# Patient Record
Sex: Female | Born: 1945
Health system: Southern US, Community
[De-identification: ages and names within clinical notes are randomized; demographics above are authoritative.]

## PROBLEM LIST (undated history)

## (undated) DIAGNOSIS — G43909 Migraine, unspecified, not intractable, without status migrainosus: Secondary | ICD-10-CM

## (undated) DIAGNOSIS — J189 Pneumonia, unspecified organism: Secondary | ICD-10-CM

## (undated) DIAGNOSIS — M1990 Unspecified osteoarthritis, unspecified site: Secondary | ICD-10-CM

## (undated) DIAGNOSIS — E0590 Thyrotoxicosis, unspecified without thyrotoxic crisis or storm: Secondary | ICD-10-CM

## (undated) DIAGNOSIS — R51 Headache: Secondary | ICD-10-CM

## (undated) DIAGNOSIS — Z8701 Personal history of pneumonia (recurrent): Secondary | ICD-10-CM

## (undated) DIAGNOSIS — Z8719 Personal history of other diseases of the digestive system: Secondary | ICD-10-CM

## (undated) DIAGNOSIS — C801 Malignant (primary) neoplasm, unspecified: Secondary | ICD-10-CM

## (undated) HISTORY — PX: ANKLE SURGERY: SHX546

## (undated) HISTORY — DX: Migraine, unspecified, not intractable, without status migrainosus: G43.909

## (undated) HISTORY — PX: BREAST EXCISIONAL BIOPSY: SUR124

## (undated) HISTORY — DX: Personal history of pneumonia (recurrent): Z87.01

## (undated) HISTORY — PX: MOUTH SURGERY: SHX715

## (undated) HISTORY — PX: CATARACT EXTRACTION, BILATERAL: SHX1313

## (undated) HISTORY — PX: OTHER SURGICAL HISTORY: SHX169

---

## 2003-03-27 HISTORY — PX: CHOLECYSTECTOMY: SHX55

## 2005-03-16 ENCOUNTER — Ambulatory Visit: Payer: Self-pay | Admitting: Internal Medicine

## 2005-04-09 ENCOUNTER — Ambulatory Visit: Payer: Self-pay | Admitting: Internal Medicine

## 2005-04-09 ENCOUNTER — Ambulatory Visit (HOSPITAL_COMMUNITY): Admission: RE | Admit: 2005-04-09 | Discharge: 2005-04-09 | Payer: Self-pay | Admitting: Internal Medicine

## 2007-12-25 ENCOUNTER — Ambulatory Visit: Payer: Self-pay | Admitting: Cardiology

## 2008-02-06 ENCOUNTER — Ambulatory Visit: Payer: Self-pay | Admitting: Cardiology

## 2011-08-03 ENCOUNTER — Encounter (INDEPENDENT_AMBULATORY_CARE_PROVIDER_SITE_OTHER): Payer: Self-pay | Admitting: *Deleted

## 2011-09-01 ENCOUNTER — Emergency Department: Payer: Self-pay | Admitting: Emergency Medicine

## 2013-09-11 ENCOUNTER — Encounter (INDEPENDENT_AMBULATORY_CARE_PROVIDER_SITE_OTHER): Payer: Self-pay | Admitting: *Deleted

## 2015-02-03 ENCOUNTER — Other Ambulatory Visit (INDEPENDENT_AMBULATORY_CARE_PROVIDER_SITE_OTHER): Payer: Self-pay | Admitting: *Deleted

## 2015-02-03 DIAGNOSIS — Z1211 Encounter for screening for malignant neoplasm of colon: Secondary | ICD-10-CM

## 2015-02-03 DIAGNOSIS — Z800 Family history of malignant neoplasm of digestive organs: Secondary | ICD-10-CM

## 2015-04-06 ENCOUNTER — Telehealth (INDEPENDENT_AMBULATORY_CARE_PROVIDER_SITE_OTHER): Payer: Self-pay | Admitting: *Deleted

## 2015-04-06 DIAGNOSIS — Z1211 Encounter for screening for malignant neoplasm of colon: Secondary | ICD-10-CM

## 2015-04-06 NOTE — Telephone Encounter (Signed)
Referring MD/PCP: bluth   Procedure: tcs  Reason/Indication:  Screening, fam hx colon ca  Has patient had this procedure before?  no  If so, when, by whom and where?    Is there a family history of colon cancer?  Yes, hald brother  Who?  What age when diagnosed?    Is patient diabetic?   no      Does patient have prosthetic heart valve or mechanical valve?  no  Do you have a pacemaker?  no  Has patient ever had endocarditis? no  Has patient had joint replacement within last 12 months?  no  Does patient tend to be constipated or take laxatives? no  Does patient have a history of alcohol/drug use?  no  Is patient on Coumadin, Plavix and/or Aspirin? ues  Medications: asa prn, synthroid 50 mcg daily  Allergies: sulfur drugs  Medication Adjustment: asa 2 days  Procedure date & time: 04/28/15 at 730

## 2015-04-06 NOTE — Telephone Encounter (Signed)
Patient needs trilyte 

## 2015-04-07 MED ORDER — PEG 3350-KCL-NA BICARB-NACL 420 G PO SOLR: 4000.0000 mL | Freq: Once | ORAL | Status: DC

## 2015-04-07 NOTE — Telephone Encounter (Signed)
agree

## 2015-04-28 ENCOUNTER — Encounter (HOSPITAL_COMMUNITY)
Admission: RE | Disposition: A | Discharge status: Home Health | Payer: Self-pay | Source: Ambulatory Visit | Attending: Internal Medicine

## 2015-04-28 ENCOUNTER — Encounter (HOSPITAL_COMMUNITY): Payer: Self-pay

## 2015-04-28 ENCOUNTER — Ambulatory Visit (HOSPITAL_COMMUNITY)
Admission: RE | Admit: 2015-04-28 | Discharge: 2015-04-28 | Disposition: A | Discharge status: Home Health | Payer: Medicare Other | Source: Ambulatory Visit | Attending: Internal Medicine | Admitting: Internal Medicine

## 2015-04-28 DIAGNOSIS — Z800 Family history of malignant neoplasm of digestive organs: Secondary | ICD-10-CM | POA: Diagnosis not present

## 2015-04-28 DIAGNOSIS — Z1211 Encounter for screening for malignant neoplasm of colon: Secondary | ICD-10-CM | POA: Diagnosis present

## 2015-04-28 DIAGNOSIS — E785 Hyperlipidemia, unspecified: Secondary | ICD-10-CM | POA: Diagnosis not present

## 2015-04-28 DIAGNOSIS — K5730 Diverticulosis of large intestine without perforation or abscess without bleeding: Secondary | ICD-10-CM | POA: Insufficient documentation

## 2015-04-28 DIAGNOSIS — E0590 Thyrotoxicosis, unspecified without thyrotoxic crisis or storm: Secondary | ICD-10-CM | POA: Insufficient documentation

## 2015-04-28 DIAGNOSIS — Z79899 Other long term (current) drug therapy: Secondary | ICD-10-CM | POA: Insufficient documentation

## 2015-04-28 HISTORY — DX: Headache: R51

## 2015-04-28 HISTORY — PX: COLONOSCOPY: SHX5424

## 2015-04-28 HISTORY — DX: Thyrotoxicosis, unspecified without thyrotoxic crisis or storm: E05.90

## 2015-04-28 SURGERY — COLONOSCOPY
Anesthesia: Moderate Sedation

## 2015-04-28 MED ORDER — MEPERIDINE HCL 50 MG/ML IJ SOLN
INTRAMUSCULAR | Status: DC | PRN
  Administered 2015-04-28 (×2): 25 mg via INTRAVENOUS

## 2015-04-28 MED ORDER — STERILE WATER FOR IRRIGATION IR SOLN
Status: DC | PRN
  Administered 2015-04-28: 08:00:00

## 2015-04-28 MED ORDER — MEPERIDINE HCL 50 MG/ML IJ SOLN
INTRAMUSCULAR | Status: AC
  Filled 2015-04-28: qty 1

## 2015-04-28 MED ORDER — SODIUM CHLORIDE 0.9 % IV SOLN
INTRAVENOUS | Status: DC
  Administered 2015-04-28: 07:00:00 via INTRAVENOUS

## 2015-04-28 MED ORDER — MIDAZOLAM HCL 5 MG/5ML IJ SOLN
INTRAMUSCULAR | Status: DC | PRN
  Administered 2015-04-28 (×5): 2 mg via INTRAVENOUS

## 2015-04-28 MED ORDER — MIDAZOLAM HCL 5 MG/5ML IJ SOLN
INTRAMUSCULAR | Status: AC
  Filled 2015-04-28: qty 10

## 2015-04-28 NOTE — H&P (Signed)
Amber Rivas is an 70 y.o. female.   Chief Complaint: Patient is here for colonoscopy. HPI: She is 70 year old Caucasian female who is here for screening examination. She denies abdominal pain change in bowel habits or rectal bleeding. This is patient's first exam. Her brother was diagnosed with rectal carcinoma last year rate 69. He required preop chemoradiation followed by surgery. Rolled Brother has been treated for prostate carcinoma and is doing fine.  Past Medical History  Diagnosis Date  . Hyperlipemia   . Headache   . Hyperthyroidism     Past Surgical History  Procedure Laterality Date  . Cholecystectomy  2005    Family History  Problem Relation Age of Onset  . Colon cancer Brother    Social History:  reports that she has never smoked. She does not have any smokeless tobacco history on file. She reports that she drinks alcohol. She reports that she does not use illicit drugs.  Allergies:  Allergies  Allergen Reactions  . Sulfur Anaphylaxis    Medications Prior to Admission  Medication Sig Dispense Refill  . levothyroxine (SYNTHROID, LEVOTHROID) 50 MCG tablet Take 50 mcg by mouth daily.    . Multiple Vitamin tablet Take 1 tablet by mouth daily.    . Omega-3 Fatty Acids (OMEGA 3 PO) Take 1 capsule by mouth 2 (two) times daily.    . polyethylene glycol-electrolytes (NULYTELY/GOLYTELY) 420 g solution Take 4,000 mLs by mouth once. 4000 mL 0  . meloxicam (MOBIC) 15 MG tablet Take 15 mg by mouth daily as needed. arthritis      No results found for this or any previous visit (from the past 48 hour(s)). No results found.  ROS  Blood pressure 144/77, pulse 82, temperature 98.6 F (37 C), temperature source Oral, resp. rate 14, height 5' 4.5" (1.638 m), weight 176 lb (79.833 kg), SpO2 97 %. Physical Exam  Constitutional: She appears well-developed and well-nourished.  HENT:  Mouth/Throat: Oropharynx is clear and moist.  Eyes: Conjunctivae are normal. No scleral icterus.   Neck: No thyromegaly present.  Cardiovascular: Normal rate, regular rhythm and normal heart sounds.   No murmur heard. Respiratory: Effort normal and breath sounds normal.  GI: Soft. She exhibits no distension and no mass. There is no tenderness.  Musculoskeletal: She exhibits no edema.  Lymphadenopathy:    She has no cervical adenopathy.  Neurological: She is alert.  Skin: Skin is warm and dry.     Assessment/Plan High risk screening colonoscopy. History of CRC in younger brother.  Rogene Houston, MD 04/28/2015, 7:36 AM

## 2015-04-28 NOTE — Discharge Instructions (Signed)
Resume usual medications and high fiber diet. No driving for 24 hours. Next screening exam in 5 years.  Colonoscopy, Care After Refer to this sheet in the next few weeks. These instructions provide you with information on caring for yourself after your procedure. Your health care provider may also give you more specific instructions. Your treatment has been planned according to current medical practices, but problems sometimes occur. Call your health care provider if you have any problems or questions after your procedure. WHAT TO EXPECT AFTER THE PROCEDURE  After your procedure, it is typical to have the following:  A small amount of blood in your stool.  Moderate amounts of gas and mild abdominal cramping or bloating. HOME CARE INSTRUCTIONS  Do not drive, operate machinery, or sign important documents for 24 hours.  You may shower and resume your regular physical activities, but move at a slower pace for the first 24 hours.  Take frequent rest periods for the first 24 hours.  Walk around or put a warm pack on your abdomen to help reduce abdominal cramping and bloating.  Drink enough fluids to keep your urine clear or pale yellow.  You may resume your normal diet as instructed by your health care provider. Avoid heavy or fried foods that are hard to digest.  Avoid drinking alcohol for 24 hours or as instructed by your health care provider.  Only take over-the-counter or prescription medicines as directed by your health care provider.  If a tissue sample (biopsy) was taken during your procedure:  Do not take aspirin or blood thinners for 7 days, or as instructed by your health care provider.  Do not drink alcohol for 7 days, or as instructed by your health care provider.  Eat soft foods for the first 24 hours. SEEK MEDICAL CARE IF: You have persistent spotting of blood in your stool 2-3 days after the procedure. SEEK IMMEDIATE MEDICAL CARE IF:  You have more than a small  spotting of blood in your stool.  You pass large blood clots in your stool.  Your abdomen is swollen (distended).  You have nausea or vomiting.  You have a fever.  You have increasing abdominal pain that is not relieved with medicine.   This information is not intended to replace advice given to you by your health care provider. Make sure you discuss any questions you have with your health care provider.   Diverticulosis Diverticulosis is the condition that develops when small pouches (diverticula) form in the wall of your colon. Your colon, or large intestine, is where water is absorbed and stool is formed. The pouches form when the inside layer of your colon pushes through weak spots in the outer layers of your colon. CAUSES  No one knows exactly what causes diverticulosis. RISK FACTORS  Being older than 72. Your risk for this condition increases with age. Diverticulosis is rare in people younger than 40 years. By age 18, almost everyone has it.  Eating a low-fiber diet.  Being frequently constipated.  Being overweight.  Not getting enough exercise.  Smoking.  Taking over-the-counter pain medicines, like aspirin and ibuprofen. SYMPTOMS  Most people with diverticulosis do not have symptoms. DIAGNOSIS  Because diverticulosis often has no symptoms, health care providers often discover the condition during an exam for other colon problems. In many cases, a health care provider will diagnose diverticulosis while using a flexible scope to examine the colon (colonoscopy). TREATMENT  If you have never developed an infection related to diverticulosis, you  may not need treatment. If you have had an infection before, treatment may include:  Eating more fruits, vegetables, and grains.  Taking a fiber supplement.  Taking a live bacteria supplement (probiotic).  Taking medicine to relax your colon. HOME CARE INSTRUCTIONS   Drink at least 6-8 glasses of water each day to prevent  constipation.  Try not to strain when you have a bowel movement.  Keep all follow-up appointments. If you have had an infection before:  Increase the fiber in your diet as directed by your health care provider or dietitian.  Take a dietary fiber supplement if your health care provider approves.  Only take medicines as directed by your health care provider. SEEK MEDICAL CARE IF:   You have abdominal pain.  You have bloating.  You have cramps.  You have not gone to the bathroom in 3 days. SEEK IMMEDIATE MEDICAL CARE IF:   Your pain gets worse.  Yourbloating becomes very bad.  You have a fever or chills, and your symptoms suddenly get worse.  You begin vomiting.  You have bowel movements that are bloody or black. MAKE SURE YOU:  Understand these instructions.  Will watch your condition.  Will get help right away if you are not doing well or get worse.   This information is not intended to replace advice given to you by your health care provider. Make sure you discuss any questions you have with your health care provider.   High-Fiber Diet Fiber, also called dietary fiber, is a type of carbohydrate found in fruits, vegetables, whole grains, and beans. A high-fiber diet can have many health benefits. Your health care provider may recommend a high-fiber diet to help: Prevent constipation. Fiber can make your bowel movements more regular. Lower your cholesterol. Relieve hemorrhoids, uncomplicated diverticulosis, or irritable bowel syndrome. Prevent overeating as part of a weight-loss plan. Prevent heart disease, type 2 diabetes, and certain cancers. WHAT IS MY PLAN? The recommended daily intake of fiber includes: 38 grams for men under age 63. 107 grams for men over age 40. 15 grams for women under age 84. 56 grams for women over age 13. You can get the recommended daily intake of dietary fiber by eating a variety of fruits, vegetables, grains, and beans. Your health  care provider may also recommend a fiber supplement if it is not possible to get enough fiber through your diet. WHAT DO I NEED TO KNOW ABOUT A HIGH-FIBER DIET? Fiber supplements have not been widely studied for their effectiveness, so it is better to get fiber through food sources. Always check the fiber content on thenutrition facts label of any prepackaged food. Look for foods that contain at least 5 grams of fiber per serving. Ask your dietitian if you have questions about specific foods that are related to your condition, especially if those foods are not listed in the following section. Increase your daily fiber consumption gradually. Increasing your intake of dietary fiber too quickly may cause bloating, cramping, or gas. Drink plenty of water. Water helps you to digest fiber. WHAT FOODS CAN I EAT? Grains Whole-grain breads. Multigrain cereal. Oats and oatmeal. Brown rice. Barley. Bulgur wheat. [place]. Bran muffins. Popcorn. Rye wafer crackers. Vegetables Sweet potatoes. Spinach. Kale. Artichokes. Cabbage. Broccoli. Green peas. Carrots. Squash. Fruits Berries. Pears. Apples. Oranges. Avocados. Prunes and raisins. Dried figs. Meats and Other Protein Sources [place], kidney, pinto, and soy beans. Split peas. Lentils. Nuts and seeds. Dairy Fiber-fortified yogurt. Beverages Fiber-fortified soy milk. Fiber-fortified orange juice. Other  Fiber bars. The items listed above may not be a complete list of recommended foods or beverages. Contact your dietitian for more options. WHAT FOODS ARE NOT RECOMMENDED? Grains White bread. Pasta made with refined flour. White rice. Vegetables Fried potatoes. Canned vegetables. Well-cooked vegetables.  Fruits Fruit juice. Cooked, strained fruit. Meats and Other Protein Sources Fatty cuts of meat. Fried Sales executive or fried fish. Dairy Milk. Yogurt. Cream cheese. Sour cream. Beverages Soft drinks. Other Cakes and pastries. Butter and oils. The items  listed above may not be a complete list of foods and beverages to avoid. Contact your dietitian for more information. WHAT ARE SOME TIPS FOR INCLUDING HIGH-FIBER FOODS IN MY DIET? Eat a wide variety of high-fiber foods. Make sure that half of all grains consumed each day are whole grains. Replace breads and cereals made from refined flour or white flour with whole-grain breads and cereals. Replace white rice with brown rice, bulgur wheat, or millet. Start the day with a breakfast that is high in fiber, such as a cereal that contains at least 5 grams of fiber per serving. Use beans in place of meat in soups, salads, or pasta. Eat high-fiber snacks, such as berries, raw vegetables, nuts, or popcorn.   This information is not intended to replace advice given to you by your health care provider. Make sure you discuss any questions you have with your health care provider.

## 2015-04-28 NOTE — Op Note (Signed)
COLONOSCOPY PROCEDURE REPORT  PATIENT:  Amber Rivas  MR#:  [id number] Birthdate:  03-15-1946, 70 y.o., female Endoscopist:  Dr. Rogene Houston, MD Referred By:  Dr. Celedonio Savage, MD Procedure Date: 04/28/2015  Procedure:   Colonoscopy  Indications:  Patient is 70 year old Caucasian female was undergoing HIDA screening colonoscopy. This is patient's first exam. Brother was diagnosed with rectal carcinoma at age 59 and required preop chemoradiation prior to APR.  Informed Consent:  The procedure and risks were reviewed with the patient and informed consent was obtained.  Medications:  Demerol 50 mg IV Versed 10 mg IV  First dose administered at  0742 Last dose administered at 0759  Description of procedure:  After a digital rectal exam was performed, that colonoscope was advanced from the anus through the rectum and colon to the area of the cecum, ileocecal valve and appendiceal orifice. The cecum was deeply intubated. These structures were well-seen and photographed for the record. From the level of the cecum and ileocecal valve, the scope was slowly and cautiously withdrawn. The mucosal surfaces were carefully surveyed utilizing scope tip to flexion to facilitate fold flattening as needed. The scope was pulled down into the rectum where a thorough exam including retroflexion was performed. Terminal ileum was also examined.  Findings:   Prep excellent. Normal mucosa of terminal ileum. Normal mucosa of cecum, ascending colon, hepatic flexure, transverse colon, splenic flexure and descending colon. Few small diverticula at sigmoid colon. Normal rectal mucosa and anorectal junction.    Therapeutic/Diagnostic Maneuvers Performed:   None  Complications:  None  EBL: None  Cecal Withdrawal Time:  8 minutes  Impression:  Normal mucosa of terminal ileum. Few small diverticula at sigmoid colon otherwise normal colonoscopy.  Recommendations:  Standard instructions given. High fiber  diet. Next colonoscopy in 5 years.  REHMAN,NAJEEB U  04/28/2015 8:12 AM  CC: Dr. Celedonio Savage, MD & Dr. Rayne Du ref. provider found

## 2015-05-02 ENCOUNTER — Encounter (HOSPITAL_COMMUNITY): Payer: Self-pay | Admitting: Internal Medicine

## 2015-07-27 ENCOUNTER — Ambulatory Visit (HOSPITAL_COMMUNITY): Payer: Medicare Other | Admitting: Clinical

## 2015-08-11 ENCOUNTER — Ambulatory Visit (INDEPENDENT_AMBULATORY_CARE_PROVIDER_SITE_OTHER): Payer: Medicare Other | Admitting: Internal Medicine

## 2015-08-11 ENCOUNTER — Encounter (HOSPITAL_COMMUNITY): Payer: Self-pay | Admitting: Emergency Medicine

## 2015-08-11 ENCOUNTER — Emergency Department (HOSPITAL_COMMUNITY)
Admission: EM | Admit: 2015-08-11 | Discharge: 2015-08-11 | Disposition: A | Discharge status: Home / Self Care | Payer: Medicare Other | Attending: Emergency Medicine | Admitting: Emergency Medicine

## 2015-08-11 ENCOUNTER — Emergency Department (HOSPITAL_COMMUNITY): Payer: Medicare Other

## 2015-08-11 DIAGNOSIS — R197 Diarrhea, unspecified: Secondary | ICD-10-CM | POA: Diagnosis not present

## 2015-08-11 DIAGNOSIS — E785 Hyperlipidemia, unspecified: Secondary | ICD-10-CM | POA: Diagnosis not present

## 2015-08-11 DIAGNOSIS — R51 Headache: Secondary | ICD-10-CM | POA: Diagnosis present

## 2015-08-11 DIAGNOSIS — R1010 Upper abdominal pain, unspecified: Secondary | ICD-10-CM | POA: Insufficient documentation

## 2015-08-11 DIAGNOSIS — G43909 Migraine, unspecified, not intractable, without status migrainosus: Secondary | ICD-10-CM | POA: Diagnosis not present

## 2015-08-11 DIAGNOSIS — Z79899 Other long term (current) drug therapy: Secondary | ICD-10-CM | POA: Diagnosis not present

## 2015-08-11 DIAGNOSIS — R112 Nausea with vomiting, unspecified: Secondary | ICD-10-CM | POA: Diagnosis not present

## 2015-08-11 DIAGNOSIS — Z9049 Acquired absence of other specified parts of digestive tract: Secondary | ICD-10-CM | POA: Diagnosis not present

## 2015-08-11 DIAGNOSIS — R1110 Vomiting, unspecified: Secondary | ICD-10-CM

## 2015-08-11 LAB — COMPREHENSIVE METABOLIC PANEL
ALK PHOS: 65 U/L (ref 38–126)
ALT: 34 U/L (ref 14–54)
AST: 25 U/L (ref 15–41)
Albumin: 3.9 g/dL (ref 3.5–5.0)
Anion gap: 7 (ref 5–15)
BILIRUBIN TOTAL: 0.5 mg/dL (ref 0.3–1.2)
BUN: 10 mg/dL (ref 6–20)
CALCIUM: 8.4 mg/dL — AB (ref 8.9–10.3)
CHLORIDE: 100 mmol/L — AB (ref 101–111)
CO2: 27 mmol/L (ref 22–32)
CREATININE: 0.53 mg/dL (ref 0.44–1.00)
Glucose, Bld: 106 mg/dL — ABNORMAL HIGH (ref 65–99)
Potassium: 3.4 mmol/L — ABNORMAL LOW (ref 3.5–5.1)
Sodium: 134 mmol/L — ABNORMAL LOW (ref 135–145)
Total Protein: 7.1 g/dL (ref 6.5–8.1)

## 2015-08-11 LAB — URINALYSIS, ROUTINE W REFLEX MICROSCOPIC
Bilirubin Urine: NEGATIVE
GLUCOSE, UA: NEGATIVE mg/dL
HGB URINE DIPSTICK: NEGATIVE
KETONES UR: NEGATIVE mg/dL
LEUKOCYTES UA: NEGATIVE
Nitrite: NEGATIVE
PROTEIN: NEGATIVE mg/dL
Specific Gravity, Urine: <1.005 — ABNORMAL LOW (ref 1.005–1.030)
pH: 6.5 (ref 5.0–8.0)

## 2015-08-11 LAB — CBC
HCT: 38.6 % (ref 36.0–46.0)
Hemoglobin: 13 g/dL (ref 12.0–15.0)
MCH: 29.6 pg (ref 26.0–34.0)
MCHC: 33.7 g/dL (ref 30.0–36.0)
MCV: 87.9 fL (ref 78.0–100.0)
PLATELETS: 306 10*3/uL (ref 150–400)
RBC: 4.39 MIL/uL (ref 3.87–5.11)
RDW: 13.5 % (ref 11.5–15.5)
WBC: 6.9 10*3/uL (ref 4.0–10.5)

## 2015-08-11 LAB — LIPASE, BLOOD: LIPASE: 17 U/L (ref 11–51)

## 2015-08-11 MED ORDER — OMEPRAZOLE 20 MG PO CPDR: 20.0000 mg | DELAYED_RELEASE_CAPSULE | Freq: Every day | ORAL | Status: DC

## 2015-08-11 MED ORDER — SODIUM CHLORIDE 0.9 % IV BOLUS (SEPSIS)
1000.0000 mL | Freq: Once | INTRAVENOUS | Status: AC
  Administered 2015-08-11: 1000 mL via INTRAVENOUS

## 2015-08-11 MED ORDER — PROCHLORPERAZINE EDISYLATE 5 MG/ML IJ SOLN
10.0000 mg | Freq: Once | INTRAMUSCULAR | Status: AC
  Administered 2015-08-11: 10 mg via INTRAVENOUS
  Filled 2015-08-11: qty 2

## 2015-08-11 MED ORDER — ONDANSETRON HCL 4 MG PO TABS: 4.0000 mg | ORAL_TABLET | Freq: Three times a day (TID) | ORAL | Status: DC | PRN

## 2015-08-11 MED ORDER — HYDROMORPHONE HCL 1 MG/ML IJ SOLN
0.5000 mg | INTRAMUSCULAR | Status: DC | PRN
Start: 2015-08-11 — End: 2015-08-11
  Administered 2015-08-11: 0.5 mg via INTRAVENOUS
  Filled 2015-08-11: qty 1

## 2015-08-11 MED ORDER — SODIUM CHLORIDE 0.9 % IV SOLN: INTRAVENOUS | Status: DC

## 2015-08-11 MED ORDER — SUMATRIPTAN SUCCINATE 50 MG PO TABS: 50.0000 mg | ORAL_TABLET | ORAL | Status: DC | PRN

## 2015-08-11 NOTE — ED Notes (Signed)
PT c/o upper abdominal pain with n/v/d x4 days. PT c/o headache x1 day as well with generalized weakness and malaise. PT denies any urinary symptoms.

## 2015-08-11 NOTE — Discharge Instructions (Signed)
Migraine Headache A migraine headache is an intense, throbbing pain on one or both sides of your head. A migraine can last for 30 minutes to several hours. CAUSES  The exact cause of a migraine headache is not always known. However, a migraine may be caused when nerves in the brain become irritated and release chemicals that cause inflammation. This causes pain. Certain things may also trigger migraines, such as:  Alcohol.  Smoking.  Stress.  Menstruation.  Aged cheeses.  Foods or drinks that contain nitrates, glutamate, aspartame, or tyramine.  Lack of sleep.  Chocolate.  Caffeine.  Hunger.  Physical exertion.  Fatigue.  Medicines used to treat chest pain (nitroglycerine), birth control pills, estrogen, and some blood pressure medicines. SIGNS AND SYMPTOMS  Pain on one or both sides of your head.  Pulsating or throbbing pain.  Severe pain that prevents daily activities.  Pain that is aggravated by any physical activity.  Nausea, vomiting, or both.  Dizziness.  Pain with exposure to bright lights, loud noises, or activity.  General sensitivity to bright lights, loud noises, or smells. Before you get a migraine, you may get warning signs that a migraine is coming (aura). An aura may include:  Seeing flashing lights.  Seeing bright spots, halos, or zigzag lines.  Having tunnel vision or blurred vision.  Having feelings of numbness or tingling.  Having trouble talking.  Having muscle weakness. DIAGNOSIS  A migraine headache is often diagnosed based on:  Symptoms.  Physical exam.  A CT scan or MRI of your head. These imaging tests cannot diagnose migraines, but they can help rule out other causes of headaches. TREATMENT Medicines may be given for pain and nausea. Medicines can also be given to help prevent recurrent migraines.  HOME CARE INSTRUCTIONS  Only take over-the-counter or prescription medicines for pain or discomfort as directed by your  health care provider. The use of long-term narcotics is not recommended.  Lie down in a dark, quiet room when you have a migraine.  Keep a journal to find out what may trigger your migraine headaches. For example, write down:  What you eat and drink.  How much sleep you get.  Any change to your diet or medicines.  Limit alcohol consumption.  Quit smoking if you smoke.  Get 7-9 hours of sleep, or as recommended by your health care provider.  Limit stress.  Keep lights dim if bright lights bother you and make your migraines worse. SEEK IMMEDIATE MEDICAL CARE IF:   Your migraine becomes severe.  You have a fever.  You have a stiff neck.  You have vision loss.  You have muscular weakness or loss of muscle control.  You start losing your balance or have trouble walking.  You feel faint or pass out.  You have severe symptoms that are different from your first symptoms. MAKE SURE YOU:   Understand these instructions.  Will watch your condition.  Will get help right away if you are not doing well or get worse.   This information is not intended to replace advice given to you by your health care provider. Make sure you discuss any questions you have with your health care provider.   Document Released: 03/12/2005 Document Revised: 04/02/2014 Document Reviewed: 11/17/2012 Elsevier Interactive Patient Education 2016 [employer]  Nausea and Vomiting Nausea is a sick feeling that often comes before throwing up (vomiting). Vomiting is a reflex where stomach contents come out of your mouth. Vomiting can cause severe loss of body  fluids (dehydration). Children and elderly adults can become dehydrated quickly, especially if they also have diarrhea. Nausea and vomiting are symptoms of a condition or disease. It is important to find the cause of your symptoms. CAUSES   Direct irritation of the stomach lining. This irritation can result from increased acid production  (gastroesophageal reflux disease), infection, food poisoning, taking certain medicines (such as nonsteroidal anti-inflammatory drugs), alcohol use, or tobacco use.  Signals from the brain.These signals could be caused by a headache, heat exposure, an inner ear disturbance, increased pressure in the brain from injury, infection, a tumor, or a concussion, pain, emotional stimulus, or metabolic problems.  An obstruction in the gastrointestinal tract (bowel obstruction).  Illnesses such as diabetes, hepatitis, gallbladder problems, appendicitis, kidney problems, cancer, sepsis, atypical symptoms of a heart attack, or eating disorders.  Medical treatments such as chemotherapy and radiation.  Receiving medicine that makes you sleep (general anesthetic) during surgery. DIAGNOSIS Your caregiver may ask for tests to be done if the problems do not improve after a few days. Tests may also be done if symptoms are severe or if the reason for the nausea and vomiting is not clear. Tests may include:  Urine tests.  Blood tests.  Stool tests.  Cultures (to look for evidence of infection).  X-rays or other imaging studies. Test results can help your caregiver make decisions about treatment or the need for additional tests. TREATMENT You need to stay well hydrated. Drink frequently but in small amounts.You may wish to drink water, sports drinks, clear broth, or eat frozen ice pops or gelatin dessert to help stay hydrated.When you eat, eating slowly may help prevent nausea.There are also some antinausea medicines that may help prevent nausea. HOME CARE INSTRUCTIONS   Take all medicine as directed by your caregiver.  If you do not have an appetite, do not force yourself to eat. However, you must continue to drink fluids.  If you have an appetite, eat a normal diet unless your caregiver tells you differently.  Eat a variety of complex carbohydrates (rice, wheat, potatoes, bread), lean meats, yogurt,  fruits, and vegetables.  Avoid high-fat foods because they are more difficult to digest.  Drink enough water and fluids to keep your urine clear or pale yellow.  If you are dehydrated, ask your caregiver for specific rehydration instructions. Signs of dehydration may include:  Severe thirst.  Dry lips and mouth.  Dizziness.  Dark urine.  Decreasing urine frequency and amount.  Confusion.  Rapid breathing or pulse. SEEK IMMEDIATE MEDICAL CARE IF:   You have blood or brown flecks (like coffee grounds) in your vomit.  You have black or bloody stools.  You have a severe headache or stiff neck.  You are confused.  You have severe abdominal pain.  You have chest pain or trouble breathing.  You do not urinate at least once every 8 hours.  You develop cold or clammy skin.  You continue to vomit for longer than 24 to 48 hours.  You have a fever. MAKE SURE YOU:   Understand these instructions.  Will watch your condition.  Will get help right away if you are not doing well or get worse.   This information is not intended to replace advice given to you by your health care provider. Make sure you discuss any questions you have with your health care provider.   Document Released: 03/12/2005 Document Revised: 06/04/2011 Document Reviewed: 08/09/2010 Elsevier Interactive Patient Education [employer].

## 2015-08-11 NOTE — ED Provider Notes (Signed)
CSN: [id number]     Arrival date & time 08/11/15  1131 History  By signing my name below, I, Nicole Kindred, attest that this documentation has been prepared under the direction and in the presence of Dorie Rank, MD.   Electronically Signed: Nicole Kindred, ED Scribe. 08/11/2015. 2:34 PM   Chief Complaint  Patient presents with  . Abdominal Pain   The history is provided by the patient. No language interpreter was used.   HPI Comments: Amber Rivas is a 70 y.o. female with PMHx of hyperlipemia, hyperthyroidism, and headaches for 15 years (approx 2-3 per week)  who presents to the Emergency Department complaining of gradual onset, intermittent, upper abdominal pain, ongoing for five days. Pt reports associated diarrhea, nausea, emesis x3-4 episodes last night, generalized weakness, and malaise. She also complains headache which presents differently than her chronic headaches. No other associated symptoms noted. Pt took tylenol PTA with some relief to her headache. No other worsening or alleviating factors noted. Pt denies recent travel, recent antibiotics, insect bites, or any other pertinent symptoms.   Past Medical History  Diagnosis Date  . Hyperlipemia   . Headache   . Hyperthyroidism    Past Surgical History  Procedure Laterality Date  . Cholecystectomy  2005  . Colonoscopy N/A 04/28/2015    Procedure: COLONOSCOPY;  Surgeon: Rogene Houston, MD;  Location: [facility];  Service: Endoscopy;  Laterality: N/A;  730   Family History  Problem Relation Age of Onset  . Colon cancer Brother    Social History  Substance Use Topics  . Smoking status: Never Smoker   . Smokeless tobacco: None  . Alcohol Use: Yes   OB History    No data available     Review of Systems  Constitutional:       Generalized weakness and malaise.   Gastrointestinal: Positive for nausea, vomiting, abdominal pain and diarrhea.  Neurological: Positive for headaches.  All other systems reviewed and are  negative. A complete 10 system review of systems was obtained and all systems are negative except as noted in the HPI and PMH.    Allergies  Sulfur  Home Medications   Prior to Admission medications   Medication Sig Start Date End Date Taking? Authorizing Provider  levothyroxine (SYNTHROID, LEVOTHROID) 50 MCG tablet Take 50 mcg by mouth daily. 01/13/15  Yes Historical Provider, MD  LORazepam (ATIVAN) 0.5 MG tablet Take 0.5 mg by mouth daily as needed (migrane).  06/29/15  Yes Historical Provider, MD  Multiple Vitamin tablet Take 1 tablet by mouth daily.   Yes Historical Provider, MD  Omega-3 Fatty Acids (OMEGA 3 PO) Take 1 capsule by mouth 2 (two) times daily.   Yes Historical Provider, MD  meloxicam (MOBIC) 15 MG tablet Take 15 mg by mouth daily as needed. arthritis 01/13/15   Historical Provider, MD  omeprazole (PRILOSEC) 20 MG capsule Take 1 capsule (20 mg total) by mouth daily. 08/11/15   Dorie Rank, MD  ondansetron (ZOFRAN) 4 MG tablet Take 1 tablet (4 mg total) by mouth every 8 (eight) hours as needed for nausea or vomiting. 08/11/15   Dorie Rank, MD  SUMAtriptan (IMITREX) 50 MG tablet Take 1 tablet (50 mg total) by mouth every 2 (two) hours as needed for migraine (max 100 mg per 24 hours). May repeat in 2 hours if headache persists or recurs. 08/11/15   Dorie Rank, MD   BP 134/81 mmHg  Pulse 86  Temp(Src) 98.2 F (36.8 C) (Oral)  Resp 18  Ht 5\' 4"  (1.626 m)  Wt 185 lb (83.915 kg)  BMI 31.74 kg/m2  SpO2 98% Physical Exam  Constitutional: She appears well-developed and well-nourished. No distress.  HENT:  Head: Normocephalic and atraumatic.  Right Ear: External ear normal.  Left Ear: External ear normal.  Eyes: Conjunctivae are normal. Right eye exhibits no discharge. Left eye exhibits no discharge. No scleral icterus.  Neck: Neck supple. No tracheal deviation present.  Cardiovascular: Normal rate, regular rhythm and intact distal pulses.   Pulmonary/Chest: Effort normal and breath  sounds normal. No stridor. No respiratory distress. She has no wheezes. She has no rales.  Abdominal: Soft. Bowel sounds are normal. She exhibits no distension and no mass. There is no tenderness. There is no rebound and no guarding. No hernia.  Musculoskeletal: She exhibits no edema or tenderness.  Neurological: She is alert. She has normal strength. No cranial nerve deficit (no facial droop, extraocular movements intact, no slurred speech) or sensory deficit. She exhibits normal muscle tone. She displays no seizure activity. Coordination normal.  Skin: Skin is warm and dry. No rash noted.  Psychiatric: She has a normal mood and affect.  Nursing note and vitals reviewed.   ED Course  Procedures (including critical care time) DIAGNOSTIC STUDIES: Oxygen Saturation is 98% on RA, normal by my interpretation.    COORDINATION OF CARE: 12:19 PM Discussed treatment plan with pt at bedside and pt agreed to plan.  Medications  sodium chloride 0.9 % bolus 1,000 mL (1,000 mLs Intravenous New Bag/Given 08/11/15 1241)    And  0.9 %  sodium chloride infusion (not administered)  HYDROmorphone (DILAUDID) injection 0.5 mg (0.5 mg Intravenous Given 08/11/15 1240)  prochlorperazine (COMPAZINE) injection 10 mg (10 mg Intravenous Given 08/11/15 1240)    Labs Review Labs Reviewed  COMPREHENSIVE METABOLIC PANEL - Abnormal; Notable for the following:    Sodium 134 (*)    Potassium 3.4 (*)    Chloride 100 (*)    Glucose, Bld 106 (*)    Calcium 8.4 (*)    All other components within normal limits  URINALYSIS, ROUTINE W REFLEX MICROSCOPIC (NOT AT [facility]) - Abnormal; Notable for the following:    Specific Gravity, Urine <1.005 (*)    All other components within normal limits  LIPASE, BLOOD  CBC    Imaging Review Dg Abd Acute W/chest  08/11/2015  CLINICAL DATA:  70 year old female with a history of nausea EXAM: DG ABDOMEN ACUTE W/ 1V CHEST COMPARISON:  None. FINDINGS: Chest: Cardiomediastinal silhouette  within normal limits. Asymmetric elevation of the right hemidiaphragm compared to the left. Linear opacities at the base of the right lung. No pneumothorax or pleural effusion. No confluent airspace disease. Abdomen: Gas within stomach, small bowel, colon. No abnormal distention. No significant stool burden. Surgical changes of cholecystectomy. Overlying EKG leads. No unexpected soft tissue density. Pseudoarticulation of the left L5 transverse process with the sacrum. No displaced fracture. Degenerative changes of the lower lumbar spine and bilateral hips. Pelvic vascular calcifications. IMPRESSION: Chest: No radiographic evidence of acute cardiopulmonary disease. Abdomen: Normal bowel gas pattern. Surgical changes of cholecystectomy Signed, Dulcy Fanny. Earleen Newport, DO [facility] Electronically Signed   By: Corrie Mckusick D.O.   On: 08/11/2015 13:33   I have personally reviewed and evaluated these images and lab results as part of my medical decision-making.    MDM   Final diagnoses:  Vomiting and diarrhea  Migraine without status migrainosus, not intractable, unspecified  migraine type   I suspect the patient's symptoms may be related to a viral type gastroenteritis considering the vomiting and the diarrhea. Her laboratory tests and x-rays are unremarkable. I doubt obstruction and colitis, diverticulitis or appendicitis.  It is Possible there could be a component of gastritis . she has been having these symptoms for several weeks. She was actually supposed to see a gastroenterologist today but didn't feel well and so she came to the emergency room. Patient has been taking a lot of NSAIDs for her migraine type headaches.  I will give her a prescription for Imitrex, Prilosec and Zofran. Instructed to follow-up with her gastroenterologist. I also recommended she follow up with her primary doctor or neurologist to discuss further treatment of her  recurrent migraine headaches.  Patient's symptoms improved with treatment in the emergency room.  I personally performed the services described in this documentation, which was scribed in my presence.  The recorded information has been reviewed and is accurate.     Dorie Rank, MD 08/11/15 [phone]

## 2015-08-25 ENCOUNTER — Ambulatory Visit (INDEPENDENT_AMBULATORY_CARE_PROVIDER_SITE_OTHER): Payer: Medicare Other | Admitting: Internal Medicine

## 2015-08-25 ENCOUNTER — Encounter (INDEPENDENT_AMBULATORY_CARE_PROVIDER_SITE_OTHER): Payer: Self-pay | Admitting: Internal Medicine

## 2016-04-30 ENCOUNTER — Emergency Department (HOSPITAL_COMMUNITY)
Admission: EM | Admit: 2016-04-30 | Discharge: 2016-05-01 | Disposition: A | Discharge status: Home / Self Care | Payer: Medicare Other | Attending: Emergency Medicine | Admitting: Emergency Medicine

## 2016-04-30 ENCOUNTER — Emergency Department (HOSPITAL_COMMUNITY): Payer: Medicare Other

## 2016-04-30 ENCOUNTER — Encounter (HOSPITAL_COMMUNITY): Payer: Self-pay

## 2016-04-30 DIAGNOSIS — R5383 Other fatigue: Secondary | ICD-10-CM | POA: Insufficient documentation

## 2016-04-30 DIAGNOSIS — R0602 Shortness of breath: Secondary | ICD-10-CM | POA: Insufficient documentation

## 2016-04-30 DIAGNOSIS — Z79899 Other long term (current) drug therapy: Secondary | ICD-10-CM | POA: Insufficient documentation

## 2016-04-30 DIAGNOSIS — Z7982 Long term (current) use of aspirin: Secondary | ICD-10-CM | POA: Insufficient documentation

## 2016-04-30 DIAGNOSIS — R531 Weakness: Secondary | ICD-10-CM

## 2016-04-30 LAB — CBC WITH DIFFERENTIAL/PLATELET
BASOS ABS: 0 10*3/uL (ref 0.0–0.1)
BASOS PCT: 0 %
EOS ABS: 0.4 10*3/uL (ref 0.0–0.7)
Eosinophils Relative: 4 %
HCT: 37.2 % (ref 36.0–46.0)
HEMOGLOBIN: 12.7 g/dL (ref 12.0–15.0)
Lymphocytes Relative: 29 %
Lymphs Abs: 2.7 10*3/uL (ref 0.7–4.0)
MCH: 30.2 pg (ref 26.0–34.0)
MCHC: 34.1 g/dL (ref 30.0–36.0)
MCV: 88.6 fL (ref 78.0–100.0)
Monocytes Absolute: 0.7 10*3/uL (ref 0.1–1.0)
Monocytes Relative: 7 %
NEUTROS PCT: 60 %
Neutro Abs: 5.4 10*3/uL (ref 1.7–7.7)
PLATELETS: 339 10*3/uL (ref 150–400)
RBC: 4.2 MIL/uL (ref 3.87–5.11)
RDW: 13.5 % (ref 11.5–15.5)
WBC: 9.1 10*3/uL (ref 4.0–10.5)

## 2016-04-30 LAB — BASIC METABOLIC PANEL
Anion gap: 8 (ref 5–15)
BUN: 19 mg/dL (ref 6–20)
CHLORIDE: 100 mmol/L — AB (ref 101–111)
CO2: 25 mmol/L (ref 22–32)
Calcium: 9.1 mg/dL (ref 8.9–10.3)
Creatinine, Ser: 0.64 mg/dL (ref 0.44–1.00)
Glucose, Bld: 94 mg/dL (ref 65–99)
POTASSIUM: 3.9 mmol/L (ref 3.5–5.1)
SODIUM: 133 mmol/L — AB (ref 135–145)

## 2016-04-30 LAB — TROPONIN I

## 2016-04-30 LAB — D-DIMER, QUANTITATIVE (NOT AT ARMC): D DIMER QUANT: 1.55 ug{FEU}/mL — AB (ref 0.00–0.50)

## 2016-04-30 MED ORDER — IOPAMIDOL (ISOVUE-370) INJECTION 76%
100.0000 mL | Freq: Once | INTRAVENOUS | Status: AC | PRN
  Administered 2016-04-30: 100 mL via INTRAVENOUS

## 2016-04-30 NOTE — ED Triage Notes (Signed)
Patient states that she is having shortness of breath and weakness that has started since having surgery on her right ankle last Tuesday morning.  Patient states that when she gets up and walks to the bathroom she becomes short of breath before she gets back to the other room to sit down.  States that she is feeling weak.  Concerned that she may have a blood clot.  She talked to the PA at the ortho MD and they told her to come in to be evaluated.

## 2016-04-30 NOTE — ED Provider Notes (Signed)
[facility] DEPT Provider Note   CSN: [id number] Arrival date & time: 04/30/16  1934    By signing my name below, I, Macon Large, attest that this documentation has been prepared under the direction and in the presence of Nat Christen, MD. Electronically Signed: Macon Large, ED Scribe. 04/30/16. 10:39 PM.  History   Chief Complaint Chief Complaint  Patient presents with  . Shortness of Breath   The history is provided by the patient and the spouse. No language interpreter was used.   HPI Comments: Amber Rivas is a 71 y.o. female who presents to the Emergency Department complaining of moderate, intermittent, SOB on exertion onset a couple of days ago. Pt reports associated weakness, fatigue, and subjective fever. Pt's temperature in the ED today was 98.2. Pt's spouse notes she had an elevated BP 155/98 at home. She reports taking a baby aspirin (21 mg) twice a day. She is followed up by Dr. Celedonio Savage in [place]. She denies CP. Pt notes she had right ankle surgery 6 days ago by Dr. Chrystie Nose. Hewitt in [place]. She is currently using a knee walker for ambulation assistance.   Past Medical History:  Diagnosis Date  . Headache   . Hyperlipemia   . Hyperthyroidism     There are no active problems to display for this patient.   Past Surgical History:  Procedure Laterality Date  . CHOLECYSTECTOMY  2005  . COLONOSCOPY N/A 04/28/2015   Procedure: COLONOSCOPY;  Surgeon: Rogene Houston, MD;  [facility];  Service: Endoscopy;  Laterality: N/A;  730  . right ankle surgery      OB History    No data available       Home Medications    Prior to Admission medications   Medication Sig Start Date End Date Taking? Authorizing Provider  acetaminophen (TYLENOL) 500 MG tablet Take 500 mg by mouth every 6 (six) hours as needed for mild pain or moderate pain.   Yes Historical Provider, MD  aspirin EC 81 MG tablet Take 162 mg by mouth once as needed for mild pain or  moderate pain.   Yes Historical Provider, MD  B-COMPLEX-C PO Take 1 tablet by mouth daily.   Yes Historical Provider, MD  levothyroxine (SYNTHROID, LEVOTHROID) 50 MCG tablet Take 50 mcg by mouth daily. 01/13/15  Yes Historical Provider, MD  LORazepam (ATIVAN) 0.5 MG tablet Take 0.5 mg by mouth daily as needed (migrane).  06/29/15  Yes Historical Provider, MD  Multiple Vitamin (MULTIVITAMIN WITH MINERALS) TABS tablet Take 1 tablet by mouth daily.   Yes Historical Provider, MD  naproxen (NAPROSYN) 500 MG tablet Take 500 mg by mouth daily as needed for mild pain.   Yes Historical Provider, MD  naproxen sodium (ALEVE) 220 MG tablet Take 220 mg by mouth daily as needed (for pain).   Yes Historical Provider, MD  Omega-3 Fatty Acids (OMEGA 3 PO) Take 1 capsule by mouth 2 (two) times daily.   Yes Historical Provider, MD  SUMAtriptan (IMITREX) 50 MG tablet Take 1 tablet (50 mg total) by mouth every 2 (two) hours as needed for migraine (max 100 mg per 24 hours). May repeat in 2 hours if headache persists or recurs. 08/11/15  Yes Dorie Rank, MD  TURMERIC PO Take 1 tablet by mouth daily.   Yes Historical Provider, MD  ondansetron (ZOFRAN) 4 MG tablet Take 1 tablet (4 mg total) by mouth every 8 (eight) hours as needed for nausea or vomiting. 08/11/15  Dorie Rank, MD    Family History Family History  Problem Relation Age of Onset  . Colon cancer Brother     Social History Social History  Substance Use Topics  . Smoking status: Never Smoker  . Smokeless tobacco: Never Used  . Alcohol use Yes     Allergies   Sulfur   Review of Systems Review of Systems  Respiratory: Positive for shortness of breath.   All other systems reviewed and are negative.   A complete 10 system review of systems was obtained and all systems are negative except as noted in the HPI and PMH.   Physical Exam Updated Vital Signs BP 128/70 (BP Location: Right Arm)   Pulse 72   Temp 98.2 F (36.8 C) (Oral)   Resp 22   Ht 5'  5.5" (1.664 m)   Wt 177 lb (80.3 kg)   SpO2 96%   BMI 29.01 kg/m   Physical Exam  Constitutional: She is oriented to person, place, and time. She appears well-developed and well-nourished.  HENT:  Head: Normocephalic and atraumatic.  Eyes: Conjunctivae are normal.  Neck: Neck supple.  Cardiovascular: Normal rate and regular rhythm.   Pulmonary/Chest: Effort normal and breath sounds normal.  Abdominal: Soft. Bowel sounds are normal.  Musculoskeletal: Normal range of motion.  Right ankle cast   Neurological: She is alert and oriented to person, place, and time.  Skin: Skin is warm and dry.  Psychiatric: She has a normal mood and affect. Her behavior is normal.  Nursing note and vitals reviewed.    ED Treatments / Results   DIAGNOSTIC STUDIES: Oxygen Saturation is 99% on RA, normal by my interpretation.    COORDINATION OF CARE: 10:35 PM Discussed treatment plan with pt at bedside which includes labs, chest CT angiogram and EKG and pt agreed to plan.   Labs (all labs ordered are listed, but only abnormal results are displayed) Labs Reviewed  BASIC METABOLIC PANEL - Abnormal; Notable for the following:       Result Value   Sodium 133 (*)    Chloride 100 (*)    All other components within normal limits  D-DIMER, QUANTITATIVE (NOT AT [facility]) - Abnormal; Notable for the following:    D-Dimer, Quant 1.55 (*)    All other components within normal limits  CBC WITH DIFFERENTIAL/PLATELET  TROPONIN I    EKG  EKG Interpretation None       Radiology Dg Chest 2 View  Result Date: 04/30/2016 CLINICAL DATA:  Shortness of breath and weakness. EXAM: CHEST  2 VIEW COMPARISON:  08/11/2015 FINDINGS: Stable asymmetric elevation right hemidiaphragm. The cardiopericardial silhouette is within normal limits for size. New 5 mm nodular density identified left upper lobe. Streaky opacity at the lung bases again noted, compatible with chronic atelectasis or scarring. The visualized bony  structures of the thorax are intact. IMPRESSION: 1. New 5 mm left upper lobe nodule. CT chest without contrast recommended to further evaluate. 2. Stable asymmetric elevation right hemidiaphragm. 3. Bibasilar chronic atelectasis scarring. Electronically Signed   By: Misty Stanley M.D.   On: 04/30/2016 20:18   Ct Angio Chest Pe W And/or Wo Contrast  Result Date: 04/30/2016 CLINICAL DATA:  Moderate intermittent shortness of breath on exertion for 2 days. Weakness, fatigue, and fever. Right ankle surgery 6 days ago. EXAM: CT ANGIOGRAPHY CHEST WITH CONTRAST TECHNIQUE: Multidetector CT imaging of the chest was performed using the standard protocol during bolus administration of intravenous contrast. Multiplanar CT image reconstructions and  MIPs were obtained to evaluate the vascular anatomy. CONTRAST:  100 mL Isovue 370 COMPARISON:  None. FINDINGS: Cardiovascular: Good opacification of the central and segmental pulmonary arteries. No focal filling defects demonstrated. No evidence of significant pulmonary embolus. Normal heart size. No pericardial effusion. Scattered calcification in the aorta. No aortic aneurysm or dissection. Mediastinum/Nodes: Small esophageal hiatal hernia. Esophagus is decompressed. No significant lymphadenopathy in the chest. Mediastinal lymph nodes are not pathologically enlarged. There is diffuse enlargement of the left lobe of the thyroid, measuring up to 2.6 x 3.7 cm. Suggest follow-up with ultrasound for further evaluation. Lungs/Pleura: The left upper lobe nodule seen on radiograph is not identified at CT, likely representing superimposed structures. No focal airspace disease or consolidation in the lungs. Mild dependent atelectasis. No pleural effusions. No pneumothorax. Airways are patent. Upper Abdomen: Subcentimeter cyst in the liver. Surgical absence of the gallbladder. No bile duct dilatation. No acute process identified in the upper abdomen. Musculoskeletal: Degenerative changes in  the spine. No destructive bone lesions. Review of the MIP images confirms the above findings. IMPRESSION: No evidence of significant pulmonary embolus. No evidence of active pulmonary disease. Enlarged left thyroid. Suggest ultrasound for further evaluation on follow-up. Electronically Signed   By: Lucienne Capers M.D.   On: 04/30/2016 23:34    Procedures Procedures (including critical care time)  Medications Ordered in ED Medications  iopamidol (ISOVUE-370) 76 % injection 100 mL (100 mLs Intravenous Contrast Given 04/30/16 2255)     Initial Impression / Assessment and Plan / ED Course  I have reviewed the triage vital signs and the nursing notes.  Pertinent labs & imaging results that were available during my care of the patient were reviewed by me and considered in my medical decision making (see chart for details).     CT angiogram of chest shows no pulmonary embolism. I discussed the enlarged left thyroid with the patient. She will get primary care follow-up.  Final Clinical Impressions(s) / ED Diagnoses   Final diagnoses:  Weakness    New Prescriptions New Prescriptions   No medications on file    I personally performed the services described in this documentation, which was scribed in my presence. The recorded information has been reviewed and is accurate.      Nat Christen, MD 04/30/16 [phone]

## 2016-04-30 NOTE — Discharge Instructions (Signed)
CT scan of the lungs showed no obvious blood clot.  The test did reveal an enlarged left thyroid. Recommend ultrasound of this area. This can be organized by your primary care doctor.

## 2016-11-12 ENCOUNTER — Ambulatory Visit (INDEPENDENT_AMBULATORY_CARE_PROVIDER_SITE_OTHER): Payer: Medicare Other | Admitting: Pediatrics

## 2016-11-12 ENCOUNTER — Encounter: Payer: Self-pay | Admitting: Pediatrics

## 2016-11-12 VITALS — BP 126/88 | HR 70 | Temp 96.7°F | Ht 65.5 in | Wt 182.0 lb

## 2016-11-12 DIAGNOSIS — G43809 Other migraine, not intractable, without status migrainosus: Secondary | ICD-10-CM

## 2016-11-12 DIAGNOSIS — F419 Anxiety disorder, unspecified: Secondary | ICD-10-CM | POA: Diagnosis not present

## 2016-11-12 DIAGNOSIS — Z13220 Encounter for screening for lipoid disorders: Secondary | ICD-10-CM | POA: Diagnosis not present

## 2016-11-12 DIAGNOSIS — E039 Hypothyroidism, unspecified: Secondary | ICD-10-CM

## 2016-11-12 DIAGNOSIS — E049 Nontoxic goiter, unspecified: Secondary | ICD-10-CM

## 2016-11-12 DIAGNOSIS — E871 Hypo-osmolality and hyponatremia: Secondary | ICD-10-CM

## 2016-11-12 MED ORDER — SUMATRIPTAN SUCCINATE 50 MG PO TABS: 50.0000 mg | ORAL_TABLET | ORAL | 3 refills | Status: DC | PRN

## 2016-11-12 MED ORDER — ESCITALOPRAM OXALATE 10 MG PO TABS: 10.0000 mg | ORAL_TABLET | Freq: Every day | ORAL | 5 refills | Status: DC

## 2016-11-12 NOTE — Patient Instructions (Signed)
[email or web address]

## 2016-11-12 NOTE — Progress Notes (Signed)
Subjective:   Patient ID: Amber Rivas, female    DOB: 1945-05-07, 71 y.o.   MRN: [id number] CC: New Patient (Initial Visit) f/u multiple med problems HPI: Amber Rivas is a 71 y.o. female presenting for New Patient (Initial Visit)  PCP passed away, here to establish care  Has had a healing R foot from collapse in ankle from arthitis, had 5 screws in place Now feels like she can walk without thinking about it Surgery was in January Was having to take advil, tylenol regularly, now about one of either in a day Has been limiting activity past few months, she thinks weight is up  Hypothyroid: takes levothyroxine regularly Noted to have enlarged L side of thyroid in ED incidental finding on CT scan Had u/s at [facility], was told she needed to follow up with endocrinology but she has not yet been  HA three days this week Increases when she has more stress Starts on R side of head, lasts for several hours hasnt changed Was given sumatriptan in ED, was very helpful but ran out  Has had a lot of stress recently with DIL recent carwreck Son with his on ongoing stress, she hasnt spoken to her son for 5 weeks due to stress in relationship Takes ativan 0.5mg  sometimes for headache Tries to avoid it Says she has a lot at home because not taking very often Was on zoloft years ago for depression, doesn't think it helped Had been seeing Almyra Free in [place] for counseling regularly several years ago, was very helpful at the time No thoughts of self harm  Past Medical History:  Diagnosis Date  . Headache   . Hyperthyroidism    Family History  Problem Relation Age of Onset  . Colon cancer Brother   . Cancer Brother   . Lung disease Sister   . Cancer Brother    Social History   Social History  . Marital status: Married    Spouse name: N/A  . Number of children: N/A  . Years of education: N/A   Social History Main Topics  . Smoking status: Never Smoker  . Smokeless tobacco:  Never Used  . Alcohol use No  . Drug use: No  . Sexual activity: Not on file   Other Topics Concern  . Not on file   Social History Narrative  . No narrative on file   ROS: All systems negative other than what is in HPI  Objective:    BP 126/88   Pulse 70   Temp (!) 96.7 F (35.9 C) (Oral)   Ht 5' 5.5" (1.664 m)   Wt 182 lb (82.6 kg)   BMI 29.83 kg/m   Wt Readings from Last 3 Encounters:  11/12/16 182 lb (82.6 kg)  04/30/16 177 lb (80.3 kg)  08/11/15 185 lb (83.9 kg)    Gen: NAD, alert, cooperative with exam, NCAT EYES: EOMI, no conjunctival injection, or no icterus ENT:  TMs pearly gray b/l, OP without erythema LYMPH: no cervical LAD Neck: no palpable nodules on thyroid CV: NRRR, normal S1/S2, no murmur, distal pulses 2+ b/l Resp: CTABL, no wheezes, normal WOB Abd: +BS, soft, NTND. no guarding or organomegaly Ext: No edema, warm Neuro: Alert and oriented, strength equal b/l UE and LE, coordination grossly normal MSK: normal muscle bulk  Assessment & Plan:  Amber Rivas was seen today for new patient (initial visit).  Diagnoses and all orders for this visit:  Anxiety Start lexapro, minimize ativan use Says she has  a lot at home, doesn't take regularly and doesn't need a refill Not taking daily -     Basic Metabolic Panel -     escitalopram (LEXAPRO) 10 MG tablet; Take 1 tablet (10 mg total) by mouth daily.  Hypothyroidism, unspecified type Repeat TSH, on 65mcg levothyroxine -     TSH  Hyponatremia On last labs from ED, repeat -     Basic Metabolic Panel  Screening for hyperlipidemia -     Lipid panel  Other migraine without status migrainosus, not intractable Use below as needed for HA, if continues to have mulitple HA a week let me know -     SUMAtriptan (IMITREX) 50 MG tablet; Take 1 tablet (50 mg total) by mouth every 2 (two) hours as needed for migraine (max 100 mg per 24 hours). May repeat in 2 hours if headache persists or recurs.  Thyroid  enlargement Request records from [facility] re thyroid u/s -     Ambulatory referral to Endocrinology   Follow up plan: Return in about 2 months (around 01/12/2017). Assunta Found, MD [place]

## 2016-11-13 LAB — LIPID PANEL
CHOLESTEROL TOTAL: 209 mg/dL — AB (ref 100–199)
Chol/HDL Ratio: 3.2 ratio (ref 0.0–4.4)
HDL: 66 mg/dL (ref >39)
LDL CALC: 110 mg/dL — AB (ref 0–99)
TRIGLYCERIDES: 166 mg/dL — AB (ref 0–149)
VLDL CHOLESTEROL CAL: 33 mg/dL (ref 5–40)

## 2016-11-13 LAB — BASIC METABOLIC PANEL
BUN/Creatinine Ratio: 18 (ref 12–28)
BUN: 11 mg/dL (ref 8–27)
CALCIUM: 9.7 mg/dL (ref 8.7–10.3)
CO2: 25 mmol/L (ref 20–29)
CREATININE: 0.62 mg/dL (ref 0.57–1.00)
Chloride: 98 mmol/L (ref 96–106)
GFR calc Af Amer: 106 mL/min/{1.73_m2} (ref >59)
GFR, EST NON AFRICAN AMERICAN: 92 mL/min/{1.73_m2} (ref >59)
GLUCOSE: 90 mg/dL (ref 65–99)
Potassium: 4.6 mmol/L (ref 3.5–5.2)
SODIUM: 139 mmol/L (ref 134–144)

## 2016-11-13 LAB — TSH: TSH: 2.78 u[IU]/mL (ref 0.450–4.500)

## 2016-12-24 ENCOUNTER — Ambulatory Visit (INDEPENDENT_AMBULATORY_CARE_PROVIDER_SITE_OTHER): Payer: Medicare Other | Admitting: Endocrinology

## 2016-12-24 ENCOUNTER — Other Ambulatory Visit (HOSPITAL_COMMUNITY)
Admission: RE | Admit: 2016-12-24 | Discharge: 2016-12-24 | Disposition: A | Discharge status: Home / Self Care | Payer: Medicare Other | Source: Ambulatory Visit | Attending: Endocrinology | Admitting: Endocrinology

## 2016-12-24 ENCOUNTER — Encounter: Payer: Self-pay | Admitting: Endocrinology

## 2016-12-24 DIAGNOSIS — E042 Nontoxic multinodular goiter: Secondary | ICD-10-CM | POA: Diagnosis not present

## 2016-12-24 NOTE — Progress Notes (Signed)
Subjective:    Patient ID: Amber Rivas, female    DOB: 24-Feb-1946, 71 y.o.   MRN: [id number]  HPI Pt is referred by Dr Evette Doffing, for nodular thyroid.  Pt was dx'ed with hypothyroidism in 1977.  In 2018, incidentally on a CT, she noted to have a nodule at the left thyroid lobe. she has no h/o XRT or surgery to the neck.   Main symptom is intermitt moderate headache, worst at the right frontal area, but no assoc neck swelling.  Past Medical History:  Diagnosis Date  . Headache   . Hyperthyroidism     Past Surgical History:  Procedure Laterality Date  . CHOLECYSTECTOMY  2005  . COLONOSCOPY N/A 04/28/2015   Procedure: COLONOSCOPY;  Surgeon: Rogene Houston, MD;  Location: [facility];  Service: Endoscopy;  Laterality: N/A;  730  . right ankle surgery      Social History   Social History  . Marital status: Married    Spouse name: N/A  . Number of children: N/A  . Years of education: N/A   Occupational History  . Not on file.   Social History Main Topics  . Smoking status: Never Smoker  . Smokeless tobacco: Never Used  . Alcohol use No  . Drug use: No  . Sexual activity: Not on file   Other Topics Concern  . Not on file   Social History Narrative  . No narrative on file    Current Outpatient Prescriptions on File Prior to Visit  Medication Sig Dispense Refill  . acetaminophen (TYLENOL) 500 MG tablet Take 500 mg by mouth every 6 (six) hours as needed for mild pain or moderate pain.    [name] aspirin EC 81 MG tablet Take 162 mg by mouth once as needed for mild pain or moderate pain.    [name] aspirin-acetaminophen-caffeine (EXCEDRIN MIGRAINE) 250-250-65 MG tablet Take by mouth every 6 (six) hours as needed for headache.    . B-COMPLEX-C PO Take 1 tablet by mouth daily.    [name] levothyroxine (SYNTHROID, LEVOTHROID) 50 MCG tablet Take 50 mcg by mouth daily.    [name] LORazepam (ATIVAN) 0.5 MG tablet Take 0.5 mg by mouth daily as needed (migrane).     . Multiple Vitamin (MULTIVITAMIN WITH  MINERALS) TABS tablet Take 1 tablet by mouth daily.    . Omega-3 Fatty Acids (OMEGA 3 PO) Take 1 capsule by mouth 2 (two) times daily.    . SUMAtriptan (IMITREX) 50 MG tablet Take 1 tablet (50 mg total) by mouth every 2 (two) hours as needed for migraine (max 100 mg per 24 hours). May repeat in 2 hours if headache persists or recurs. 14 tablet 3  . TURMERIC PO Take 1 tablet by mouth daily.    [name] escitalopram (LEXAPRO) 10 MG tablet Take 1 tablet (10 mg total) by mouth daily. (Patient not taking: Reported on 12/24/2016) 30 tablet 5  . naproxen sodium (ALEVE) 220 MG tablet Take 220 mg by mouth daily as needed (for pain).     No current facility-administered medications on file prior to visit.     Allergies  Allergen Reactions  . Sulfur Anaphylaxis    Family History  Problem Relation Age of Onset  . Colon cancer Brother   . Cancer Brother   . Lung disease Sister   . Hypothyroidism Sister   . Cancer Brother     BP (!) 142/98   Pulse 67   Wt 184 lb 3.2 oz (83.6 kg)  SpO2 98%   BMI 30.19 kg/m    Review of Systems Denies hoarseness, neck pain, visual loss, chest pain, sob, cough, dysphagia, diarrhea, itching, flushing, easy bruising, depression, cold intolerance, numbness, and rhinorrhea.  She has weight gain.      Objective:   Physical Exam VS: see vs page GEN: no distress HEAD: head: no deformity eyes: no periorbital swelling, no proptosis external nose and ears are normal mouth: no lesion seen NECK: approx 3x2 cm left thyroid nodule.  approx 1 cm nodule is noted on the right.  CHEST WALL: no deformity LUNGS: clear to auscultation CV: reg rate and rhythm, no murmur ABD: abdomen is soft, nontender.  no hepatosplenomegaly.  not distended.  no hernia MUSCULOSKELETAL: muscle bulk and strength are grossly normal.  no obvious joint swelling.  gait is normal and steady EXTEMITIES: no deformity.  no edema PULSES: no carotid bruit NEURO:  cn 2-12 grossly intact.   readily moves  all 4's.  sensation is intact to touch on all 4's SKIN:  Normal texture and temperature.  No rash or suspicious lesion is visible.   NODES:  None palpable at the neck PSYCH: alert, well-oriented.  Does not appear anxious nor depressed.  CT: diffuse enlargement of the left lobe of the thyroid, measuring up to 2.6 x 3.7 cm.    Lab Results  Component Value Date   TSH 2.780 11/12/2016   I have requested records from [facility] center  thyroid needle bx: consent obtained, signed form on chart The area is first sprayed with cooling agent local: xylocaine 2%, with epinephrine prep: alcohol pad 4 bxs are done with 25 and 21J needles no complications.      Assessment & Plan:  Hypothyroidism: well-controlled.  Thyroid nodule, new, uncertain etiology.   Patient Instructions  We'll let you know about the biopsy results. If as expected, no cancer is found, Please come back for a follow-up appointment in 6 months.

## 2016-12-24 NOTE — Patient Instructions (Signed)
We'll let you know about the biopsy results.  If as expected, no cancer is found, Please come back for a follow-up appointment in 6 months.   

## 2017-01-14 ENCOUNTER — Encounter: Payer: Self-pay | Admitting: Pediatrics

## 2017-01-14 ENCOUNTER — Ambulatory Visit (INDEPENDENT_AMBULATORY_CARE_PROVIDER_SITE_OTHER): Payer: Medicare Other | Admitting: Pediatrics

## 2017-01-14 VITALS — BP 134/89 | HR 69 | Temp 97.2°F | Ht 65.5 in | Wt 185.4 lb

## 2017-01-14 DIAGNOSIS — Z23 Encounter for immunization: Secondary | ICD-10-CM | POA: Diagnosis not present

## 2017-01-14 DIAGNOSIS — F419 Anxiety disorder, unspecified: Secondary | ICD-10-CM

## 2017-01-14 DIAGNOSIS — E039 Hypothyroidism, unspecified: Secondary | ICD-10-CM

## 2017-01-14 DIAGNOSIS — E049 Nontoxic goiter, unspecified: Secondary | ICD-10-CM | POA: Diagnosis not present

## 2017-01-14 DIAGNOSIS — G43809 Other migraine, not intractable, without status migrainosus: Secondary | ICD-10-CM

## 2017-01-14 MED ORDER — LORAZEPAM 0.5 MG PO TABS: 0.5000 mg | ORAL_TABLET | Freq: Every day | ORAL | 1 refills | Status: DC | PRN

## 2017-01-14 MED ORDER — LEVOTHYROXINE SODIUM 50 MCG PO TABS: 50.0000 ug | ORAL_TABLET | Freq: Every day | ORAL | 3 refills | Status: DC

## 2017-01-14 NOTE — Progress Notes (Signed)
  Subjective:   Patient ID: Amber Rivas, female    DOB: 1945-10-25, 71 y.o.   MRN: [id number] CC: Follow-up med problems  HPI: TORIN WHISNER is a 71 y.o. female presenting for Follow-up  Thyroid enlargement: s/p FNA, bethesda III  Was referred to surg for L thyroid lobectomy, has upcoming appt  Anxiety: son with bipolar Husband with untreated depression per pt Pt didn't start lexapro Thinks mood and anxiety have been doing better Trying to not worry about family as much, feels better that Rarely uses ativan, less than once a week  Headaches: taking excedrin migraine or sumatriptan for headaches, helping  Relevant past medical, surgical, family and social history reviewed. Allergies and medications reviewed and updated. History  Smoking Status  . Never Smoker  Smokeless Tobacco  . Never Used   ROS: Per HPI   Objective:    BP 134/89   Pulse 69   Temp (!) 97.2 F (36.2 C) (Oral)   Ht 5' 5.5" (1.664 m)   Wt 185 lb 6.4 oz (84.1 kg)   BMI 30.38 kg/m   Wt Readings from Last 3 Encounters:  01/14/17 185 lb 6.4 oz (84.1 kg)  12/24/16 184 lb 3.2 oz (83.6 kg)  11/12/16 182 lb (82.6 kg)    Gen: NAD, alert, cooperative with exam, NCAT EYES: EOMI, no conjunctival injection, or no icterus ENT: OP without erythema CV: NRRR, normal S1/S2, no murmur, distal pulses 2+ b/l Resp: CTABL, no wheezes, normal WOB Abd: +BS, soft, NTND. Ext: No edema, warm Neuro: Alert and oriented, strength equal b/l UE and LE, coordination grossly normal MSK: normal muscle bulk  Assessment & Plan:  Donnis was seen today for follow-up med problems  Diagnoses and all orders for this visit:  Anxiety Improved Not on SSRI now Rarely needing ativan, less than once a week -     LORazepam (ATIVAN) 0.5 MG tablet; Take 1 tablet (0.5 mg total) by mouth daily as needed (migrane).  Hypothyroidism, unspecified type Stable, cont below -     levothyroxine (SYNTHROID, LEVOTHROID) 50 MCG tablet; Take 1 tablet  (50 mcg total) by mouth daily.  Other migraine without status migrainosus, not intractable Improved, HA go away usually with execedrin, rarely needs sumatriptan  Thyroid enlargement rec by endocrine to have L thyroid lobectomy Discussed with pt, answered pt questions re biopsy result Already has referral in to surgery Will have pt sign to get u/s records form [facility] center sent over  Need for immunization against influenza -     Flu Vaccine QUAD 36+ mos IM   Follow up plan: Return in about 6 months (around 07/15/2017). Assunta Found, MD [place]

## 2017-01-16 ENCOUNTER — Ambulatory Visit: Payer: Self-pay | Admitting: Surgery

## 2017-01-16 NOTE — H&P (Signed)
[place] 01/16/2017 3:18 PM Location: [facility] Patient #: [id number] DOB: 03/09/1946 Married / Language: English / Race: White Female  History of Present Illness Adin Hector MD; 01/16/2017 4:08 PM) The patient is a 71 year old female who presents with a thyroid nodule. Note for "Thyroid nodule": ` ` ` Patient sent for surgical consultation at the request of Dr. Loanne Drilling  Chief Complaint: Left thyroid nodule. Biopsy cannot r/o follicular neoplasm  The patient is a pleasant female who was found to have a left thyroid enlargement incidentally on a CT of the chest. She had an ultrasound done at an outside institution. Found to have a 2.4 cm left thyroid nodule. Needle biopsy done showed Bethesda category 3, cannot rule out follicular neoplasm. Surgical consultation recommended. In reviewing the ultrasound, she also had a 2 cm nodule on the right side as well. Ultrasound follow-up suggested at that time. As far as I know, that area has not been biopsied. She comes today with a close friend. Denies difficulty breathing or swallowing. No fullness. She's had hypothyroidism for decades. She's been on think Synthroid for a long time. Has not required any adjustment to it. No prior surgeries. Can walk a half hour without difficulty. Has been more challenged with right foot and ankle surgery. Followed by Dr. Doran Durand. There discussion about perhaps removing screws.  (Review of systems as stated in this history (HPI) or in the review of systems. Otherwise all other 12 point ROS are negative)   Past Surgical History (Tanisha A. Owens Shark, [employer]; 01/16/2017 3:18 PM) Breast Biopsy Right. Foot Surgery Right. Gallbladder Surgery - Laparoscopic  Diagnostic Studies History (Tanisha A. Owens Shark, [place]; 01/16/2017 3:18 PM) Mammogram 1-3 years ago Pap Smear >5 years ago  Allergies (Tanisha A. Owens Shark, [place]; 01/16/2017 3:19 PM) No Known Drug Allergies 01/16/2017 Allergies  Reconciled  Medication History (Tanisha A. Owens Shark, RMA; 01/16/2017 3:20 PM) SUMAtriptan Succinate (50MG  Tablet, Oral) Active. Levothyroxine Sodium (50MCG Tablet, Oral) Active. Escitalopram Oxalate (10MG  Tablet, Oral) Active. Aspirin (81MG  Tablet, Oral) Active. Multiple Vitamin (Oral) Active. Medications Reconciled  Social History (Tanisha A. Owens Shark, [place]; 01/16/2017 3:18 PM) Alcohol use Occasional alcohol use. Caffeine use Tea. No drug use Tobacco use Never smoker.  Family History (Tanisha A. Owens Shark, [place]; 01/16/2017 3:18 PM) Arthritis Mother. Colon Cancer Brother. Depression Son. Hypertension Brother, Mother, Sister. Kidney Disease Sister. Prostate Cancer Brother. Rectal Cancer Brother. Respiratory Condition Sister. Thyroid problems Sister.  Pregnancy / Birth History (Tanisha A. Owens Shark, [place]; 01/16/2017 3:18 PM) Age at menarche 68 years. Age of menopause 51-55 Contraceptive History Oral contraceptives. Gravida 1 Length (months) of breastfeeding 3-6 Maternal age 57-30 Para 1  Other Problems (Tanisha A. Owens Shark, [place]; 01/16/2017 3:18 PM) Thyroid Disease     Review of Systems (Tanisha A. Brown RMA; 01/16/2017 3:18 PM) General Present- Weight Gain. Not Present- Appetite Loss, Chills, Fatigue, Fever, Night Sweats and Weight Loss. Skin Not Present- Change in Wart/Mole, Dryness, Hives, Jaundice, New Lesions, Non-Healing Wounds, Rash and Ulcer. HEENT Present- Wears glasses/contact lenses. Not Present- Earache, Hearing Loss, Hoarseness, Nose Bleed, Oral Ulcers, Ringing in the Ears, Seasonal Allergies, Sinus Pain, Sore Throat, Visual Disturbances and Yellow Eyes. Respiratory Not Present- Bloody sputum, Chronic Cough, Difficulty Breathing, Snoring and Wheezing. Breast Not Present- Breast Mass, Breast Pain, Nipple Discharge and Skin Changes. Cardiovascular Not Present- Chest Pain, Difficulty Breathing Lying Down, Leg Cramps, Palpitations, Rapid Heart Rate,  Shortness of Breath and Swelling of Extremities. Gastrointestinal Not Present- Abdominal Pain, Bloating, Bloody Stool, Change in Bowel Habits,  Chronic diarrhea, Constipation, Difficulty Swallowing, Excessive gas, Gets full quickly at meals, Hemorrhoids, Indigestion, Nausea, Rectal Pain and Vomiting. Female Genitourinary Not Present- Frequency, Nocturia, Painful Urination, Pelvic Pain and Urgency. Neurological Present- Headaches. Not Present- Decreased Memory, Fainting, Numbness, Seizures, Tingling, Tremor, Trouble walking and Weakness. Psychiatric Not Present- Anxiety, Bipolar, Change in Sleep Pattern, Depression, Fearful and Frequent crying. Hematology Present- Gland problems. Not Present- Blood Thinners, Easy Bruising, Excessive bleeding, HIV and Persistent Infections.  Vitals (Tanisha A. Brown RMA; 01/16/2017 3:19 PM) 01/16/2017 3:18 PM Weight: 185.2 lb Height: 64.5in Body Surface Area: 1.9 m Body Mass Index: 31.3 kg/m  Temp.: 97.53F  Pulse: 74 (Regular)  BP: 142/88 (Sitting, Left Arm, Standard)      Physical Exam Adin Hector MD; 01/16/2017 3:49 PM)  General Mental Status-Alert. General Appearance-Not in acute distress, Not Sickly. Orientation-Oriented X3. Hydration-Well hydrated. Voice-Normal.  Integumentary Global Assessment Upon inspection and palpation of skin surfaces of the - Axillae: non-tender, no inflammation or ulceration, no drainage. and Distribution of scalp and body hair is normal. General Characteristics Temperature - normal warmth is noted.  Head and Neck Head-normocephalic, atraumatic with no lesions or palpable masses. Face Global Assessment - atraumatic, no absence of expression. Neck Global Assessment - no abnormal movements, no bruit auscultated on the right, no bruit auscultated on the left, no decreased range of motion, non-tender. Trachea-midline. Thyroid Gland Characteristics - non-tender.  Eye Eyeball -  Left-Extraocular movements intact, No Nystagmus. Eyeball - Right-Extraocular movements intact, No Nystagmus. Cornea - Left-No Hazy. Cornea - Right-No Hazy. Sclera/Conjunctiva - Left-No scleral icterus, No Discharge. Sclera/Conjunctiva - Right-No scleral icterus, No Discharge. Pupil - Left-Direct reaction to light normal. Pupil - Right-Direct reaction to light normal. Note: Wears glasses. Vision corrected  ENMT Ears Pinna - Left - no drainage observed, no generalized tenderness observed. Right - no drainage observed, no generalized tenderness observed. Nose and Sinuses External Inspection of the Nose - no destructive lesion observed. Inspection of the nares - Left - quiet respiration. Right - quiet respiration. Mouth and Throat Lips - Upper Lip - no fissures observed, no pallor noted. Lower Lip - no fissures observed, no pallor noted. Nasopharynx - no discharge present. Oral Cavity/Oropharynx - Tongue - no dryness observed. Oral Mucosa - no cyanosis observed. Hypopharynx - no evidence of airway distress observed. Note: Mild fullness in left upper outer thyroid lobe. Less so on right. No conversational dyspnea. No hoarseness. No other neck masses  Chest and Lung Exam Inspection Movements - Normal and Symmetrical. Accessory muscles - No use of accessory muscles in breathing. Palpation Palpation of the chest reveals - Non-tender. Auscultation Breath sounds - Normal and Clear.  Cardiovascular Auscultation Rhythm - Regular. Murmurs & Other Heart Sounds - Auscultation of the heart reveals - No Murmurs and No Systolic Clicks.  Abdomen Inspection Inspection of the abdomen reveals - No Visible peristalsis and No Abnormal pulsations. Umbilicus - No Bleeding, No Urine drainage. Palpation/Percussion Palpation and Percussion of the abdomen reveal - Soft, Non Tender, No Rebound tenderness, No Rigidity (guarding) and No Cutaneous hyperesthesia. Note: Abdomen soft. Not severely  distended. No distasis recti. No umbilical or other anterior abdominal wall hernias  Female Genitourinary Sexual Maturity Tanner 5 - Adult hair pattern. Note: No vaginal bleeding nor discharge  Peripheral Vascular Upper Extremity Inspection - Left - No Cyanotic nailbeds, Not Ischemic. Right - No Cyanotic nailbeds, Not Ischemic.  Neurologic Neurologic evaluation reveals -normal attention span and ability to concentrate, able to name objects and repeat phrases. Appropriate fund  of knowledge , normal sensation and normal coordination. Mental Status Affect - not angry, not paranoid. Cranial Nerves-Normal Bilaterally. Gait-Normal.  Neuropsychiatric Mental status exam performed with findings of-able to articulate well with normal speech/language, rate, volume and coherence, thought content normal with ability to perform basic computations and apply abstract reasoning and no evidence of hallucinations, delusions, obsessions or homicidal/suicidal ideation.  Musculoskeletal Global Assessment Spine, Ribs and Pelvis - no instability, subluxation or laxity. Right Upper Extremity - no instability, subluxation or laxity.  Lymphatic Head & Neck  General Head & Neck Lymphatics: Bilateral - Description - No Localized lymphadenopathy. Axillary  General Axillary Region: Bilateral - Description - No Localized lymphadenopathy. Femoral & Inguinal  Generalized Femoral & Inguinal Lymphatics: Left - Description - No Localized lymphadenopathy. Right - Description - No Localized lymphadenopathy.    Assessment & Plan Adin Hector MD; 01/16/2017 4:04 PM)  LEFT THYROID NODULE (E04.1) Impression: 2.4 cm left thyroid nodule. Biopsy concerning for Bethesda criteria 3. Cannot rule out follicular neoplasm. At the very least, she warrants hemithyroidectomy on the left side. If there is cancer, come back for completion thyroidectomy.  She also has a nodule on the opposite side. It is only 2 cm.  I do not think that has been biopsied. Called and discussed with her endocrinologist Dr. Loanne Drilling. He was unable to get the report at the time of this consultation. He do not feel strongly needed necessarily to be removed as long as it was followed and did not seem suspicious intraoperatively. I think I'll make an intraoperative decision. If suspicious, proceed with completion thyroidectomy.  After discussing the patient, she rather proceed with total thyroidectomy to hedge against any risk of future issues. She is aleady on Synthroid & has tolerated it for some time.  Current Plans The anatomy and physiology of the thyroid gland and organs of the neck were discussed. Pathophysiology of thyroid problems were discussed. Options were discussed, and I made a recommendation to remove part (and possibly all) of the thyroid gland to treat the pathology.  Risks of bleeding, infection, injury to other organs including nerves, reoperation, death, and other risks were discussed. I noted a good likelihood this will help address the problem. While there are risks, I feel the risks of nonoperative management are greater; therefore, I feel surgery offers the best option. Educational material was given to help further explain the topics & concerns from our discussion. We will work to minimize complications.  You are being scheduled for surgery- Our schedulers will call you.  You should hear from our office's scheduling department within 5 working days about the location, date, and time of surgery. We try to make accommodations for patient's preferences in scheduling surgery, but sometimes the OR schedule or the surgeon's schedule prevents [place] from making those accommodations.  If you have not heard from our office [phone]) in 5 working days, call the office and ask for your surgeon's nurse.  If you have other questions about your diagnosis, plan, or surgery, call the office and ask for your surgeon's  nurse.  Pt Education - Pamphlet Given - The Thyroid Book: discussed with patient and provided information. RIGHT THYROID NODULE (E04.1) Impression: Smaller 2 cm. She is leaning towards completion thyroidectomy to remove all areas  HYPOTHYROIDISM, UNSPECIFIED TYPE (E03.9) Impression: On chronic Synthroid for decades. Most likely will need to be increased after surgery. Defer to her endocrinologist, Dr. Matt Holmes, M.D., F.A.C.S. Gastrointestinal and Minimally Invasive [facility]  [address], [place], [place] [address] [phone] Main / Paging

## 2017-01-16 NOTE — H&P (View-Only) (Signed)
[place] 01/16/2017 3:18 PM Location: [facility] Patient #: [id number] DOB: 09/13/1945 Married / Language: English / Race: White Female  History of Present Illness Adin Hector MD; 01/16/2017 4:08 PM) The patient is a 71 year old female who presents with a thyroid nodule. Note for "Thyroid nodule": ` ` ` Patient sent for surgical consultation at the request of Dr. Loanne Drilling  Chief Complaint: Left thyroid nodule. Biopsy cannot r/o follicular neoplasm  The patient is a pleasant female who was found to have a left thyroid enlargement incidentally on a CT of the chest. She had an ultrasound done at an outside institution. Found to have a 2.4 cm left thyroid nodule. Needle biopsy done showed Bethesda category 3, cannot rule out follicular neoplasm. Surgical consultation recommended. In reviewing the ultrasound, she also had a 2 cm nodule on the right side as well. Ultrasound follow-up suggested at that time. As far as I know, that area has not been biopsied. She comes today with a close friend. Denies difficulty breathing or swallowing. No fullness. She's had hypothyroidism for decades. She's been on think Synthroid for a long time. Has not required any adjustment to it. No prior surgeries. Can walk a half hour without difficulty. Has been more challenged with right foot and ankle surgery. Followed by Dr. Doran Durand. There discussion about perhaps removing screws.  (Review of systems as stated in this history (HPI) or in the review of systems. Otherwise all other 12 point ROS are negative)   Past Surgical History (Tanisha A. Owens Shark, [place]; 01/16/2017 3:18 PM) Breast Biopsy Right. Foot Surgery Right. Gallbladder Surgery - Laparoscopic  Diagnostic Studies History (Tanisha A. Owens Shark, [place]; 01/16/2017 3:18 PM) Mammogram 1-3 years ago Pap Smear >5 years ago  Allergies (Tanisha A. Owens Shark, [place]; 01/16/2017 3:19 PM) No Known Drug Allergies 01/16/2017 Allergies  Reconciled  Medication History (Tanisha A. Owens Shark, [place]; 01/16/2017 3:20 PM) SUMAtriptan Succinate (50MG  Tablet, Oral) Active. Levothyroxine Sodium (50MCG Tablet, Oral) Active. Escitalopram Oxalate (10MG  Tablet, Oral) Active. Aspirin (81MG  Tablet, Oral) Active. Multiple Vitamin (Oral) Active. Medications Reconciled  Social History (Tanisha A. Owens Shark, [place]; 01/16/2017 3:18 PM) Alcohol use Occasional alcohol use. Caffeine use Tea. No drug use Tobacco use Never smoker.  Family History (Tanisha A. Owens Shark, [place]; 01/16/2017 3:18 PM) Arthritis Mother. Colon Cancer Brother. Depression Son. Hypertension Brother, Mother, Sister. Kidney Disease Sister. Prostate Cancer Brother. Rectal Cancer Brother. Respiratory Condition Sister. Thyroid problems Sister.  Pregnancy / Birth History (Tanisha A. Owens Shark, [place]; 01/16/2017 3:18 PM) Age at menarche 66 years. Age of menopause 51-55 Contraceptive History Oral contraceptives. Gravida 1 Length (months) of breastfeeding 3-6 Maternal age 23-30 Para 1  Other Problems (Tanisha A. Owens Shark, [place]; 01/16/2017 3:18 PM) Thyroid Disease     Review of Systems (Tanisha A. Brown RMA; 01/16/2017 3:18 PM) General Present- Weight Gain. Not Present- Appetite Loss, Chills, Fatigue, Fever, Night Sweats and Weight Loss. Skin Not Present- Change in Wart/Mole, Dryness, Hives, Jaundice, New Lesions, Non-Healing Wounds, Rash and Ulcer. HEENT Present- Wears glasses/contact lenses. Not Present- Earache, Hearing Loss, Hoarseness, Nose Bleed, Oral Ulcers, Ringing in the Ears, Seasonal Allergies, Sinus Pain, Sore Throat, Visual Disturbances and Yellow Eyes. Respiratory Not Present- Bloody sputum, Chronic Cough, Difficulty Breathing, Snoring and Wheezing. Breast Not Present- Breast Mass, Breast Pain, Nipple Discharge and Skin Changes. Cardiovascular Not Present- Chest Pain, Difficulty Breathing Lying Down, Leg Cramps, Palpitations, Rapid Heart Rate,  Shortness of Breath and Swelling of Extremities. Gastrointestinal Not Present- Abdominal Pain, Bloating, Bloody Stool, Change in Bowel Habits,  Chronic diarrhea, Constipation, Difficulty Swallowing, Excessive gas, Gets full quickly at meals, Hemorrhoids, Indigestion, Nausea, Rectal Pain and Vomiting. Female Genitourinary Not Present- Frequency, Nocturia, Painful Urination, Pelvic Pain and Urgency. Neurological Present- Headaches. Not Present- Decreased Memory, Fainting, Numbness, Seizures, Tingling, Tremor, Trouble walking and Weakness. Psychiatric Not Present- Anxiety, Bipolar, Change in Sleep Pattern, Depression, Fearful and Frequent crying. Hematology Present- Gland problems. Not Present- Blood Thinners, Easy Bruising, Excessive bleeding, HIV and Persistent Infections.  Vitals (Tanisha A. Brown RMA; 01/16/2017 3:19 PM) 01/16/2017 3:18 PM Weight: 185.2 lb Height: 64.5in Body Surface Area: 1.9 m Body Mass Index: 31.3 kg/m  Temp.: 97.5F  Pulse: 74 (Regular)  BP: 142/88 (Sitting, Left Arm, Standard)      Physical Exam Adin Hector MD; 01/16/2017 3:49 PM)  General Mental Status-Alert. General Appearance-Not in acute distress, Not Sickly. Orientation-Oriented X3. Hydration-Well hydrated. Voice-Normal.  Integumentary Global Assessment Upon inspection and palpation of skin surfaces of the - Axillae: non-tender, no inflammation or ulceration, no drainage. and Distribution of scalp and body hair is normal. General Characteristics Temperature - normal warmth is noted.  Head and Neck Head-normocephalic, atraumatic with no lesions or palpable masses. Face Global Assessment - atraumatic, no absence of expression. Neck Global Assessment - no abnormal movements, no bruit auscultated on the right, no bruit auscultated on the left, no decreased range of motion, non-tender. Trachea-midline. Thyroid Gland Characteristics - non-tender.  Eye Eyeball -  Left-Extraocular movements intact, No Nystagmus. Eyeball - Right-Extraocular movements intact, No Nystagmus. Cornea - Left-No Hazy. Cornea - Right-No Hazy. Sclera/Conjunctiva - Left-No scleral icterus, No Discharge. Sclera/Conjunctiva - Right-No scleral icterus, No Discharge. Pupil - Left-Direct reaction to light normal. Pupil - Right-Direct reaction to light normal. Note: Wears glasses. Vision corrected  ENMT Ears Pinna - Left - no drainage observed, no generalized tenderness observed. Right - no drainage observed, no generalized tenderness observed. Nose and Sinuses External Inspection of the Nose - no destructive lesion observed. Inspection of the nares - Left - quiet respiration. Right - quiet respiration. Mouth and Throat Lips - Upper Lip - no fissures observed, no pallor noted. Lower Lip - no fissures observed, no pallor noted. Nasopharynx - no discharge present. Oral Cavity/Oropharynx - Tongue - no dryness observed. Oral Mucosa - no cyanosis observed. Hypopharynx - no evidence of airway distress observed. Note: Mild fullness in left upper outer thyroid lobe. Less so on right. No conversational dyspnea. No hoarseness. No other neck masses  Chest and Lung Exam Inspection Movements - Normal and Symmetrical. Accessory muscles - No use of accessory muscles in breathing. Palpation Palpation of the chest reveals - Non-tender. Auscultation Breath sounds - Normal and Clear.  Cardiovascular Auscultation Rhythm - Regular. Murmurs & Other Heart Sounds - Auscultation of the heart reveals - No Murmurs and No Systolic Clicks.  Abdomen Inspection Inspection of the abdomen reveals - No Visible peristalsis and No Abnormal pulsations. Umbilicus - No Bleeding, No Urine drainage. Palpation/Percussion Palpation and Percussion of the abdomen reveal - Soft, Non Tender, No Rebound tenderness, No Rigidity (guarding) and No Cutaneous hyperesthesia. Note: Abdomen soft. Not severely  distended. No distasis recti. No umbilical or other anterior abdominal wall hernias  Female Genitourinary Sexual Maturity Tanner 5 - Adult hair pattern. Note: No vaginal bleeding nor discharge  Peripheral Vascular Upper Extremity Inspection - Left - No Cyanotic nailbeds, Not Ischemic. Right - No Cyanotic nailbeds, Not Ischemic.  Neurologic Neurologic evaluation reveals -normal attention span and ability to concentrate, able to name objects and repeat phrases. Appropriate fund  of knowledge , normal sensation and normal coordination. Mental Status Affect - not angry, not paranoid. Cranial Nerves-Normal Bilaterally. Gait-Normal.  Neuropsychiatric Mental status exam performed with findings of-able to articulate well with normal speech/language, rate, volume and coherence, thought content normal with ability to perform basic computations and apply abstract reasoning and no evidence of hallucinations, delusions, obsessions or homicidal/suicidal ideation.  Musculoskeletal Global Assessment Spine, Ribs and Pelvis - no instability, subluxation or laxity. Right Upper Extremity - no instability, subluxation or laxity.  Lymphatic Head & Neck  General Head & Neck Lymphatics: Bilateral - Description - No Localized lymphadenopathy. Axillary  General Axillary Region: Bilateral - Description - No Localized lymphadenopathy. Femoral & Inguinal  Generalized Femoral & Inguinal Lymphatics: Left - Description - No Localized lymphadenopathy. Right - Description - No Localized lymphadenopathy.    Assessment & Plan Adin Hector MD; 01/16/2017 4:04 PM)  LEFT THYROID NODULE (E04.1) Impression: 2.4 cm left thyroid nodule. Biopsy concerning for Bethesda criteria 3. Cannot rule out follicular neoplasm. At the very least, she warrants hemithyroidectomy on the left side. If there is cancer, come back for completion thyroidectomy.  She also has a nodule on the opposite side. It is only 2 cm.  I do not think that has been biopsied. Called and discussed with her endocrinologist Dr. Loanne Drilling. He was unable to get the report at the time of this consultation. He do not feel strongly needed necessarily to be removed as long as it was followed and did not seem suspicious intraoperatively. I think I'll make an intraoperative decision. If suspicious, proceed with completion thyroidectomy.  After discussing the patient, she rather proceed with total thyroidectomy to hedge against any risk of future issues. She is aleady on Synthroid & has tolerated it for some time.  Current Plans The anatomy and physiology of the thyroid gland and organs of the neck were discussed. Pathophysiology of thyroid problems were discussed. Options were discussed, and I made a recommendation to remove part (and possibly all) of the thyroid gland to treat the pathology.  Risks of bleeding, infection, injury to other organs including nerves, reoperation, death, and other risks were discussed. I noted a good likelihood this will help address the problem. While there are risks, I feel the risks of nonoperative management are greater; therefore, I feel surgery offers the best option. Educational material was given to help further explain the topics & concerns from our discussion. We will work to minimize complications.  You are being scheduled for surgery- Our schedulers will call you.  You should hear from our office's scheduling department within 5 working days about the location, date, and time of surgery. We try to make accommodations for patient's preferences in scheduling surgery, but sometimes the OR schedule or the surgeon's schedule prevents [place] from making those accommodations.  If you have not heard from our office [phone]) in 5 working days, call the office and ask for your surgeon's nurse.  If you have other questions about your diagnosis, plan, or surgery, call the office and ask for your surgeon's  nurse.  Pt Education - Pamphlet Given - The Thyroid Book: discussed with patient and provided information. RIGHT THYROID NODULE (E04.1) Impression: Smaller 2 cm. She is leaning towards completion thyroidectomy to remove all areas  HYPOTHYROIDISM, UNSPECIFIED TYPE (E03.9) Impression: On chronic Synthroid for decades. Most likely will need to be increased after surgery. Defer to her endocrinologist, Dr. Matt Holmes, M.D., F.A.C.S. Gastrointestinal and Minimally Invasive Surgery [facility]  [address], [place], [place] [address] [phone] Main / Paging

## 2017-01-21 ENCOUNTER — Other Ambulatory Visit: Payer: Self-pay | Admitting: Orthopedic Surgery

## 2017-01-21 DIAGNOSIS — M79671 Pain in right foot: Secondary | ICD-10-CM

## 2017-01-23 ENCOUNTER — Ambulatory Visit
Admission: RE | Admit: 2017-01-23 | Discharge: 2017-01-23 | Disposition: A | Discharge status: Home / Self Care | Payer: Medicare Other | Source: Ambulatory Visit | Attending: Orthopedic Surgery | Admitting: Orthopedic Surgery

## 2017-01-23 DIAGNOSIS — M79671 Pain in right foot: Secondary | ICD-10-CM

## 2017-01-30 NOTE — Patient Instructions (Signed)
[place]  01/30/2017   Your procedure is scheduled on: Thursday, Nov. 15, 2018   Report to [facility] Main  Entrance   Take [place]  elevators to 3rd floor to  [place] at 9:00 AM.    Call this number if you have problems the morning of surgery [phone]    Remember: ONLY 1 PERSON MAY GO WITH YOU TO SHORT STAY TO GET  READY MORNING OF [place].   Do not eat food or drink liquids :After Midnight.    Take these medicines the morning of surgery with A SIP OF WATER: Levothyroxine                               You may not have any metal on your body including hair pins, jewelry, and  piercings             Do not wear  make-up, lotions, powders or perfumes, deodorant             Do not wear nail polish.  Do not shave  48 hours prior to surgery.                Do not bring valuables to the hospital. [place].   Contacts, dentures or bridgework may not be worn into surgery.   Leave suitcase in the car. After surgery it may be brought to your room.              Please read over the following fact sheets you were given: _____________________________________________________________________             [facility] - Preparing for Surgery Before surgery, you can play an important role.  Because skin is not sterile, your skin needs to be as free of germs as possible.  You can reduce the number of germs on your skin by washing with CHG (chlorahexidine gluconate) soap before surgery.  CHG is an antiseptic cleaner which kills germs and bonds with the skin to continue killing germs even after washing. Please DO NOT use if you have an allergy to CHG or antibacterial soaps.  If your skin becomes reddened/irritated stop using the CHG and inform your nurse when you arrive at Short Stay. Do not shave (including legs and underarms) for at least 48 hours prior to the first CHG shower.  You may shave your  face/neck.  Please follow these instructions carefully:  1.  Shower with CHG Soap the night before surgery and the  morning of surgery.  2.  If you choose to wash your hair, wash your hair first as usual with your normal  shampoo.  3.  After you shampoo, rinse your hair and body thoroughly to remove the shampoo.                             4.  Use CHG as you would any other liquid soap.  You can apply chg directly to the skin and wash.  Gently with a scrungie or clean washcloth.  5.  Apply the CHG Soap to your body ONLY FROM THE NECK DOWN.   Do   not use on face/ open  Wound or open sores. Avoid contact with eyes, ears mouth and   genitals (private parts).                       Wash face,  Genitals (private parts) with your normal soap.             6.  Wash thoroughly, paying special attention to the area where your    surgery  will be performed.  7.  Thoroughly rinse your body with warm water from the neck down.  8.  DO NOT shower/wash with your normal soap after using and rinsing off the CHG Soap.                9.  Pat yourself dry with a clean towel.            10.  Wear clean pajamas.            11.  Place clean sheets on your bed the night of your first shower and do not  sleep with pets. Day of Surgery : Do not apply any lotions/deodorants the morning of surgery.  Please wear clean clothes to the hospital/surgery center.  FAILURE TO FOLLOW THESE INSTRUCTIONS MAY RESULT IN THE CANCELLATION OF YOUR SURGERY  PATIENT SIGNATURE_________________________________  NURSE SIGNATURE__________________________________  ________________________________________________________________________

## 2017-01-30 NOTE — Pre-Procedure Instructions (Signed)
The following are in epic: EKG 04/30/16 CT chest 04/30/16 CXR 2 view 04/30/16 CT right foot 01/23/17 Last office visit note Dr. Evette Doffing 01/14/17 Last office visit note Dr.  Loanne Drilling 12/24/16

## 2017-02-04 ENCOUNTER — Encounter (HOSPITAL_COMMUNITY)
Admission: RE | Admit: 2017-02-04 | Discharge: 2017-02-04 | Disposition: A | Discharge status: Home / Self Care | Payer: Medicare Other | Source: Ambulatory Visit | Attending: Surgery | Admitting: Surgery

## 2017-02-04 ENCOUNTER — Encounter (HOSPITAL_COMMUNITY): Payer: Self-pay

## 2017-02-04 ENCOUNTER — Other Ambulatory Visit: Payer: Self-pay

## 2017-02-04 DIAGNOSIS — E042 Nontoxic multinodular goiter: Secondary | ICD-10-CM | POA: Diagnosis not present

## 2017-02-04 DIAGNOSIS — Z01812 Encounter for preprocedural laboratory examination: Secondary | ICD-10-CM | POA: Diagnosis not present

## 2017-02-04 HISTORY — DX: Personal history of other diseases of the digestive system: Z87.19

## 2017-02-04 HISTORY — DX: Unspecified osteoarthritis, unspecified site: M19.90

## 2017-02-04 HISTORY — DX: Pneumonia, unspecified organism: J18.9

## 2017-02-04 LAB — CBC
HCT: 39.7 % (ref 36.0–46.0)
HEMOGLOBIN: 13.3 g/dL (ref 12.0–15.0)
MCH: 29.7 pg (ref 26.0–34.0)
MCHC: 33.5 g/dL (ref 30.0–36.0)
MCV: 88.6 fL (ref 78.0–100.0)
PLATELETS: 304 10*3/uL (ref 150–400)
RBC: 4.48 MIL/uL (ref 3.87–5.11)
RDW: 14 % (ref 11.5–15.5)
WBC: 8.3 10*3/uL (ref 4.0–10.5)

## 2017-02-07 ENCOUNTER — Observation Stay (HOSPITAL_COMMUNITY)
Admission: RE | Admit: 2017-02-07 | Discharge: 2017-02-08 | Disposition: A | Discharge status: Home / Self Care | Payer: Medicare Other | Source: Ambulatory Visit | Attending: Surgery | Admitting: Surgery

## 2017-02-07 ENCOUNTER — Ambulatory Visit (HOSPITAL_COMMUNITY): Payer: Medicare Other | Admitting: Certified Registered Nurse Anesthetist

## 2017-02-07 ENCOUNTER — Encounter (HOSPITAL_COMMUNITY): Payer: Self-pay | Admitting: Anesthesiology

## 2017-02-07 ENCOUNTER — Other Ambulatory Visit: Payer: Self-pay

## 2017-02-07 ENCOUNTER — Encounter (HOSPITAL_COMMUNITY)
Admission: RE | Disposition: A | Discharge status: Home / Self Care | Payer: Self-pay | Source: Ambulatory Visit | Attending: Surgery

## 2017-02-07 DIAGNOSIS — E041 Nontoxic single thyroid nodule: Secondary | ICD-10-CM | POA: Diagnosis present

## 2017-02-07 DIAGNOSIS — R221 Localized swelling, mass and lump, neck: Secondary | ICD-10-CM | POA: Diagnosis present

## 2017-02-07 DIAGNOSIS — Z882 Allergy status to sulfonamides status: Secondary | ICD-10-CM | POA: Diagnosis not present

## 2017-02-07 DIAGNOSIS — Z8719 Personal history of other diseases of the digestive system: Secondary | ICD-10-CM

## 2017-02-07 DIAGNOSIS — E0520 Thyrotoxicosis with toxic multinodular goiter without thyrotoxic crisis or storm: Secondary | ICD-10-CM | POA: Diagnosis not present

## 2017-02-07 DIAGNOSIS — E042 Nontoxic multinodular goiter: Secondary | ICD-10-CM | POA: Diagnosis present

## 2017-02-07 HISTORY — PX: THYROIDECTOMY: SHX17

## 2017-02-07 SURGERY — THYROIDECTOMY
Anesthesia: General | Site: Neck | Laterality: Left

## 2017-02-07 MED ORDER — HYDROMORPHONE HCL 1 MG/ML IJ SOLN: 0.5000 mg | INTRAMUSCULAR | Status: DC | PRN

## 2017-02-07 MED ORDER — ONDANSETRON HCL 4 MG/2ML IJ SOLN
INTRAMUSCULAR | Status: DC | PRN
  Administered 2017-02-07: 4 mg via INTRAVENOUS

## 2017-02-07 MED ORDER — ACETAMINOPHEN 500 MG PO TABS
1000.0000 mg | ORAL_TABLET | Freq: Three times a day (TID) | ORAL | Status: DC
  Administered 2017-02-07 – 2017-02-08 (×3): 1000 mg via ORAL
  Filled 2017-02-07 (×3): qty 2

## 2017-02-07 MED ORDER — HYDROCORTISONE 1 % EX CREA: 1.0000 "application " | TOPICAL_CREAM | Freq: Three times a day (TID) | CUTANEOUS | Status: DC | PRN

## 2017-02-07 MED ORDER — METOCLOPRAMIDE HCL 5 MG/ML IJ SOLN
10.0000 mg | Freq: Once | INTRAMUSCULAR | Status: AC | PRN
  Administered 2017-02-07: 10 mg via INTRAVENOUS

## 2017-02-07 MED ORDER — LIP MEDEX EX OINT
1.0000 "application " | TOPICAL_OINTMENT | Freq: Two times a day (BID) | CUTANEOUS | Status: DC
  Filled 2017-02-07: qty 7

## 2017-02-07 MED ORDER — ADULT MULTIVITAMIN W/MINERALS CH
1.0000 | ORAL_TABLET | Freq: Every day | ORAL | Status: DC
  Administered 2017-02-08: 08:00:00 1 via ORAL
  Filled 2017-02-07: qty 1

## 2017-02-07 MED ORDER — ASPIRIN-ACETAMINOPHEN-CAFFEINE 250-250-65 MG PO TABS
1.0000 | ORAL_TABLET | Freq: Two times a day (BID) | ORAL | Status: DC | PRN
  Filled 2017-02-07: qty 1

## 2017-02-07 MED ORDER — SUMATRIPTAN SUCCINATE 50 MG PO TABS
50.0000 mg | ORAL_TABLET | ORAL | Status: DC | PRN
  Filled 2017-02-07: qty 1

## 2017-02-07 MED ORDER — CHLORHEXIDINE GLUCONATE CLOTH 2 % EX PADS: 6.0000 | MEDICATED_PAD | Freq: Once | CUTANEOUS | Status: DC

## 2017-02-07 MED ORDER — MIDAZOLAM HCL 5 MG/5ML IJ SOLN
INTRAMUSCULAR | Status: DC | PRN
  Administered 2017-02-07: 2 mg via INTRAVENOUS

## 2017-02-07 MED ORDER — TRAMADOL HCL 50 MG PO TABS: 50.0000 mg | ORAL_TABLET | Freq: Four times a day (QID) | ORAL | 0 refills | Status: DC | PRN

## 2017-02-07 MED ORDER — EPHEDRINE SULFATE-NACL 50-0.9 MG/10ML-% IV SOSY
PREFILLED_SYRINGE | INTRAVENOUS | Status: DC | PRN
  Administered 2017-02-07: 5 mg via INTRAVENOUS

## 2017-02-07 MED ORDER — ACETAMINOPHEN 500 MG PO TABS
1000.0000 mg | ORAL_TABLET | ORAL | Status: AC
  Administered 2017-02-07: 1000 mg via ORAL
  Filled 2017-02-07: qty 2

## 2017-02-07 MED ORDER — MIDAZOLAM HCL 2 MG/2ML IJ SOLN
INTRAMUSCULAR | Status: AC
  Filled 2017-02-07: qty 2

## 2017-02-07 MED ORDER — HYDROCORTISONE 2.5 % RE CREA: 1.0000 "application " | TOPICAL_CREAM | Freq: Four times a day (QID) | RECTAL | Status: DC | PRN

## 2017-02-07 MED ORDER — DIPHENHYDRAMINE HCL 50 MG/ML IJ SOLN: 12.5000 mg | Freq: Four times a day (QID) | INTRAMUSCULAR | Status: DC | PRN

## 2017-02-07 MED ORDER — METOCLOPRAMIDE HCL 5 MG/ML IJ SOLN
INTRAMUSCULAR | Status: AC
  Administered 2017-02-07: 10 mg via INTRAVENOUS
  Filled 2017-02-07: qty 2

## 2017-02-07 MED ORDER — PROPOFOL 10 MG/ML IV BOLUS
INTRAVENOUS | Status: DC | PRN
  Administered 2017-02-07: 150 mg via INTRAVENOUS

## 2017-02-07 MED ORDER — GABAPENTIN 300 MG PO CAPS
300.0000 mg | ORAL_CAPSULE | ORAL | Status: AC
Start: 2017-02-07 — End: 2017-02-07
  Administered 2017-02-07: 300 mg via ORAL
  Filled 2017-02-07: qty 1

## 2017-02-07 MED ORDER — PHENOL 1.4 % MT LIQD: 1.0000 | OROMUCOSAL | Status: DC | PRN

## 2017-02-07 MED ORDER — PROPOFOL 10 MG/ML IV BOLUS
INTRAVENOUS | Status: AC
  Filled 2017-02-07: qty 20

## 2017-02-07 MED ORDER — LORAZEPAM 0.5 MG PO TABS: 0.5000 mg | ORAL_TABLET | Freq: Every day | ORAL | Status: DC | PRN

## 2017-02-07 MED ORDER — GABAPENTIN 300 MG PO CAPS
300.0000 mg | ORAL_CAPSULE | Freq: Every day | ORAL | Status: DC
  Administered 2017-02-07: 300 mg via ORAL
  Filled 2017-02-07: qty 1

## 2017-02-07 MED ORDER — HYDROMORPHONE HCL 1 MG/ML IJ SOLN
0.2500 mg | INTRAMUSCULAR | Status: DC | PRN
  Administered 2017-02-07 (×2): 0.25 mg via INTRAVENOUS
  Administered 2017-02-07: 0.5 mg via INTRAVENOUS

## 2017-02-07 MED ORDER — SODIUM CHLORIDE 0.9% FLUSH: 3.0000 mL | INTRAVENOUS | Status: DC | PRN

## 2017-02-07 MED ORDER — METOCLOPRAMIDE HCL 5 MG/ML IJ SOLN: 5.0000 mg | Freq: Four times a day (QID) | INTRAMUSCULAR | Status: DC | PRN

## 2017-02-07 MED ORDER — GUAIFENESIN-DM 100-10 MG/5ML PO SYRP: 10.0000 mL | ORAL_SOLUTION | ORAL | Status: DC | PRN

## 2017-02-07 MED ORDER — ENOXAPARIN SODIUM 40 MG/0.4ML ~~LOC~~ SOLN
40.0000 mg | SUBCUTANEOUS | Status: DC
  Administered 2017-02-08: 08:00:00 40 mg via SUBCUTANEOUS
  Filled 2017-02-07 (×2): qty 0.4

## 2017-02-07 MED ORDER — FENTANYL CITRATE (PF) 100 MCG/2ML IJ SOLN
INTRAMUSCULAR | Status: DC | PRN
  Administered 2017-02-07 (×5): 50 ug via INTRAVENOUS

## 2017-02-07 MED ORDER — ROCURONIUM BROMIDE 50 MG/5ML IV SOSY
PREFILLED_SYRINGE | INTRAVENOUS | Status: AC
  Filled 2017-02-07: qty 5

## 2017-02-07 MED ORDER — DEXAMETHASONE SODIUM PHOSPHATE 4 MG/ML IJ SOLN
INTRAMUSCULAR | Status: DC | PRN
  Administered 2017-02-07: 4 mg via INTRAVENOUS

## 2017-02-07 MED ORDER — DIPHENHYDRAMINE HCL 12.5 MG/5ML PO ELIX: 12.5000 mg | ORAL_SOLUTION | Freq: Four times a day (QID) | ORAL | Status: DC | PRN

## 2017-02-07 MED ORDER — DEXAMETHASONE SODIUM PHOSPHATE 10 MG/ML IJ SOLN
INTRAMUSCULAR | Status: AC
  Filled 2017-02-07: qty 1

## 2017-02-07 MED ORDER — PHENYLEPHRINE 40 MCG/ML (10ML) SYRINGE FOR IV PUSH (FOR BLOOD PRESSURE SUPPORT)
PREFILLED_SYRINGE | INTRAVENOUS | Status: AC
  Filled 2017-02-07: qty 10

## 2017-02-07 MED ORDER — HYDROMORPHONE HCL 1 MG/ML IJ SOLN
INTRAMUSCULAR | Status: AC
  Administered 2017-02-07: 0.25 mg via INTRAVENOUS
  Filled 2017-02-07: qty 1

## 2017-02-07 MED ORDER — SODIUM CHLORIDE 0.9% FLUSH: 3.0000 mL | Freq: Two times a day (BID) | INTRAVENOUS | Status: DC

## 2017-02-07 MED ORDER — ONDANSETRON 4 MG PO TBDP: 4.0000 mg | ORAL_TABLET | Freq: Four times a day (QID) | ORAL | Status: DC | PRN

## 2017-02-07 MED ORDER — ASPIRIN EC 81 MG PO TBEC
81.0000 mg | DELAYED_RELEASE_TABLET | Freq: Every day | ORAL | Status: DC | PRN
Start: 2017-02-07 — End: 2017-02-07

## 2017-02-07 MED ORDER — SODIUM CHLORIDE 0.9 % IV SOLN
INTRAVENOUS | Status: DC
  Administered 2017-02-07: 13:00:00 via INTRAVENOUS

## 2017-02-07 MED ORDER — LACTATED RINGERS IV SOLN: 1000.0000 mL | Freq: Three times a day (TID) | INTRAVENOUS | Status: DC | PRN

## 2017-02-07 MED ORDER — ACETAMINOPHEN 500 MG PO TABS: 500.0000 mg | ORAL_TABLET | Freq: Four times a day (QID) | ORAL | Status: DC | PRN

## 2017-02-07 MED ORDER — CEFAZOLIN SODIUM-DEXTROSE 2-4 GM/100ML-% IV SOLN
2.0000 g | INTRAVENOUS | Status: AC
  Administered 2017-02-07: 2 g via INTRAVENOUS
  Filled 2017-02-07: qty 100

## 2017-02-07 MED ORDER — SODIUM CHLORIDE 0.9 % IV SOLN: 250.0000 mL | INTRAVENOUS | Status: DC | PRN

## 2017-02-07 MED ORDER — ONDANSETRON HCL 4 MG/2ML IJ SOLN
INTRAMUSCULAR | Status: AC
  Filled 2017-02-07: qty 2

## 2017-02-07 MED ORDER — TRAMADOL HCL 50 MG PO TABS
50.0000 mg | ORAL_TABLET | Freq: Four times a day (QID) | ORAL | Status: DC | PRN
  Administered 2017-02-07: 21:00:00 50 mg via ORAL
  Administered 2017-02-07: 16:00:00 100 mg via ORAL
  Administered 2017-02-08: 02:00:00 50 mg via ORAL
  Administered 2017-02-08: 11:00:00 100 mg via ORAL
  Filled 2017-02-07: qty 2
  Filled 2017-02-07: qty 1
  Filled 2017-02-07: qty 2
  Filled 2017-02-07: qty 1

## 2017-02-07 MED ORDER — MEPERIDINE HCL 50 MG/ML IJ SOLN
6.2500 mg | INTRAMUSCULAR | Status: DC | PRN
Start: 2017-02-07 — End: 2017-02-07

## 2017-02-07 MED ORDER — POLYETHYLENE GLYCOL 3350 17 G PO PACK: 17.0000 g | PACK | Freq: Every day | ORAL | Status: DC | PRN

## 2017-02-07 MED ORDER — MAGIC MOUTHWASH
15.0000 mL | Freq: Four times a day (QID) | ORAL | Status: DC | PRN
  Filled 2017-02-07: qty 15

## 2017-02-07 MED ORDER — 0.9 % SODIUM CHLORIDE (POUR BTL) OPTIME
TOPICAL | Status: DC | PRN
  Administered 2017-02-07: 1000 mL

## 2017-02-07 MED ORDER — SUGAMMADEX SODIUM 200 MG/2ML IV SOLN
INTRAVENOUS | Status: DC | PRN
  Administered 2017-02-07: 200 mg via INTRAVENOUS

## 2017-02-07 MED ORDER — BUPIVACAINE-EPINEPHRINE (PF) 0.25% -1:200000 IJ SOLN
INTRAMUSCULAR | Status: DC | PRN
  Administered 2017-02-07: 40 mL via PERINEURAL

## 2017-02-07 MED ORDER — ROCURONIUM BROMIDE 50 MG/5ML IV SOSY
PREFILLED_SYRINGE | INTRAVENOUS | Status: DC | PRN
  Administered 2017-02-07 (×2): 10 mg via INTRAVENOUS
  Administered 2017-02-07: 50 mg via INTRAVENOUS

## 2017-02-07 MED ORDER — EPHEDRINE 5 MG/ML INJ
INTRAVENOUS | Status: AC
  Filled 2017-02-07: qty 10

## 2017-02-07 MED ORDER — BISACODYL 10 MG RE SUPP: 10.0000 mg | Freq: Every day | RECTAL | Status: DC | PRN

## 2017-02-07 MED ORDER — BUPIVACAINE-EPINEPHRINE 0.25% -1:200000 IJ SOLN
INTRAMUSCULAR | Status: AC
  Filled 2017-02-07: qty 1

## 2017-02-07 MED ORDER — ZOLPIDEM TARTRATE 5 MG PO TABS: 5.0000 mg | ORAL_TABLET | Freq: Every evening | ORAL | Status: DC | PRN

## 2017-02-07 MED ORDER — ONDANSETRON HCL 4 MG/2ML IJ SOLN
4.0000 mg | Freq: Four times a day (QID) | INTRAMUSCULAR | Status: DC | PRN
  Administered 2017-02-08: 08:00:00 4 mg via INTRAVENOUS
  Filled 2017-02-07: qty 2

## 2017-02-07 MED ORDER — HYDRALAZINE HCL 20 MG/ML IJ SOLN: 5.0000 mg | INTRAMUSCULAR | Status: DC | PRN

## 2017-02-07 MED ORDER — LIDOCAINE 2% (20 MG/ML) 5 ML SYRINGE
INTRAMUSCULAR | Status: AC
  Filled 2017-02-07: qty 5

## 2017-02-07 MED ORDER — FENTANYL CITRATE (PF) 250 MCG/5ML IJ SOLN
INTRAMUSCULAR | Status: AC
  Filled 2017-02-07: qty 5

## 2017-02-07 MED ORDER — LIDOCAINE 2% (20 MG/ML) 5 ML SYRINGE
INTRAMUSCULAR | Status: DC | PRN
  Administered 2017-02-07: 100 mg via INTRAVENOUS

## 2017-02-07 MED ORDER — SIMETHICONE 80 MG PO CHEW: 40.0000 mg | CHEWABLE_TABLET | Freq: Four times a day (QID) | ORAL | Status: DC | PRN

## 2017-02-07 MED ORDER — LEVOTHYROXINE SODIUM 50 MCG PO TABS
50.0000 ug | ORAL_TABLET | Freq: Every day | ORAL | Status: DC
  Administered 2017-02-08: 08:00:00 50 ug via ORAL
  Filled 2017-02-07: qty 1

## 2017-02-07 MED ORDER — MENTHOL 3 MG MT LOZG: 1.0000 | LOZENGE | OROMUCOSAL | Status: DC | PRN

## 2017-02-07 MED ORDER — SUGAMMADEX SODIUM 200 MG/2ML IV SOLN
INTRAVENOUS | Status: AC
  Filled 2017-02-07: qty 2

## 2017-02-07 MED ORDER — LACTATED RINGERS IV SOLN
INTRAVENOUS | Status: DC
  Administered 2017-02-07: 1000 mL via INTRAVENOUS

## 2017-02-07 MED ORDER — ALUM & MAG HYDROXIDE-SIMETH 200-200-20 MG/5ML PO SUSP: 30.0000 mL | Freq: Four times a day (QID) | ORAL | Status: DC | PRN

## 2017-02-07 MED ORDER — METHOCARBAMOL 500 MG PO TABS
500.0000 mg | ORAL_TABLET | Freq: Four times a day (QID) | ORAL | Status: DC | PRN
  Administered 2017-02-08: 500 mg via ORAL
  Filled 2017-02-07: qty 1

## 2017-02-07 MED ORDER — PHENYLEPHRINE 40 MCG/ML (10ML) SYRINGE FOR IV PUSH (FOR BLOOD PRESSURE SUPPORT)
PREFILLED_SYRINGE | INTRAVENOUS | Status: DC | PRN
  Administered 2017-02-07: 80 ug via INTRAVENOUS
  Administered 2017-02-07 (×3): 40 ug via INTRAVENOUS

## 2017-02-07 SURGICAL SUPPLY — 40 items
APL SKNCLS STERI-STRIP NONHPOA (GAUZE/BANDAGES/DRESSINGS) ×1
ATTRACTOMAT 16X20 MAGNETIC DRP (DRAPES) ×3 IMPLANT
BENZOIN TINCTURE PRP APPL 2/3 (GAUZE/BANDAGES/DRESSINGS) ×3 IMPLANT
BLADE SURG 15 STRL LF DISP TIS (BLADE) ×1 IMPLANT
BLADE SURG 15 STRL SS (BLADE) ×3
CHLORAPREP W/TINT 26ML (MISCELLANEOUS) ×3 IMPLANT
CLIP VESOCCLUDE MED 6/CT (CLIP) ×8 IMPLANT
CLIP VESOCCLUDE SM WIDE 6/CT (CLIP) ×6 IMPLANT
CLOSURE WOUND 1/2 X4 (GAUZE/BANDAGES/DRESSINGS) ×1
COVER SURGICAL LIGHT HANDLE (MISCELLANEOUS) ×3 IMPLANT
DISSECTOR ROUND CHERRY 3/8 STR (MISCELLANEOUS) ×2 IMPLANT
DRAPE LAPAROTOMY T 98X78 PEDS (DRAPES) ×3 IMPLANT
ELECT PENCIL ROCKER SW 15FT (MISCELLANEOUS) ×3 IMPLANT
ELECT REM PT RETURN 15FT ADLT (MISCELLANEOUS) ×3 IMPLANT
GAUZE SPONGE 2X2 8PLY STRL LF (GAUZE/BANDAGES/DRESSINGS) IMPLANT
GAUZE SPONGE 4X4 12PLY STRL (GAUZE/BANDAGES/DRESSINGS) ×3 IMPLANT
GAUZE SPONGE 4X4 16PLY XRAY LF (GAUZE/BANDAGES/DRESSINGS) ×6 IMPLANT
GLOVE ECLIPSE 8.0 STRL XLNG CF (GLOVE) ×3 IMPLANT
GLOVE INDICATOR 8.0 STRL GRN (GLOVE) ×3 IMPLANT
GOWN STRL REUS W/TWL XL LVL3 (GOWN DISPOSABLE) ×9 IMPLANT
HEMOSTAT SURGICEL 2X4 FIBR (HEMOSTASIS) ×1 IMPLANT
KIT BASIN OR (CUSTOM PROCEDURE TRAY) ×3 IMPLANT
NEEDLE HYPO 22GX1.5 SAFETY (NEEDLE) ×3 IMPLANT
PACK BASIC VI WITH GOWN DISP (CUSTOM PROCEDURE TRAY) ×3 IMPLANT
SHEARS HARMONIC 9CM CVD (BLADE) ×2 IMPLANT
SPONGE GAUZE 2X2 STER 10/PKG (GAUZE/BANDAGES/DRESSINGS) ×2
STAPLER VISISTAT 35W (STAPLE) IMPLANT
STRIP CLOSURE SKIN 1/2X4 (GAUZE/BANDAGES/DRESSINGS) ×1 IMPLANT
SUT MNCRL AB 4-0 PS2 18 (SUTURE) ×3 IMPLANT
SUT SILK 2 0 (SUTURE)
SUT SILK 2-0 18XBRD TIE 12 (SUTURE) IMPLANT
SUT SILK 3 0 (SUTURE)
SUT SILK 3-0 18XBRD TIE 12 (SUTURE) IMPLANT
SUT VIC AB 3-0 SH 18 (SUTURE) ×6 IMPLANT
SYR 20CC LL (SYRINGE) ×3 IMPLANT
SYR BULB IRRIGATION 50ML (SYRINGE) ×3 IMPLANT
TAPE CLOTH SURG 4X10 WHT LF (GAUZE/BANDAGES/DRESSINGS) ×2 IMPLANT
TOWEL OR 17X26 10 PK STRL BLUE (TOWEL DISPOSABLE) ×3 IMPLANT
TOWEL OR NON WOVEN STRL DISP B (DISPOSABLE) ×3 IMPLANT
YANKAUER SUCT BULB TIP 10FT TU (MISCELLANEOUS) ×3 IMPLANT

## 2017-02-07 NOTE — Anesthesia Preprocedure Evaluation (Signed)
Anesthesia Evaluation    Airway Mallampati: II  TM Distance: >3 FB Neck ROM: Full    Dental no notable dental hx. (+) Teeth Intact   Pulmonary pneumonia, resolved,    Pulmonary exam normal breath sounds clear to auscultation       Cardiovascular negative cardio ROS Normal cardiovascular exam Rhythm:Regular Rate:Normal     Neuro/Psych  Headaches, negative psych ROS   GI/Hepatic Neg liver ROS, hiatal hernia,   Endo/Other  Hyperthyroidism Bilateral thyroid nodules- multinodular goiter Obesity  Renal/GU negative Renal ROS  negative genitourinary   Musculoskeletal  (+) Arthritis ,   Abdominal (+) + obese,   Peds  Hematology negative hematology ROS (+)   Anesthesia Other Findings   Reproductive/Obstetrics                             Anesthesia Physical Anesthesia Plan  ASA: II  Anesthesia Plan: General   Post-op Pain Management:    Induction: Intravenous  PONV Risk Score and Plan: 4 or greater and Ondansetron, Treatment may vary due to age or medical condition and Dexamethasone  Airway Management Planned: Video Laryngoscope Planned and Oral ETT  Additional Equipment:   Intra-op Plan:   Post-operative Plan: Extubation in OR  Informed Consent: I have reviewed the patients History and Physical, chart, labs and discussed the procedure including the risks, benefits and alternatives for the proposed anesthesia with the patient or authorized representative who has indicated his/her understanding and acceptance.   Dental advisory given  Plan Discussed with: CRNA, Anesthesiologist and Surgeon  Anesthesia Plan Comments:         Anesthesia Quick Evaluation

## 2017-02-07 NOTE — Anesthesia Procedure Notes (Signed)
Procedure Name: Intubation Date/Time: 02/07/2017 11:01 AM Performed by: Deliah Boston, CRNA Pre-anesthesia Checklist: Patient identified, Emergency Drugs available, Suction available and Patient being monitored Patient Re-evaluated:Patient Re-evaluated prior to induction Oxygen Delivery Method: Circle system utilized Preoxygenation: Pre-oxygenation with 100% oxygen Induction Type: IV induction Ventilation: Mask ventilation without difficulty Laryngoscope Size: Glidescope, Mac and 3 Grade View: Grade I Tube type: Oral Tube size: 7.0 mm Number of attempts: 1 Airway Equipment and Method: Oral airway and Video-laryngoscopy Placement Confirmation: ETT inserted through vocal cords under direct vision,  positive ETCO2 and breath sounds checked- equal and bilateral Secured at: 21 cm Tube secured with: Tape Dental Injury: Teeth and Oropharynx as per pre-operative assessment

## 2017-02-07 NOTE — Discharge Instructions (Signed)
GENERAL SURGERY: POST OP INSTRUCTIONS ° °###################################################################### ° °EAT °Gradually transition to a high fiber diet with a fiber supplement over the next few weeks after discharge.  Start with a pureed / full liquid diet (see below) ° °WALK °Walk an hour a day.  Control your pain to do that.   ° °CONTROL PAIN °Control pain so that you can walk, sleep, tolerate sneezing/coughing, go up/down stairs. ° °HAVE A BOWEL MOVEMENT DAILY °Keep your bowels regular to avoid problems.  OK to try a laxative to override constipation.  OK to use an antidairrheal to slow down diarrhea.  Call if not better after 2 tries ° °CALL IF YOU HAVE PROBLEMS/CONCERNS °Call if you are still struggling despite following these instructions. °Call if you have concerns not answered by these instructions ° °###################################################################### ° ° ° °1. DIET: Follow a light bland diet the first 24 hours after arrival home, such as soup, liquids, crackers, etc.  Be sure to include lots of fluids daily.  Avoid fast food or heavy meals as your are more likely to get nauseated.   °2. Take your usually prescribed home medications unless otherwise directed. °3. PAIN CONTROL: °a. Pain is best controlled by a usual combination of three different methods TOGETHER: °i. Ice/Heat °ii. Over the counter pain medication °iii. Prescription pain medication °b. Most patients will experience some swelling and bruising around the incisions.  Ice packs or heating pads (30-60 minutes up to 6 times a day) will help. Use ice for the first few days to help decrease swelling and bruising, then switch to heat to help relax tight/sore spots and speed recovery.  Some people prefer to use ice alone, heat alone, alternating between ice & heat.  Experiment to what works for you.  Swelling and bruising can take several weeks to resolve.   °c. It is helpful to take an over-the-counter pain medication  regularly for the first few weeks.  Choose one of the following that works best for you: °i. Naproxen (Aleve, etc)  Two 220mg tabs twice a day °ii. Ibuprofen (Advil, etc) Three 200mg tabs four times a day (every meal & bedtime) °iii. Acetaminophen (Tylenol, etc) 500-650mg four times a day (every meal & bedtime) °d. A  prescription for pain medication (such as oxycodone, hydrocodone, etc) should be given to you upon discharge.  Take your pain medication as prescribed.  °i. If you are having problems/concerns with the prescription medicine (does not control pain, nausea, vomiting, rash, itching, etc), please call us [phone] to see if we need to switch you to a different pain medicine that will work better for you and/or control your side effect better. °ii. If you need a refill on your pain medication, please contact your pharmacy.  They will contact our office to request authorization. Prescriptions will not be filled after 5 pm or on week-ends. °4. Avoid getting constipated.  Between the surgery and the pain medications, it is common to experience some constipation.  Increasing fluid intake and taking a fiber supplement (such as Metamucil, Citrucel, FiberCon, MiraLax, etc) 1-2 times a day regularly will usually help prevent this problem from occurring.  A mild laxative (prune juice, Milk of Magnesia, MiraLax, etc) should be taken according to package directions if there are no bowel movements after 48 hours.   °5. Wash / shower every day.  You may shower over the dressings as they are waterproof.  Continue to shower over incision(s) after the dressing is off. °6. Remove your waterproof bandages   5 days after surgery.  You may leave the incision open to air.  You may have skin tapes (Steri Strips) covering the incision(s).  Leave them on until one week, then remove.  You may replace a dressing/Band-Aid to cover the incision for comfort if you wish.      7. ACTIVITIES as tolerated:   a. You may resume  regular (light) daily activities beginning the next day--such as daily self-care, walking, climbing stairs--gradually increasing activities as tolerated.  If you can walk 30 minutes without difficulty, it is safe to try more intense activity such as jogging, treadmill, bicycling, low-impact aerobics, swimming, etc. b. Save the most intensive and strenuous activity for last such as sit-ups, heavy lifting, contact sports, etc  Refrain from any heavy lifting or straining until you are off narcotics for pain control.   c. DO NOT PUSH THROUGH PAIN.  Let pain be your guide: If it hurts to do something, don't do it.  Pain is your body warning you to avoid that activity for another week until the pain goes down. d. You may drive when you are no longer taking prescription pain medication, you can comfortably wear a seatbelt, and you can safely maneuver your car and apply brakes. e. Dennis Bast may have sexual intercourse when it is comfortable.  8. FOLLOW UP in our office a. Please call [facility] at [phone] to set up an appointment to see your surgeon in the office for a follow-up appointment approximately 2-3 weeks after your surgery. b. Make sure that you call for this appointment the day you arrive home to insure a convenient appointment time. 9. IF YOU HAVE DISABILITY OR FAMILY LEAVE FORMS, BRING THEM TO THE OFFICE FOR PROCESSING.  DO NOT GIVE THEM TO YOUR DOCTOR.   WHEN TO CALL us [phone]: 1. Poor pain control 2. Reactions / problems with new medications (rash/itching, nausea, etc)  3. Fever over 101.5 F (38.5 C) 4. Worsening swelling or bruising 5. Continued bleeding from incision. 6. Increased pain, redness, or drainage from the incision 7. Difficulty breathing / swallowing   The clinic staff is available to answer your questions during regular business hours (8:30am-5pm).  Please dont hesitate to call and ask to speak to one of our nurses for clinical concerns.   If you have a medical emergency,  go to the nearest emergency room or call 911.  A surgeon from [facility] is always on call at the [facility], [place], [place], [place], [place]  [address] ? MAIN: [phone] ? TOLL FREE: [phone] ?  FAX [phone] [email or web address]   Thyroid Lobectomy A thyroid lobectomy is a surgery to remove a part or a whole section (lobe) of the thyroid gland. The thyroid gland is a butterfly-shaped gland at the base of your neck. It produces a substance that helps to control certain body processes (thyroid hormone). The thyroid gland has two lobes, one on each side of the windpipe (trachea). The lobes joined together by a section of tissue (thyroid isthmus). You may have a thyroid lobectomy:  To remove a noncancerous (benign) tumor or a cancerous (malignant) tumor of the thyroid.  To control thyroid hormone overproduction (hyperthyroidism).  You may have:  A conventional thyroid lobectomy (open lobectomy). This is the most common type. In this procedure, the thyroid gland is removed through one surgical cut (incision) in the neck.  An endoscopic thyroid lobectomy. This procedure is less invasive. It requires  several smaller incisions in the neck, chest, or armpit. The surgeon uses a tiny camera and other assistive tools to operate on the thyroid gland.  Tell a health care provider about:  Any allergies you have.  All medicines you are taking, including vitamins, herbs, eye drops, creams, and over-the-counter medicines.  Any problems you or family members have had with anesthetic medicines.  Any blood disorders you have.  Any surgeries you have had.  Any medical conditions you have. What are the risks? Generally this is a safe procedure. However, problems can occur and include:  Infection.  Bleeding.  Hoarseness or vocal cord damage.  Difficulty swallowing or eating.  Damage to the glands that  control the calcium level in your body (parathyroid glands).  Scar formation.  Overproduction of thyroid hormone.  Temporary breathing difficulties. This is a very rare complication and usually goes away within weeks.  What happens before the procedure?  Your health care provider will perform a physical exam and assess your voice for vocal changes.  Ask your health care provider about: ? Changing or stopping your regular medicines. This is especially important if you are taking diabetes medicines or blood thinners. ? Taking medicines such as aspirin and ibuprofen. These medicines can thin your blood. Do not take these medicines before your procedure if your health care provider instructs you not to.  Follow instructions from your health care provider about eating or drinking restrictions. What happens during the procedure? You will be given a medicine that makes you go to sleep (general anesthetic). The steps of each type of thyroid lobectomy are listed below: Conventional Thyroid Lobectomy  The surgeon will make an incision just above where your collarbone meets your breastbone.  The muscles in your neck will be separated to access your thyroid gland.  The damaged or diseased part of the gland will be identified and removed.  Your surgeon will find and preserve the nerves of your voice box (larynx) and protect the four parathyroid glands that are located close to the thyroid gland.  The removed part of your thyroid gland will be examined under a microscope. ? If no cancer is found, the incision will be closed with stitches (sutures). ? If cancer is found, it may be necessary to remove your thyroid gland entirely (total thyroidectomy). Some or all of the lymph nodes in your neck may also need to be removed.  You may need a tube (catheter) at the incision site to drain blood and fluids that accumulate under your skin after the procedure. This may have to stay in place for a day or two  after the procedure.  The incision will be closed with stitches (sutures). Endoscopic Thyroid Lobectomy  The surgeon will make several small incisions in your neck, chest, or armpit.  The surgeon will insert a narrow tube with a light and a camera on the end (endoscope) into an incision.  Surgical instruments will be inserted into other incisions to remove part or all of your thyroid gland.  You may need a tube (catheter) at the incision site to drain blood and fluids that accumulate under your skin after the procedure.This may have to stay in place for a day or two after the procedure.  The incision will be closed with stitches (sutures). What happens after the procedure?  You will be taken to a recovery room. Your blood pressure, heart rate, breathing rate, and blood oxygen level will be monitored often until the medicines you were given have  worn off.  You might feel some pain in your incision area or areas.  Your throat may be sore.  You may have a blood test to check the level of calcium in your body.  If you had a catheter put in during the procedure, it will usually be removed the next day.  You may be able to start drinking liquids at first. If this does not cause any problems, you will be able to slowly start eating what you usually eat. This information is not intended to replace advice given to you by your health care provider. Make sure you discuss any questions you have with your health care provider. Document Released: 04/01/2007 Document Revised: 11/13/2015 Document Reviewed: 08/12/2013 Elsevier Interactive Patient Education  2018 [employer].

## 2017-02-07 NOTE — Interval H&P Note (Signed)
History and Physical Interval Note:  02/07/2017 10:47 AM  Amber Rivas  has presented today for surgery, with the diagnosis of LEFT THYROID NODULE, [name]. RIGHT THYROID NODULE   The various methods of treatment have been discussed with the patient and family. After consideration of risks, benefits and other options for treatment, the patient has consented to  Procedure(s) with comments: TOTAL THYROIDECTOMY (Left) - GENERAL AND LOCAL  as a surgical intervention .  The patient's history has been reviewed, patient examined, no change in status, stable for surgery.  I have reviewed the patient's chart and labs.  Questions were answered to the patient's satisfaction.     Ofilia Rayon C.

## 2017-02-07 NOTE — Anesthesia Postprocedure Evaluation (Signed)
Anesthesia Post Note  Patient: Amber Rivas  Procedure(s) Performed: TOTAL THYROIDECTOMY (Left Neck)     Patient location during evaluation: PACU Anesthesia Type: General Level of consciousness: awake and alert and oriented Pain management: pain level controlled Vital Signs Assessment: post-procedure vital signs reviewed and stable Respiratory status: spontaneous breathing, nonlabored ventilation, respiratory function stable and patient connected to nasal cannula oxygen Cardiovascular status: blood pressure returned to baseline and stable Postop Assessment: no apparent nausea or vomiting Anesthetic complications: no    Last Vitals:  Vitals:   02/07/17 1315 02/07/17 1330  BP: (!) 151/84 (!) 145/80  Pulse: 73 70  Resp: 17 17  Temp:    SpO2: 97% 98%    Last Pain:  Vitals:   02/07/17 1330  TempSrc:   PainSc: 4                  Jaison Petraglia A.

## 2017-02-07 NOTE — Op Note (Signed)
02/07/2017  12:39 PM  PATIENT:  Amber Rivas  71 y.o. female  Patient Care Team: Eustaquio Maize, MD as PCP - General (Pediatrics) Renato Shin, MD as Consulting Physician (Endocrinology) Wylene Simmer, MD as Consulting Physician (Orthopedic Surgery) Michael Boston, MD as Consulting Physician (General Surgery)  PRE-OPERATIVE DIAGNOSIS:  LEFT THYROID NODULE, [name]. RIGHT THYROID NODULE   POST-OPERATIVE DIAGNOSIS:    LEFT THYROID NODULES [name] RIGHT THYROID NODULE   PROCEDURE:  TOTAL THYROIDECTOMY  SURGEON:  Adin Hector, MD  ASSISTANT: Jackolyn Confer, MD   ANESTHESIA:   local and general  EBL:  Total I/O In: -  Out: 15 [Blood:15]  Delay start of Pharmacological VTE agent (>24hrs) due to surgical blood loss or risk of bleeding:  no  DRAINS: none   SPECIMEN:  THYROID GLAND with nodules (stitch in left upper pole)  DISPOSITION OF SPECIMEN:  PATHOLOGY  COUNTS:  YES  PLAN OF CARE: Admit for overnight observation  PATIENT DISPOSITION:  PACU - hemodynamically stable.  INDICATION:   Patient with thyroid nodules noted left side biopsy initially QNS and then concerning for a Bethesda class III.  Contralateral right-sided nodule as well.  Already on thyroid hormone medication for hypothyroidism for 40 years.  Concern for multinodular goiter with atypical nodule.  Recommendation made for hemithyroidectomy with low threshold for total thyroidectomy.  She wished to proceed for total.    The anatomy and physiology of the thyroid gland and organs of the neck were discussed.  Pathophysiology of thyroid problems were discussed.  Options were discussed, and I made a recommendation to remove part (and possibly all) of the thyroid gland to treat the pathology.    Risks of bleeding, infection, injury to other organs including nerves, reoperation, death, and other risks were discussed.   I noted a good likelihood this will help address the problem.  While there are risks,  I feel the risks of nonoperative management are greater; therefore, I feel surgery offers the best option. Educational material was given to help further explain the topics & concerns from our discussion.  We will work to minimize complications.    OR FINDINGS: Large left thyroid gland, 3 times larger than the right side, predominance of fullness in inferior pole.  Hard nodule in left superior pole as well.  Fullness suspicious for nodule in the right inferior pole.  DESCRIPTION:   The patient was brought to the operating room and placed in a supine position on the operating room table. Following administration of general anesthesia, the patient was positioned and then prepped and draped in the usual aseptic fashion. After ascertaining that an adequate level of anesthesia had been achieved, a Kocher incision transversely across the anterior neck was made with #15 blade. Dissection was carried through subcutaneous tissues and platysma. Hemostasis was achieved with the electrocautery. Skin flaps were elevated cephalad and caudad from the thyroid notch to the sternal notch. A self-retaining retractor was placed for exposure. Strap muscles were incised in the midline and dissection was begun on the right side. Strap muscles were reflected laterally.   The thyroid gland was inspected.  Firm nodular mass on the left superior pole.  Even more fullness on the left inferior pole going down posterior to the clavicle.  Smooth but enlarged. The left lobe was gently mobilized with blunt dissection. Superior pole vessels were dissected out and divided individually between small and medium Ligaclips with the Harmonic scalpel. The thyroid lobe was rolled anteriorly. Branches of the  inferior thyroid artery were divided between small Ligaclips with the Harmonic scalpel. Inferior venous tributaries were divided between Ligaclips. Both the superior and inferior parathyroid glands were identified and preserved on  their vascular pedicles. Thyroid gland appeared rather inflamed concerning for chronic thyroiditis.  The recurrent laryngeal nerve was identified and preserved along its course. Dr. Zella Richer was needed given the large deep left thyroid lobe to help with dissection and exposure.  This mobilized the entire left thyroid to free the isthmus off the anterior trachea.  Small vertical pyramidal of the lobe freed off the left anterior trachea.  Mobilized towards the right anterior trachea.  Switch sides.  Dissected and removed the right thyroid gland in a similar fashion.  Much smaller.  Almost seems slightly atrophic.  The right superior parathyroid gland was more medial and on the trachea, and we carefully preserved it.  A smaller but definite inferior right parathyroid gland preserved as well off the right inferior thyroid lobe.  Some firmness in the inferior pole consistent with the suspicious right thyroid nodule.  The ligament of Gwenlyn Found was released with harmonic dissection after meticulous dissection and staying away from the nerves.  This released the last of the thyroid gland lobe & isthmus.  The wounds were packed for gentle pressure.  I placed a stitch in the left superior pole and submitted the gland to pathology for review.  The neck was irrigated with warm saline. We assured hemostasis in the wound with a few small clips.  Surgicel Fibrillar was placed throughout the operative field for additional hemostasis protection. Strap muscles were reapproximated in the midline with interrupted Vicryl sutures. Platysma was closed with interrupted Vicryl sutures. Skin was closed with a running 4-0 Monocryl subcuticular suture. Wound was washed and steri-strips were applied. Dry gauze dressing was placed. The patient was awakened from anesthesia and brought to the recovery room. The patient tolerated the procedure well.  I discussed operative findings, updated the patient's status, discussed probable  steps to recovery, and gave postoperative recommendations to the patient's spouse.  Recommendations were made.  Questions were answered.  He expressed understanding & appreciation.    Adin Hector, M.D., F.A.C.S. Gastrointestinal and Minimally Invasive Surgery [facility] [address], [place], [place] [address] [phone] Main / Paging

## 2017-02-07 NOTE — Transfer of Care (Signed)
Immediate Anesthesia Transfer of Care Note  Patient: Amber Rivas  Procedure(s) Performed: Procedure(s) with comments: TOTAL THYROIDECTOMY (Left) - GENERAL AND LOCAL   Patient Location: PACU  Anesthesia Type:General  Level of Consciousness: Patient easily awoken, sedated, comfortable, cooperative, following commands, responds to stimulation.   Airway & Oxygen Therapy: Patient spontaneously breathing, ventilating well, oxygen via simple oxygen mask.  Post-op Assessment: Report given to PACU RN, vital signs reviewed and stable, moving all extremities.   Post vital signs: Reviewed and stable.  Complications: No apparent anesthesia complications  Last Vitals:  Vitals:   02/07/17 0936  BP: (!) 154/102  Pulse: 78  Resp: 16  Temp: 37.1 C  SpO2: 97%    Last Pain:  Vitals:   02/07/17 0936  TempSrc: Oral      Patients Stated Pain Goal: 3 (31/12/16 2446)  Complications: No apparent anesthesia complications

## 2017-02-08 ENCOUNTER — Encounter (HOSPITAL_COMMUNITY): Payer: Self-pay | Admitting: Surgery

## 2017-02-08 DIAGNOSIS — E0520 Thyrotoxicosis with toxic multinodular goiter without thyrotoxic crisis or storm: Secondary | ICD-10-CM | POA: Diagnosis not present

## 2017-02-08 LAB — PHOSPHORUS: PHOSPHORUS: 2.7 mg/dL (ref 2.5–4.6)

## 2017-02-08 LAB — MAGNESIUM: MAGNESIUM: 2.1 mg/dL (ref 1.7–2.4)

## 2017-02-08 LAB — CALCIUM: CALCIUM: 9 mg/dL (ref 8.9–10.3)

## 2017-02-08 NOTE — Discharge Summary (Signed)
Physician Discharge Summary  Patient ID: Amber Rivas MRN: [id number] DOB/AGE: March 16, 1946  71 y.o.  Admit date: 02/07/2017 Discharge date: 02/08/2017   Patient Care Team: Eustaquio Maize, MD as PCP - General (Pediatrics) Renato Shin, MD as Consulting Physician (Endocrinology) Wylene Simmer, MD as Consulting Physician (Orthopedic Surgery) Michael Boston, MD as Consulting Physician (General Surgery)  Discharge Diagnoses:  Principal Problem:   Multinodular goiter s/p total thyroidectomy 02/07/2017 Active Problems:   Mass of thyroid region   History of hiatal hernia   1 Day Post-Op  02/07/2017  POST-OPERATIVE DIAGNOSIS:   LEFT THYROID NODULE, [name]. RIGHT THYROID NODULE   SURGERY:  02/07/2017  Procedure(s): TOTAL THYROIDECTOMY  SURGEON:    Surgeon(s): Michael Boston, MD Jackolyn Confer, MD  Consults: None  Hospital Course:   The patient underwent the surgery above.  Postoperatively, the patient gradually mobilized and advanced to a solid diet.  Pain and other symptoms were treated aggressively.    By the time of discharge, the patient was walking well the hallways, eating food, having flatus.  Breathing well.  Mild hoarseness.   No stridor.  No dysphasia.  Pain was well-controlled on an oral medications.  Based on meeting discharge criteria and continuing to recover, I felt it was safe for the patient to be discharged from the hospital to further recover with close followup. Postoperative recommendations were discussed in detail.  They are written as well.  Discharged Condition: good  Disposition:   01-Home or Self Care  Discharge Instructions    Call MD for:   Complete by:  As directed    FEVER > 101.5 F  (temperatures < 101.5 F are not significant)   Call MD for:  extreme fatigue   Complete by:  As directed    Call MD for:  persistant dizziness or light-headedness   Complete by:  As directed    Call MD for:  persistant nausea and vomiting    Complete by:  As directed    Call MD for:  redness, tenderness, or signs of infection (pain, swelling, redness, odor or green/yellow discharge around incision site)   Complete by:  As directed    Call MD for:  severe uncontrolled pain   Complete by:  As directed    Diet - low sodium heart healthy   Complete by:  As directed    Follow a light diet the first few days at home.   Start with a bland diet such as soups, liquids, starchy foods, low fat foods, etc.   If you feel full, bloated, or constipated, stay on a full liquid or pureed/blenderized diet for a few days until you feel better and no longer constipated. Be sure to drink plenty of fluids every day to avoid getting dehydrated (feeling dizzy, not urinating, etc.). Gradually add a fiber supplement to your diet   Discharge instructions   Complete by:  As directed    See Discharge Instructions If you are not getting better after two weeks or are noticing you are getting worse, contact our office [phone] for further advice.  We may need to adjust your medications, re-evaluate you in the office, send you to the emergency room, or see what other things we can do to help. The clinic staff is available to answer your questions during regular business hours (8:30am-5pm).  Please don't hesitate to call and ask to speak to one of our nurses for clinical concerns.    A surgeon from [facility] is  always on call at the hospitals 24 hours/day If you have a medical emergency, go to the nearest emergency room or call 911.   Driving Restrictions   Complete by:  As directed    You may drive when you are no longer taking narcotic prescription pain medication, you can comfortably wear a seatbelt, and you can safely make sudden turns/stops to protect yourself without hesitating due to pain.   Increase activity slowly   Complete by:  As directed    Start light daily activities --- self-care, walking, climbing stairs- beginning the day  after surgery.  Gradually increase activities as tolerated.  Control your pain to be active.  Stop when you are tired.  Ideally, walk several times a day, eventually an hour a day.   Most people are back to most day-to-day activities in a few weeks.  It takes 4-8 weeks to get back to unrestricted, intense activity. If you can walk 30 minutes without difficulty, it is safe to try more intense activity such as jogging, treadmill, bicycling, low-impact aerobics, swimming, etc. Save the most intensive and strenuous activity for last (Usually 4-8 weeks after surgery) such as sit-ups, heavy lifting, contact sports, etc.  Refrain from any intense heavy lifting or straining until you are off narcotics for pain control.  You will have off days, but things should improve week-by-week. DO NOT PUSH THROUGH PAIN.  Let pain be your guide: If it hurts to do something, don't do it.  Pain is your body warning you to avoid that activity for another week until the pain goes down.   Lifting restrictions   Complete by:  As directed    If you can walk 30 minutes without difficulty, it is safe to try more intense activity such as jogging, treadmill, bicycling, low-impact aerobics, swimming, etc. Save the most intensive and strenuous activity for last (Usually 4-8 weeks after surgery) such as sit-ups, heavy lifting, contact sports, etc.  Refrain from any intense heavy lifting or straining until you are off narcotics for pain control.  You will have off days, but things should improve week-by-week. DO NOT PUSH THROUGH PAIN.  Let pain be your guide: If it hurts to do something, don't do it.  Pain is your body warning you to avoid that activity for another week until the pain goes down.   May walk up steps   Complete by:  As directed    No wound care   Complete by:  As directed    It is good for closed incision and even open wounds to be washed every day.  Shower every day.  Short baths are fine.  Wash the incisions and wounds  clean with soap & water.    If you have a closed incision(s), wash the incision with soap & water every day.  You may leave closed incisions open to air if it is dry.   You may cover the incision with clean gauze & replace it after your daily shower for comfort. If you have skin tapes (Steristrips) or skin glue (Dermabond) on your incision, leave them in place.  They will fall off on their own like a scab.  You may trim any edges that curl up with clean scissors.  If you have staples, set up an appointment for them to be removed in the office in 10 days after surgery.  If you have a drain, wash around the skin exit site with soap & water and place a new dressing of gauze  or band aid around the skin every day.  Keep the drain site clean & dry.   Sexual Activity Restrictions   Complete by:  As directed    You may have sexual intercourse when it is comfortable. If it hurts to do something, stop.      Allergies as of 02/08/2017      Reactions   Sulfur Anaphylaxis      Medication List    TAKE these medications   acetaminophen 500 MG tablet Commonly known as:  TYLENOL Take 500 mg by mouth every 6 (six) hours as needed for mild pain or moderate pain.   aspirin EC 81 MG tablet Take 81 mg by mouth daily as needed for mild pain or moderate pain.   aspirin-acetaminophen-caffeine 250-250-65 MG tablet Commonly known as:  EXCEDRIN MIGRAINE Take 1 tablet by mouth every 6 (six) hours as needed for headache.   B-COMPLEX-C PO Take 1 tablet by mouth daily.   levothyroxine 50 MCG tablet Commonly known as:  SYNTHROID, LEVOTHROID Take 1 tablet (50 mcg total) by mouth daily.   LORazepam 0.5 MG tablet Commonly known as:  ATIVAN Take 1 tablet (0.5 mg total) by mouth daily as needed (migrane). What changed:  reasons to take this   multivitamin with minerals Tabs tablet Take 1 tablet by mouth daily.   OMEGA 3 PO Take 1 capsule by mouth daily.   SUMAtriptan 50 MG tablet Commonly known as:   IMITREX Take 1 tablet (50 mg total) by mouth every 2 (two) hours as needed for migraine (max 100 mg per 24 hours). May repeat in 2 hours if headache persists or recurs.   traMADol 50 MG tablet Commonly known as:  ULTRAM Take 1-2 tablets (50-100 mg total) every 6 (six) hours as needed by mouth for moderate pain or severe pain.   TURMERIC PO Take 1 tablet by mouth daily.       Significant Diagnostic Studies:  Results for orders placed or performed during the hospital encounter of 02/07/17 (from the past 72 hour(s))  Calcium     Status: None   Collection Time: 02/08/17  5:29 AM  Result Value Ref Range   Calcium 9.0 8.9 - 10.3 mg/dL  Phosphorus     Status: None   Collection Time: 02/08/17  5:29 AM  Result Value Ref Range   Phosphorus 2.7 2.5 - 4.6 mg/dL  Magnesium     Status: None   Collection Time: 02/08/17  5:29 AM  Result Value Ref Range   Magnesium 2.1 1.7 - 2.4 mg/dL    No results found.  Discharge Exam: Blood pressure (!) 111/57, pulse 69, temperature 97.9 F (36.6 C), temperature source Oral, resp. rate 16, height 5' 4.5" (1.638 m), weight 84.4 kg (186 lb), SpO2 98 %.  General: Pt awake/alert/oriented x4 in No acute distress Eyes: PERRL, normal EOM.  Sclera clear.  No icterus Neuro: CN II-XII intact w/o focal sensory/motor deficits. Lymph: No head/neck/groin lymphadenopathy Psych:  No delerium/psychosis/paranoia HENT: Normocephalic, Mucus membranes moist.  No thrush Neck:  Dressing removed.  Normal healing ridge.  Mild hoarseness.  No stridor.  No difficulty breathing.  Supple, No tracheal deviation Chest:   No chest wall pain w good excursion CV:  Pulses intact.  Regular rhythm MS: Normal AROM mjr joints.  No obvious deformity Abdomen: Soft.  Nondistended.  Nontender.  No evidence of peritonitis.  No incarcerated hernias. Ext:  SCDs BLE.  No mjr edema.  No cyanosis Skin: No petechiae / purpura  Past Medical History:  Diagnosis Date  . Arthritis    foot  .  Headache    Migraines  . History of hiatal hernia    slight noted on imaging  . Hyperthyroidism   . Pneumonia    history of    Past Surgical History:  Procedure Laterality Date  . CHOLECYSTECTOMY  2005  . COLONOSCOPY N/A 04/28/2015   Procedure: COLONOSCOPY;  Surgeon: Rogene Houston, MD;  Location: [facility];  Service: Endoscopy;  Laterality: N/A;  730  . right ankle surgery    . THYROIDECTOMY Left 02/07/2017   Procedure: TOTAL THYROIDECTOMY;  Surgeon: Michael Boston, MD;  Location: [facility];  Service: General;  Laterality: Left;  GENERAL AND LOCAL     Social History   Socioeconomic History  . Marital status: Married    Spouse name: Not on file  . Number of children: Not on file  . Years of education: Not on file  . Highest education level: Not on file  Social Needs  . Financial resource strain: Not on file  . Food insecurity - worry: Not on file  . Food insecurity - inability: Not on file  . Transportation needs - medical: Not on file  . Transportation needs - non-medical: Not on file  Occupational History  . Not on file  Tobacco Use  . Smoking status: Never Smoker  . Smokeless tobacco: Never Used  Substance and Sexual Activity  . Alcohol use: Yes    Comment: rare  . Drug use: No  . Sexual activity: Not on file  Other Topics Concern  . Not on file  Social History Narrative  . Not on file    Family History  Problem Relation Age of Onset  . Colon cancer Brother   . Cancer Brother   . Lung disease Sister   . Hypothyroidism Sister   . Cancer Brother     Current Facility-Administered Medications  Medication Dose Route Frequency Provider Last Rate Last Dose  . 0.9 %  sodium chloride infusion   Intravenous Continuous Michael Boston, MD 50 mL/hr at 02/07/17 1326    . 0.9 %  sodium chloride infusion  250 mL Intravenous PRN Michael Boston, MD      . acetaminophen (TYLENOL) tablet 1,000 mg  1,000 mg Oral Tor Netters, MD   1,000 mg at 02/08/17 0258  . alum & mag  hydroxide-simeth (MAALOX/MYLANTA) 200-200-20 MG/5ML suspension 30 mL  30 mL Oral Q6H PRN Michael Boston, MD      . aspirin-acetaminophen-caffeine (EXCEDRIN MIGRAINE) per tablet 1 tablet  1 tablet Oral Q12H PRN Michael Boston, MD      . bisacodyl (DULCOLAX) suppository 10 mg  10 mg Rectal Daily PRN Michael Boston, MD      . diphenhydrAMINE (BENADRYL) 12.5 MG/5ML elixir 12.5 mg  12.5 mg Oral Q6H PRN Michael Boston, MD       Or  . diphenhydrAMINE (BENADRYL) injection 12.5 mg  12.5 mg Intravenous Q6H PRN Michael Boston, MD      . enoxaparin (LOVENOX) injection 40 mg  40 mg Subcutaneous Q24H Michael Boston, MD      . gabapentin (NEURONTIN) capsule 300 mg  300 mg Oral Ardeen Fillers, MD   300 mg at 02/07/17 2120  . guaiFENesin-dextromethorphan (ROBITUSSIN DM) 100-10 MG/5ML syrup 10 mL  10 mL Oral Q4H PRN Michael Boston, MD      . hydrALAZINE (APRESOLINE) injection 5-20 mg  5-20 mg Intravenous Q4H PRN Michael Boston, MD      .  hydrocortisone (ANUSOL-HC) 2.5 % rectal cream 1 application  1 application Topical QID PRN Michael Boston, MD      . hydrocortisone cream 1 % 1 application  1 application Topical TID PRN Michael Boston, MD      . HYDROmorphone (DILAUDID) injection 0.5-2 mg  0.5-2 mg Intravenous Q2H PRN Michael Boston, MD      . lactated ringers infusion 1,000 mL  1,000 mL Intravenous Q8H PRN Michael Boston, MD      . levothyroxine (SYNTHROID, LEVOTHROID) tablet 50 mcg  50 mcg Oral QAC breakfast Michael Boston, MD      . lip balm (CARMEX) ointment 1 application  1 application Topical BID Michael Boston, MD      . LORazepam (ATIVAN) tablet 0.5 mg  0.5 mg Oral Daily PRN Michael Boston, MD      . magic mouthwash  15 mL Oral QID PRN Michael Boston, MD      . menthol-cetylpyridinium (CEPACOL) lozenge 3 mg  1 lozenge Oral PRN Michael Boston, MD      . methocarbamol (ROBAXIN) tablet 500 mg  500 mg Oral Q6H PRN Michael Boston, MD   500 mg at 02/08/17 0226  . metoCLOPramide (REGLAN) injection 5-10 mg  5-10 mg Intravenous  Q6H PRN Michael Boston, MD      . multivitamin with minerals tablet 1 tablet  1 tablet Oral Daily Michael Boston, MD      . ondansetron (ZOFRAN-ODT) disintegrating tablet 4 mg  4 mg Oral Q6H PRN Michael Boston, MD       Or  . ondansetron (ZOFRAN) injection 4 mg  4 mg Intravenous Q6H PRN Michael Boston, MD      . phenol (CHLORASEPTIC) mouth spray 1-2 spray  1-2 spray Mouth/Throat PRN Michael Boston, MD      . polyethylene glycol (MIRALAX / GLYCOLAX) packet 17 g  17 g Oral Daily PRN Michael Boston, MD      . simethicone (MYLICON) chewable tablet 40 mg  40 mg Oral Q6H PRN Michael Boston, MD      . sodium chloride flush (NS) 0.9 % injection 3 mL  3 mL Intravenous Gorden Harms, MD      . sodium chloride flush (NS) 0.9 % injection 3 mL  3 mL Intravenous PRN Michael Boston, MD      . SUMAtriptan (IMITREX) tablet 50 mg  50 mg Oral Q2H PRN Michael Boston, MD      . traMADol Veatrice Bourbon) tablet 50-100 mg  50-100 mg Oral Q6H PRN Michael Boston, MD   50 mg at 02/08/17 0226  . zolpidem (AMBIEN) tablet 5 mg  5 mg Oral QHS PRN Michael Boston, MD         Allergies  Allergen Reactions  . Sulfur Anaphylaxis    Signed:     Adin Hector, M.D., F.A.C.S. [facility] [address], [place], [place] [address] [phone] Main / Paging   02/08/2017, 7:40 AM

## 2017-02-08 NOTE — Plan of Care (Signed)
Plan of care reviewed with the patient.  Denies questions at this time.

## 2017-02-18 NOTE — Progress Notes (Signed)
Patient status post total thyroidectomy for thyroid nodules.  Operative findings noted.  I strongly recommend she continue taking her Synthroid/levothyroxine.  Would benefit from seeing her endocrinologist to see if that needs to be adjusted or if she would benefit from radioactive iodine.  They seem early and small.  Patient still with some moderate hoarseness, but slowly improving. She is eating and breathing better. Keep appointment to see me next week.  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 - HASHIMOTO'S THYROIDITIS. - pT1a, pNX     Microscopic Comment THYROID Specimen: Thyroid gland Procedure (including lymph node sampling if applicable): Total thyroidectomy Specimen Integrity (intact/fragmented): Intact Tumor focality: Two foci in the right mid and lower lobe Dominant tumor: Maximum tumor size (cm): 0.4 cm, 0.3 cm Tumor laterality: Right mid and lower lobe Histologic type (including subtype and/or unique features as applicable): Follicular variant of papillary carcinoma Tumor capsule: Unencapsulated Extrathyroidal extension: No Capsular invasion with degree of invasion if present: Not applicable Margins: Negative Lymphatic or vascular invasion: No Lymph nodes: # examined: Not applicable; # positive: Not applicable Extracapsular extension (if applicable): Not applicable TNM code: VC9S, pNX Non-neoplastic thyroid: Hashimoto thyroiditis Comments: Multiple sections from both lobes primarily show nodular hyperplasia including abundant colloid material, Hurthle cell changes and lymphocytic infiltration with germinal center formation most consistent with Hashimoto's thyroiditis. In this background, there are two small microscopic foci of follicular variant of papillary carcinoma as seen in the right lobe (blocks M and L), which are limited to thyroid gland. Clinical correlation is  recommended. (BNS:ecj 02/11/2017) 1 of 2 FINAL for Amber Rivas, Amber Rivas [id number]) Microscopic Comment(continued) Amber Greenhouse MD Pathologist, Electronic Signature (Case signed 02/11/2017) Specimen Amber Rivas and Clinical Information Specimen(s) Obtained: Thyroid, thyroidectomy Specimen Clinical Information left thyroid nodule, Amber Rivas 3. right thyroid nodule (kp) Amber Rivas Specimen: Total thyroidectomy. Specimen integrity (intact/incised/disrupted): Intact, there is a stitch designating the left superior lobe. Size and weight: Specimen is 66 grams and consists of a 5.2 x 3.6 x 1 cm right lobe a 2.5 x 2 x 0.5 cm isthmus and an 8.1 x 4.6 x 3.1 cm left lobe. External surface: Smooth, intact, with a 1.2 cm possible loosely adherent possible lymph node at the superior isthmus and left lobe. The anterior surface is black and the posterior surface inked yellow. Lesion: The entire left lobe is replaced by a soft pink-red vaguely lobulated mass spanning 7.5 cm from superior to inferior, 4 cm from medial to lateral, and 3.1 cm from anterior to posterior. The lesion is extremely indistinct and unencapsulated. Normal adjacent possible parenchyma is not grossly seen. The lesion grossly abuts with the anterior and posterior surface and is 1.2 cm from the isthmus. The right lobe resection reveals red-brown firm slightly lobulated parenchyma, with several pale white, ill defined areas spanning from 0.2 to 0.5 cm throughout the mid and lower right lobe. The areas are submitted entirely. Uninvolved parenchyma: A portion of the right lobe displays firm red brown slightly lobulated possible normal parenchyma. Block Summary: 1A= one possible lymph node bisected. 1B-1I= representative left lobe lesion submitted from superior to inferior. 1J-1G= representative right mid and lower lobe with p

## 2017-02-21 ENCOUNTER — Telehealth: Payer: Self-pay | Admitting: Pediatrics

## 2017-02-21 NOTE — Telephone Encounter (Signed)
lmtcb

## 2017-02-21 NOTE — Telephone Encounter (Signed)
Nothing I can send in without being seen, does she htinks eyes were irritated from the surgery? Are allergies any worse? Can we get her in to seen with Dr Rona Ravens or [facility] in [place]? They can probably see her today

## 2017-02-21 NOTE — Telephone Encounter (Signed)
Please advise 

## 2017-03-13 ENCOUNTER — Other Ambulatory Visit (INDEPENDENT_AMBULATORY_CARE_PROVIDER_SITE_OTHER): Payer: Medicare Other

## 2017-03-13 ENCOUNTER — Ambulatory Visit: Payer: Medicare Other | Admitting: Endocrinology

## 2017-03-13 ENCOUNTER — Encounter: Payer: Self-pay | Admitting: Endocrinology

## 2017-03-13 DIAGNOSIS — E039 Hypothyroidism, unspecified: Secondary | ICD-10-CM | POA: Diagnosis not present

## 2017-03-13 DIAGNOSIS — E559 Vitamin D deficiency, unspecified: Secondary | ICD-10-CM | POA: Diagnosis not present

## 2017-03-13 LAB — TSH: TSH: 35.72 u[IU]/mL — AB (ref 0.35–4.50)

## 2017-03-13 LAB — VITAMIN D 25 HYDROXY (VIT D DEFICIENCY, FRACTURES): VITD: 43.18 ng/mL (ref 30.00–100.00)

## 2017-03-13 MED ORDER — LEVOTHYROXINE SODIUM 125 MCG PO TABS: 125.0000 ug | ORAL_TABLET | Freq: Every day | ORAL | 3 refills | Status: DC

## 2017-03-13 NOTE — Progress Notes (Signed)
Subjective:    Patient ID: Amber Rivas, female    DOB: [birth year], 71 y.o.   MRN: [id number]  HPI  The state of at least three ongoing medical problems is addressed today, with interval history of each noted here:  Pt returns for f/u of multinodular thyroid (dx'ed 2018, incidentally on a CT, she noted to have a left thyroid nodule; she had had thyroidectomy last month). Neck pain is decreasing.  This is a stable problem. Hypothyroidism was dx'ed in 1977.  he takes synthroid 50/d.  She has fatigue.  This is a stable problem. vit-D deficiency: she denies cramps.  She takes vit-D, 4000 units/day.  This is a stable problem. Past Medical History:  Diagnosis Date  . Arthritis    foot  . Headache    Migraines  . History of hiatal hernia    slight noted on imaging  . Hyperthyroidism   . Pneumonia    history of    Past Surgical History:  Procedure Laterality Date  . CHOLECYSTECTOMY  2005  . COLONOSCOPY N/A 04/28/2015   Procedure: COLONOSCOPY;  Surgeon: Rogene Houston, MD;  Location: [facility];  Service: Endoscopy;  Laterality: N/A;  730  . right ankle surgery    . THYROIDECTOMY Left 02/07/2017   Procedure: TOTAL THYROIDECTOMY;  Surgeon: Michael Boston, MD;  Location: [facility];  Service: General;  Laterality: Left;  GENERAL AND LOCAL     Social History   Socioeconomic History  . Marital status: Married    Spouse name: Not on file  . Number of children: Not on file  . Years of education: Not on file  . Highest education level: Not on file  Social Needs  . Financial resource strain: Not on file  . Food insecurity - worry: Not on file  . Food insecurity - inability: Not on file  . Transportation needs - medical: Not on file  . Transportation needs - non-medical: Not on file  Occupational History  . Not on file  Tobacco Use  . Smoking status: Never Smoker  . Smokeless tobacco: Never Used  Substance and Sexual Activity  . Alcohol use: Yes    Comment: rare  . Drug use: No    . Sexual activity: Not on file  Other Topics Concern  . Not on file  Social History Narrative  . Not on file    Current Outpatient Medications on File Prior to Visit  Medication Sig Dispense Refill  . acetaminophen (TYLENOL) 500 MG tablet Take 500 mg by mouth every 6 (six) hours as needed for mild pain or moderate pain.    [name] aspirin EC 81 MG tablet Take 81 mg by mouth daily as needed for mild pain or moderate pain.     [name] aspirin-acetaminophen-caffeine (EXCEDRIN MIGRAINE) 250-250-65 MG tablet Take 1 tablet by mouth every 6 (six) hours as needed for headache.     . B-COMPLEX-C PO Take 1 tablet by mouth daily.    [name] LORazepam (ATIVAN) 0.5 MG tablet Take 1 tablet (0.5 mg total) by mouth daily as needed (migrane). (Patient taking differently: Take 0.5 mg by mouth daily as needed (for migraine). ) 15 tablet 1  . Multiple Vitamin (MULTIVITAMIN WITH MINERALS) TABS tablet Take 1 tablet by mouth daily.    . Omega-3 Fatty Acids (OMEGA 3 PO) Take 1 capsule by mouth daily.     . SUMAtriptan (IMITREX) 50 MG tablet Take 1 tablet (50 mg total) by mouth every 2 (two) hours as needed  for migraine (max 100 mg per 24 hours). May repeat in 2 hours if headache persists or recurs. 14 tablet 3  . traMADol (ULTRAM) 50 MG tablet Take 1-2 tablets (50-100 mg total) every 6 (six) hours as needed by mouth for moderate pain or severe pain. 30 tablet 0  . TURMERIC PO Take 1 tablet by mouth daily.     No current facility-administered medications on file prior to visit.     Allergies  Allergen Reactions  . Sulfur Anaphylaxis    Family History  Problem Relation Age of Onset  . Colon cancer Brother   . Cancer Brother   . Lung disease Sister   . Hypothyroidism Sister   . Cancer Brother     BP (!) 142/88 (BP Location: Left Arm, Patient Position: Sitting, Cuff Size: Normal)   Pulse 75   Wt 186 lb 6.4 oz (84.6 kg)   SpO2 98%   BMI 31.50 kg/m   Review of Systems Denies numbness.  She still has some neck  swelling.      Objective:   Physical Exam VITAL SIGNS:  See vs page GENERAL: no distress Neck: a healing scar is present.  I do not appreciate a nodule in the thyroid or elsewhere in the neck.   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 - HASHIMOTO'S THYROIDITIS. - pT1a, pNX      Assessment & Plan:  differentiated thyroid cancer: she does not need adjuvant RAI Hypothyroidism: due for recheck vit-D deficiency: due for recheck  Patient Instructions  blood tests are requested for you today.  We'll let you know about the results. Dr Evette Doffing would be happy to examine your thyroid each year, as part of your annual checkup. The hoarseness almost always goes away with time.  I would be happy to see you back here as needed.

## 2017-03-13 NOTE — Telephone Encounter (Signed)
Attempted to call pt over 2 weeks ago without a return call, will close encounter.

## 2017-03-13 NOTE — Patient Instructions (Addendum)
blood tests are requested for you today.  We'll let you know about the results. Dr Evette Doffing would be happy to examine your thyroid each year, as part of your annual checkup. The hoarseness almost always goes away with time.  I would be happy to see you back here as needed.

## 2017-03-21 ENCOUNTER — Telehealth: Payer: Self-pay

## 2017-03-21 NOTE — Telephone Encounter (Signed)
No problem with another surgery from the thyroid standpoint.  I am unaware of anything else that can be done for the hoarseness.

## 2017-03-21 NOTE — Telephone Encounter (Signed)
I called patient & advised on what was said. She stated that she had been taking the increased dose of levothyroxine & is hoping that will help her to also feel better.

## 2017-03-21 NOTE — Telephone Encounter (Signed)
Patient called today and stated she is still having trouble with her throat after 6 weeks since her surgery she is very hoarse and she gets very tired easily from trying to talk she wants to know if there is a rinse or anything that can be done to help her- please advise she is also going to be having surgery to remove screws in her ankle and she wants to know if it is safe to be put to sleep this soon or will there be any adverse effects from having another surgery so soon please advise

## 2017-03-29 ENCOUNTER — Other Ambulatory Visit: Payer: Self-pay | Admitting: Pediatrics

## 2017-03-29 DIAGNOSIS — F419 Anxiety disorder, unspecified: Secondary | ICD-10-CM

## 2017-03-29 NOTE — Telephone Encounter (Signed)
Patient last seen in office on 10/22. Rx last prescribed on 10/22 for #15 with 1 RF. Please advise

## 2017-04-08 ENCOUNTER — Telehealth: Payer: Self-pay | Admitting: Endocrinology

## 2017-04-08 NOTE — Telephone Encounter (Signed)
I printed letter.

## 2017-04-08 NOTE — Telephone Encounter (Signed)
Patient called back & asked if we could put in labs for [facility]? She will call back with correct fax number so I can fax them the requisition.

## 2017-04-08 NOTE — Telephone Encounter (Signed)
Pt stated she needing another tyroid test done now that she has been taking new medication.   She wants to know if she could have it done in [place]. At her dr there   Please advise

## 2017-04-08 NOTE — Telephone Encounter (Signed)
Pt is returning phone call with the fax number for the place she is wanting labs done at   FAX# - [phone]

## 2017-04-08 NOTE — Telephone Encounter (Signed)
I can order to be done at a commercial lab or hospital.  However, if it is at another Dr office, they would need to order.  Please let us know.

## 2017-04-08 NOTE — Telephone Encounter (Signed)
I called patient & she stated that she would call her PCP to see if they would do labs so she doesn't have to make the long drive here.

## 2017-04-09 ENCOUNTER — Other Ambulatory Visit: Payer: Self-pay | Admitting: Endocrinology

## 2017-04-09 ENCOUNTER — Other Ambulatory Visit: Payer: Medicare Other

## 2017-04-09 DIAGNOSIS — E039 Hypothyroidism, unspecified: Secondary | ICD-10-CM

## 2017-04-09 DIAGNOSIS — E559 Vitamin D deficiency, unspecified: Secondary | ICD-10-CM

## 2017-04-10 ENCOUNTER — Other Ambulatory Visit: Payer: Self-pay | Admitting: Endocrinology

## 2017-04-10 LAB — TSH: TSH: 0.322 u[IU]/mL — ABNORMAL LOW (ref 0.450–4.500)

## 2017-04-10 LAB — VITAMIN D 25 HYDROXY (VIT D DEFICIENCY, FRACTURES): VIT D 25 HYDROXY: 49.6 ng/mL (ref 30.0–100.0)

## 2017-04-10 MED ORDER — LEVOTHYROXINE SODIUM 112 MCG PO TABS: 112.0000 ug | ORAL_TABLET | Freq: Every day | ORAL | 3 refills | Status: DC

## 2017-04-11 ENCOUNTER — Other Ambulatory Visit: Payer: Self-pay

## 2017-04-29 ENCOUNTER — Ambulatory Visit: Payer: Medicare Other | Admitting: *Deleted

## 2017-04-30 ENCOUNTER — Encounter: Payer: Self-pay | Admitting: Pediatrics

## 2017-05-13 ENCOUNTER — Encounter: Payer: Self-pay | Admitting: Family Medicine

## 2017-05-13 ENCOUNTER — Ambulatory Visit: Payer: Medicare Other | Admitting: Family Medicine

## 2017-05-13 VITALS — BP 133/89 | HR 83 | Temp 96.6°F | Ht 64.5 in | Wt 188.0 lb

## 2017-05-13 DIAGNOSIS — R300 Dysuria: Secondary | ICD-10-CM

## 2017-05-13 LAB — MICROSCOPIC EXAMINATION
RBC MICROSCOPIC, UA: NONE SEEN /HPF (ref 0–?)
RENAL EPITHEL UA: NONE SEEN /HPF

## 2017-05-13 LAB — URINALYSIS, COMPLETE
Bilirubin, UA: NEGATIVE
Glucose, UA: NEGATIVE
Ketones, UA: NEGATIVE
Leukocytes, UA: NEGATIVE
Nitrite, UA: NEGATIVE
PH UA: 6.5 (ref 5.0–7.5)
PROTEIN UA: NEGATIVE
RBC, UA: NEGATIVE
Specific Gravity, UA: 1.015 (ref 1.005–1.030)
UUROB: 0.2 mg/dL (ref 0.2–1.0)

## 2017-05-13 MED ORDER — CEPHALEXIN 500 MG PO CAPS: 500.0000 mg | ORAL_CAPSULE | Freq: Two times a day (BID) | ORAL | 0 refills | Status: AC

## 2017-05-13 NOTE — Progress Notes (Signed)
Subjective: CC: urinary symptoms PCP: Eustaquio Maize, MD MHD:[id number] W Foskey is a 72 y.o. female presenting to clinic today for:  1. Urinary symptoms Patient reports a 4-day history of dysuria, urinary frequency and urinary urgency.  She reports associated bladder pressure.  Denies hematuria, fevers, chills, abdominal pain, nausea, vomiting, back pain, vaginal discharge.  She notes that she actually took them at home UTI test which was positive yesterday.  She has been using AZO, cranberry juice and pushing oral fluids with some relief of urinary symptoms.  Patient denies a h/o frequent or recurrent UTIs.      ROS: Per HPI  Allergies  Allergen Reactions  . Sulfur Anaphylaxis   Past Medical History:  Diagnosis Date  . Arthritis    foot  . Headache    Migraines  . History of hiatal hernia    slight noted on imaging  . Hyperthyroidism   . Pneumonia    history of    Current Outpatient Medications:  .  acetaminophen (TYLENOL) 500 MG tablet, Take 500 mg by mouth every 6 (six) hours as needed for mild pain or moderate pain., Disp: , Rfl:  .  aspirin EC 81 MG tablet, Take 81 mg by mouth daily as needed for mild pain or moderate pain. , Disp: , Rfl:  .  aspirin-acetaminophen-caffeine (EXCEDRIN MIGRAINE) 250-250-65 MG tablet, Take 1 tablet by mouth every 6 (six) hours as needed for headache. , Disp: , Rfl:  .  B-COMPLEX-C PO, Take 1 tablet by mouth daily., Disp: , Rfl:  .  levothyroxine (SYNTHROID, LEVOTHROID) 112 MCG tablet, Take 1 tablet (112 mcg total) by mouth daily before breakfast., Disp: 90 tablet, Rfl: 3 .  LORazepam (ATIVAN) 0.5 MG tablet, TAKE ONE TABLET BY MOUTH EVERY DAY AS NEEDED FOR MIGRAINE, Disp: 15 tablet, Rfl: 0 .  Multiple Vitamin (MULTIVITAMIN WITH MINERALS) TABS tablet, Take 1 tablet by mouth daily., Disp: , Rfl:  .  Omega-3 Fatty Acids (OMEGA 3 PO), Take 1 capsule by mouth daily. , Disp: , Rfl:  .  SUMAtriptan (IMITREX) 50 MG tablet, Take 1 tablet (50 mg total)  by mouth every 2 (two) hours as needed for migraine (max 100 mg per 24 hours). May repeat in 2 hours if headache persists or recurs., Disp: 14 tablet, Rfl: 3 .  traMADol (ULTRAM) 50 MG tablet, Take 1-2 tablets (50-100 mg total) every 6 (six) hours as needed by mouth for moderate pain or severe pain., Disp: 30 tablet, Rfl: 0 .  TURMERIC PO, Take 1 tablet by mouth daily., Disp: , Rfl:  Social History   Socioeconomic History  . Marital status: Married    Spouse name: Not on file  . Number of children: Not on file  . Years of education: Not on file  . Highest education level: Not on file  Social Needs  . Financial resource strain: Not on file  . Food insecurity - worry: Not on file  . Food insecurity - inability: Not on file  . Transportation needs - medical: Not on file  . Transportation needs - non-medical: Not on file  Occupational History  . Not on file  Tobacco Use  . Smoking status: Never Smoker  . Smokeless tobacco: Never Used  Substance and Sexual Activity  . Alcohol use: Yes    Comment: rare  . Drug use: No  . Sexual activity: Not on file  Other Topics Concern  . Not on file  Social History Narrative  . Not on file  Family History  Problem Relation Age of Onset  . Colon cancer Brother   . Cancer Brother   . Lung disease Sister   . Hypothyroidism Sister   . Cancer Brother     Objective: Office vital signs reviewed. BP 133/89   Pulse 83   Temp (!) 96.6 F (35.9 C) (Oral)   Ht 5' 4.5" (1.638 m)   Wt 188 lb (85.3 kg)   BMI 31.77 kg/m   Physical Examination:  General: Awake, alert, well nourished, nontoxic, No acute distress GU: Suprapubic tenderness to palpation present.  No CVA tenderness to palpation.  Assessment/ Plan: 72 y.o. female   1. Dysuria Patient is afebrile and nontoxic-appearing.  Her urinary symptoms are very consistent with a urinary tract infection.  Her urinalysis demonstrated few bacteria.  No significant leukocytes or nitrites.  No  blood noted.  Will send for urine culture.  Possible diluted effects secondary to increased p.o. intake.  Will start Keflex 500 mg p.o. twice daily for the next 7 days.  Will contact patient with urine culture once available.  Care injections reviewed with patient.  Reasons for emergent evaluation reviewed with patient.  She voiced good understanding will follow-up as needed. - Urinalysis, Complete - Urine Culture   Orders Placed This Encounter  Procedures  . Urine Culture  . Microscopic Examination  . Urinalysis, Complete   Meds ordered this encounter  Medications  . cephALEXin (KEFLEX) 500 MG capsule    Sig: Take 1 capsule (500 mg total) by mouth 2 (two) times daily for 7 days.    Dispense:  14 capsule    Refill:  [place], DO [place] [phone]

## 2017-05-13 NOTE — Patient Instructions (Signed)
Your urine did show a few bacteria.  I have sent this for urine culture.  I have ordered Keflex for you to take twice a day for the next 7 days.  You may continue the AZO for the next 2 days but after that I would like you to discontinue so has not to mask any symptoms.  I will contact you once the urine culture is available.   Urinary Tract Infection, Adult A urinary tract infection (UTI) is an infection of any part of the urinary tract. The urinary tract includes the:  Kidneys.  Ureters.  Bladder.  Urethra.  These organs make, store, and get rid of pee (urine) in the body. Follow these instructions at home:  Take over-the-counter and prescription medicines only as told by your doctor.  If you were prescribed an antibiotic medicine, take it as told by your doctor. Do not stop taking the antibiotic even if you start to feel better.  Avoid the following drinks: ? Alcohol. ? Caffeine. ? Tea. ? Carbonated drinks.  Drink enough fluid to keep your pee clear or pale yellow.  Keep all follow-up visits as told by your doctor. This is important.  Make sure to: ? Empty your bladder often and completely. Do not to hold pee for long periods of time. ? Empty your bladder before and after sex. ? Wipe from front to back after a bowel movement if you are female. Use each tissue one time when you wipe. Contact a doctor if:  You have back pain.  You have a fever.  You feel sick to your stomach (nauseous).  You throw up (vomit).  Your symptoms do not get better after 3 days.  Your symptoms go away and then come back. Get help right away if:  You have very bad back pain.  You have very bad lower belly (abdominal) pain.  You are throwing up and cannot keep down any medicines or water. This information is not intended to replace advice given to you by your health care provider. Make sure you discuss any questions you have with your health care provider. Document Released: 08/29/2007  Document Revised: 08/18/2015 Document Reviewed: 01/31/2015 Elsevier Interactive Patient Education  [employer].

## 2017-05-20 ENCOUNTER — Ambulatory Visit: Payer: Medicare Other | Admitting: Pediatrics

## 2017-05-20 ENCOUNTER — Encounter: Payer: Self-pay | Admitting: Pediatrics

## 2017-05-20 ENCOUNTER — Telehealth: Payer: Self-pay

## 2017-05-20 ENCOUNTER — Encounter (HOSPITAL_COMMUNITY): Payer: Self-pay | Admitting: Psychiatry

## 2017-05-20 VITALS — BP 137/87 | HR 89 | Temp 97.1°F | Ht 64.5 in | Wt 183.0 lb

## 2017-05-20 DIAGNOSIS — F419 Anxiety disorder, unspecified: Secondary | ICD-10-CM

## 2017-05-20 DIAGNOSIS — F339 Major depressive disorder, recurrent, unspecified: Secondary | ICD-10-CM

## 2017-05-20 MED ORDER — ESCITALOPRAM OXALATE 10 MG PO TABS: ORAL_TABLET | ORAL | 5 refills | Status: DC

## 2017-05-20 NOTE — Progress Notes (Signed)
[place] Telephone Follow-up  MRN: [id number] NAME: Amber Rivas Date: 05/20/17 Time of Assessment: 3:07 PM Call number: 1/6  Reason for call today: Reason for Contact: Initial Assessment  PHQ-9 Scores:  Depression screen [facility] 2/9 05/20/2017 05/20/2017 05/13/2017 01/14/2017 11/12/2016  Decreased Interest 3 1 0 0 0  Down, Depressed, Hopeless 3 2 0 0 0  PHQ - 2 Score 6 3 0 0 0  Altered sleeping 2 2 - - -  Tired, decreased energy 1 1 - - -  Change in appetite 0 0 - - -  Feeling bad or failure about yourself  3 1 - - -  Trouble concentrating 0 3 - - -  Moving slowly or fidgety/restless 0 1 - - -  Suicidal thoughts 0 0 - - -  PHQ-9 Score 12 11 - - -  Difficult doing work/chores - Somewhat difficult - - -    GAD 7 : Generalized Anxiety Score 05/20/2017 05/20/2017  Nervous, Anxious, on Edge 2 3  Control/stop worrying 3 2  Worry too much - different things 3 2  Trouble relaxing 2 1  Restless 2 0  Easily annoyed or irritable 0 2  Afraid - awful might happen 0 2  Total GAD 7 Score 12 12  Anxiety Difficulty - Very difficult     Stress Current stressors: Current Stressors: Family conflict(Strained relationship with son; All four of her siblings have life threatenign illness) Sleep: Sleep: Difficulty staying asleep(The medication that she is now taking is helping her.) Appetite: Appetite: No problems Coping ability: Coping ability: Overwhelmed Patient taking medications as prescribed: Patient taking medications as prescribed: Yes  Current medications:  Outpatient Encounter Medications as of 05/20/2017  Medication Sig  . acetaminophen (TYLENOL) 500 MG tablet Take 500 mg by mouth every 6 (six) hours as needed for mild pain or moderate pain.  [name] aspirin EC 81 MG tablet Take 81 mg by mouth daily as needed for mild pain or moderate pain.   [name] aspirin-acetaminophen-caffeine (EXCEDRIN MIGRAINE) 250-250-65 MG tablet Take 1 tablet by mouth every 6 (six) hours as needed for headache.    . B-COMPLEX-C PO Take 1 tablet by mouth daily.  . cephALEXin (KEFLEX) 500 MG capsule Take 1 capsule (500 mg total) by mouth 2 (two) times daily for 7 days.  [name] escitalopram (LEXAPRO) 10 MG tablet Take 1/2 tab daily for 2 weeks then full tab.  . levothyroxine (SYNTHROID, LEVOTHROID) 112 MCG tablet Take 1 tablet (112 mcg total) by mouth daily before breakfast.  . Multiple Vitamin (MULTIVITAMIN WITH MINERALS) TABS tablet Take 1 tablet by mouth daily.  . Omega-3 Fatty Acids (OMEGA 3 PO) Take 1 capsule by mouth daily.   . TURMERIC PO Take 1 tablet by mouth daily.   No facility-administered encounter medications on file as of 05/20/2017.      Self-harm Behaviors Risk Assessment Self-harm risk factors:   Patient endorses recent thoughts of harming self: Have you recently had any thoughts about harming yourself?: No  [place] Suicide Severity Rating Scale: No flowsheet data found. No flowsheet data found.   Danger to Others Risk Assessment Danger to others risk factors: Danger to Others Risk Factors: No risk factors noted Patient endorses recent thoughts of harming others: Notification required: No need or identified person  Dynamic Appraisal of Situational Aggression (DASA): No flowsheet data found.    Goals, Interventions and Follow-up Plan Goals: Decrease feelings of depression associated with her strained relationship with her son and her four siblings having  life threatening illness as evidenced by a reduction in the PHQ-9 and the GAD-7 .    Interventions: Motivational interviewing, brief counseling, Discussed stressors and coping skills  Provided motivational interviewing, Assessed for safety, Completed PHQ-9 and  GAD-7 screening   Follow-up Plan: [facility] Counselor provided patient patient with a therapeutic homework assignment in which she will writer out what her son says to her to make her feel depressed or anxious; Probation officer down how those statements make her feel and then writer down  what she would say to him  Summary:  Patient is a 72 year old married female that reports depression and anxiety associated with her strained relationship with her 1 year old son that has mental health issues requiring inpatient hospitalizations and her four siblings having life threatening medical illnesses.  Patient reports that her husband mopes around the house and is also causing her to deep depressed as well.   Patient reports that she feels helpless and worthless because her son blames her for being hospitalized in January 2019.  In addition, her she is next to the oldest and she feels as if she needs to be doing more to assist her younger siblings with their medical conditions.  Patient reports that she is not able to remember the name of the medication to help her to sleep and to assist her with her anxiety and depression.  Patient reports that as long as she takes the medication she is able to sleep through the night.  Patient denies prior side effects with her medication.  Patient denies issues with her appetite.   Patient denies a history of mania.  Patient denies decreased need for sleep. Patient denies euphoria.  Patient denies past suicide attempts. Patient denies any past or present self-injurious behaviors.  Patient reports a family history of mental illness in which her son is diagnosed with Bipolar Depression.  Patient denies a family history of substance abuse.  Patient denies a family history of suicide. Patient denies DUI.  Patient denies insomnia. Patient denies SI/HI.  Patient denies a history of violence.  Patient denies AH/VH/paranoia.  Patient denies physical, sexual or emotional abuse. Patient denies prior inpatient psychiatric hospitalization.  Patient reports a past history of OPT three years ago.      Graciella Freer [place], LCAS-A

## 2017-05-20 NOTE — Progress Notes (Signed)
  Subjective:   Patient ID: Amber Rivas, female    DOB: March 13, 1946, 72 y.o.   MRN: [id number] CC: Anxiety and Depression  HPI: Amber Rivas is a 72 y.o. female presenting for Anxiety and Depression  [facility].  Anxiety and depression: more symptoms. Mood has been worse. Can't sit to read book, or get into productive side or creative side.   HTN: SBP usually 120s at home.  Amber Rivas with untreated depression per pt.  Multiple siblings with illness including cancer diagnoses.   Following with endocrine after thyroid surgery. Hoarse voice much improved. Has upcoming appt.  GAD 7 : Generalized Anxiety Score 05/20/2017 05/20/2017  Nervous, Anxious, on Edge 2 3  Control/stop worrying 3 2  Worry too much - different things 3 2  Trouble relaxing 2 1  Restless 2 0  Easily annoyed or irritable 0 2  Afraid - awful might happen 0 2  Total GAD 7 Score 12 12  Anxiety Difficulty - Very difficult    Depression screen [facility] 2/9 05/20/2017 05/20/2017 05/13/2017 01/14/2017 11/12/2016  Decreased Interest 3 1 0 0 0  Down, Depressed, Hopeless 3 2 0 0 0  PHQ - 2 Score 6 3 0 0 0  Altered sleeping 2 2 - - -  Tired, decreased energy 1 1 - - -  Change in appetite 0 0 - - -  Feeling bad or failure about yourself  3 1 - - -  Trouble concentrating 0 3 - - -  Moving slowly or fidgety/restless 0 1 - - -  Suicidal thoughts 0 0 - - -  PHQ-9 Score 12 11 - - -  Difficult doing work/chores - Somewhat difficult - - -     Relevant past medical, surgical, family and social history reviewed. Allergies and medications reviewed and updated. Social History   Tobacco Use  Smoking Status Never Smoker  Smokeless Tobacco Never Used   ROS: Per HPI   Objective:    BP 137/87   Pulse 89   Temp (!) 97.1 F (36.2 C) (Oral)   Ht 5' 4.5" (1.638 m)   Wt 183 lb (83 kg)   BMI 30.93 kg/m   Wt Readings from Last 3 Encounters:  05/20/17 183 lb (83 kg)  05/13/17 188 lb (85.3 kg)  03/13/17  186 lb 6.4 oz (84.6 kg)    Gen: NAD, alert EYES: EOMI, no conjunctival injection, or no icterus ENT:  OP without erythema LYMPH: no cervical LAD CV: NRRR, normal S1/S2 Resp: CTABL, no wheezes, normal WOB Abd: +BS, soft, NTND. Ext: No edema, warm Neuro: Alert and oriented, strength equal b/l UE and LE, coordination grossly normal  Assessment & Plan:  [name] was seen today for anxiety and depression.  Diagnoses and all orders for this visit:  Anxiety Start below. Referral to [facility]. Pt very open to counseling. -     escitalopram (LEXAPRO) 10 MG tablet; Take 1/2 tab daily for 2 weeks then full tab.  Depression, recurrent ([place]) SSRi and counseling as above.   I spent 25 minutes with the patient with over 50% of the encounter time dedicated to counseling on the above problems.  Follow up plan: Return in about 4 weeks (around 06/17/2017). Assunta Found, MD [place]

## 2017-05-20 NOTE — Patient Instructions (Addendum)
Melatonin at night as needed to help with sleep  Try to get outside to walk 20-30 min a day

## 2017-05-22 NOTE — Progress Notes (Signed)
[facility] Psychiatric Consultant Case Review   Summary Amber Rivas is a 72 y.o. year old female with history of depression, anxiety,  multinodular goiter s/p total thyroidectomy 02/07/2017. Lexapro was started with uptitration on 2/25. Patient expresses stress from interaction with her son with bipolar depression. She denies any safety concern.  Functional Impairment:n/a Psychosocial factors: discordance with her son with bipolar depression, her siblings with life threatening illness  Current Medications Current Outpatient Medications on File Prior to Visit  Medication Sig Dispense Refill  . acetaminophen (TYLENOL) 500 MG tablet Take 500 mg by mouth every 6 (six) hours as needed for mild pain or moderate pain.    [name] aspirin EC 81 MG tablet Take 81 mg by mouth daily as needed for mild pain or moderate pain.     [name] aspirin-acetaminophen-caffeine (EXCEDRIN MIGRAINE) 250-250-65 MG tablet Take 1 tablet by mouth every 6 (six) hours as needed for headache.     . B-COMPLEX-C PO Take 1 tablet by mouth daily.    . cephALEXin (KEFLEX) 500 MG capsule Take 1 capsule (500 mg total) by mouth 2 (two) times daily for 7 days. 14 capsule 0  . escitalopram (LEXAPRO) 10 MG tablet Take 1/2 tab daily for 2 weeks then full tab. 30 tablet 5  . levothyroxine (SYNTHROID, LEVOTHROID) 112 MCG tablet Take 1 tablet (112 mcg total) by mouth daily before breakfast. 90 tablet 3  . Multiple Vitamin (MULTIVITAMIN WITH MINERALS) TABS tablet Take 1 tablet by mouth daily.    . Omega-3 Fatty Acids (OMEGA 3 PO) Take 1 capsule by mouth daily.     . TURMERIC PO Take 1 tablet by mouth daily.     No current facility-administered medications on file prior to visit.      Past psychiatry history Outpatient: three years ago Psychiatry admission: denies Previous suicide attempt: denies Past trials of medication: lexapro History of violence: denies  Current measures Depression screen [facility] 2/9  05/20/2017 05/20/2017 05/13/2017 01/14/2017 11/12/2016  Decreased Interest 3 1 0 0 0  Down, Depressed, Hopeless 3 2 0 0 0  PHQ - 2 Score 6 3 0 0 0  Altered sleeping 2 2 - - -  Tired, decreased energy 1 1 - - -  Change in appetite 0 0 - - -  Feeling bad or failure about yourself  3 1 - - -  Trouble concentrating 0 3 - - -  Moving slowly or fidgety/restless 0 1 - - -  Suicidal thoughts 0 0 - - -  PHQ-9 Score 12 11 - - -  Difficult doing work/chores - Somewhat difficult - - -   GAD 7 : Generalized Anxiety Score 05/20/2017 05/20/2017  Nervous, Anxious, on Edge 2 3  Control/stop worrying 3 2  Worry too much - different things 3 2  Trouble relaxing 2 1  Restless 2 0  Easily annoyed or irritable 0 2  Afraid - awful might happen 0 2  Total GAD 7 Score 12 12  Anxiety Difficulty - Very difficult    Goals (patient centered) Decrease depression, work on diary for behavioral analysis  Assessment/Provisional Diagnosis # major depressive disorder # Unspecified anxiety disorder Agree with starting lexapro with uptitration to target depression and anxiety.   Recommendation - Continue lexapro, titrate up to 10 mg daily - BH specialist to do behavioral analysis and behavioral activation.   Thank you for your consult. We will continue to follow the patient. Please contact [place]  for any questions or  concerns.   The above treatment considerations and suggestions are based on consultation with the [facility] specialist and/or PCP and a review of information available in the shared registry and the patient's [place] Record (EHR). I have not personally examined the patient. All recommendations should be implemented with consideration of the patient's relevant prior history and current clinical status. Please feel free to call me with any questions about the care of this patient.

## 2017-05-22 NOTE — Telephone Encounter (Signed)
This encounter was created in error - please disregard.

## 2017-05-25 ENCOUNTER — Other Ambulatory Visit: Payer: Self-pay | Admitting: Pediatrics

## 2017-05-25 DIAGNOSIS — F419 Anxiety disorder, unspecified: Secondary | ICD-10-CM

## 2017-05-27 NOTE — Telephone Encounter (Signed)
Patient states that she was trying to stop taking lorazepam. But had a few migraines last week with no relief with excedrin. Patient stats that she only wants enough to take when she has migraines.

## 2017-05-27 NOTE — Telephone Encounter (Signed)
Just saw pt in clinic, she said she wasn't taking it anymore, we agreed to take med off list.

## 2017-05-28 ENCOUNTER — Other Ambulatory Visit: Payer: Self-pay | Admitting: Pediatrics

## 2017-05-28 DIAGNOSIS — F419 Anxiety disorder, unspecified: Secondary | ICD-10-CM

## 2017-05-29 NOTE — Telephone Encounter (Signed)
Last seen 05/20/17  Dr Clayton Bibles

## 2017-05-30 ENCOUNTER — Ambulatory Visit: Payer: Medicare Other | Admitting: Pediatrics

## 2017-05-30 ENCOUNTER — Encounter: Payer: Self-pay | Admitting: Pediatrics

## 2017-05-30 ENCOUNTER — Ambulatory Visit (INDEPENDENT_AMBULATORY_CARE_PROVIDER_SITE_OTHER): Payer: Medicare Other

## 2017-05-30 ENCOUNTER — Telehealth: Payer: Self-pay | Admitting: Pediatrics

## 2017-05-30 VITALS — BP 131/85 | HR 95 | Temp 97.2°F | Ht 64.5 in | Wt 189.0 lb

## 2017-05-30 DIAGNOSIS — R109 Unspecified abdominal pain: Secondary | ICD-10-CM

## 2017-05-30 DIAGNOSIS — F339 Major depressive disorder, recurrent, unspecified: Secondary | ICD-10-CM | POA: Diagnosis not present

## 2017-05-30 DIAGNOSIS — F419 Anxiety disorder, unspecified: Secondary | ICD-10-CM

## 2017-05-30 DIAGNOSIS — R399 Unspecified symptoms and signs involving the genitourinary system: Secondary | ICD-10-CM

## 2017-05-30 LAB — URINALYSIS, COMPLETE
BILIRUBIN UA: NEGATIVE
Glucose, UA: NEGATIVE
KETONES UA: NEGATIVE
LEUKOCYTES UA: NEGATIVE
Nitrite, UA: NEGATIVE
PROTEIN UA: NEGATIVE
RBC UA: NEGATIVE
SPEC GRAV UA: 1.015 (ref 1.005–1.030)
Urobilinogen, Ur: 0.2 mg/dL (ref 0.2–1.0)
pH, UA: 7 (ref 5.0–7.5)

## 2017-05-30 LAB — MICROSCOPIC EXAMINATION
Bacteria, UA: NONE SEEN
Epithelial Cells (non renal): NONE SEEN /hpf (ref 0–10)
RBC, UA: NONE SEEN /hpf (ref 0–?)
Renal Epithel, UA: NONE SEEN /hpf

## 2017-05-30 MED ORDER — BUPROPION HCL 75 MG PO TABS: 75.0000 mg | ORAL_TABLET | Freq: Two times a day (BID) | ORAL | 1 refills | Status: DC

## 2017-05-30 MED ORDER — LORAZEPAM 0.5 MG PO TABS
0.5000 mg | ORAL_TABLET | Freq: Two times a day (BID) | ORAL | 0 refills | Status: DC | PRN
Start: 2017-05-30 — End: 2017-07-15

## 2017-05-30 NOTE — Telephone Encounter (Signed)
Patient called with complaints of lower abdominal pain and frequent urination.  Patient states that she has taken AZO and has been drinking cranberry juice.

## 2017-05-30 NOTE — Progress Notes (Addendum)
  Subjective:   Patient ID: Amber Rivas, female    DOB: 21-Aug-1945, 72 y.o.   MRN: [id number] CC: Urinary Frequency; Abdominal Pain; and Back Pain  HPI: [name] is a 72 y.o. female presenting for Urinary Frequency; Abdominal Pain; and Back Pain  Treated for UTI with keflex on 2/18. Had dysuria 1-2 days before treatment started.  UA at that time unremarkable.  Symptoms improved with keflex. Now still with symptoms, has some lower abd pain.  Feels like a "urinary tract infection".  Has some lower back pain as well.  Has been getting up to go to the bathroom multiple times at night.  Drinks fluids up until that time.  No dysuria now.  No bladder spasms.  No fevers.  Has bowel movements regularly.  No pelvic pressure.  Recently started on Lexapro for anxiety depression.  Cause headaches so she stopped.  Took for over a week.  Headache stopped when she stopped the medicine.  Interested in trying Wellbutrin.  Relation with son is still stressful.  She no longer has any Ativan.  Does not take it every day.  Had it before for acute anxiety.  Relevant past medical, surgical, family and social history reviewed. Allergies and medications reviewed and updated. Social History   Tobacco Use  Smoking Status Never Smoker  Smokeless Tobacco Never Used   ROS: Per HPI   Objective:    BP 131/85   Pulse 95   Temp (!) 97.2 F (36.2 C) (Oral)   Ht 5' 4.5" (1.638 m)   Wt 189 lb (85.7 kg)   BMI 31.94 kg/m   Wt Readings from Last 3 Encounters:  05/30/17 189 lb (85.7 kg)  05/20/17 183 lb (83 kg)  05/13/17 188 lb (85.3 kg)    Gen: NAD, alert, cooperative with exam, NCAT EYES: EOMI, no conjunctival injection, or no icterus ENT:  OP without erythema LYMPH: no cervical LAD CV: NRRR, normal S1/S2, no murmur, distal pulses 2+ b/l Resp: CTABL, no wheezes, normal WOB Abd: +BS, soft, mildly tender lower abdomen.  ND. no guarding or organomegaly.  No CVA tenderness Ext: No edema, warm Neuro: Alert  and oriented, strength equal b/l UE and LE, coordination grossly normal MSK: normal muscle bulk  Assessment & Plan:  L[place] was seen today for urinary frequency, abdominal pain and back pain.  Diagnoses and all orders for this visit:  UTI symptoms UA normal.  We will follow-up culture.   -     Urinalysis, Complete -     Urine Culture; Future  Anxiety #10 tabs given.  No refills.  For acute panic attacks only. -     LORazepam (ATIVAN) 0.5 MG tablet; Take 1 tablet (0.5 mg total) by mouth 2 (two) times daily as needed for anxiety.  Depression, recurrent ([place]) Start below. -     buPROPion (WELLBUTRIN) 75 MG tablet; Take 1 tablet (75 mg total) by mouth 2 (two) times daily.  Abdominal pain, unspecified abdominal location Xray to eval constipation with large amount of stool initial read. Will treat constipation with miralax, take four doses twice, reassess symptoms. -     DG Abd 1 View; Future   Follow up plan: Return in about 4 weeks (around 06/27/2017). Assunta Found, MD [place]

## 2017-05-30 NOTE — Telephone Encounter (Signed)
Gave appt for today with Evette Doffing per patients request.

## 2017-05-30 NOTE — Telephone Encounter (Signed)
Drink lots of fluids, would need to be seen for UTI symptoms if ongoing so we can test urine

## 2017-06-12 ENCOUNTER — Telehealth: Payer: Self-pay

## 2017-06-12 NOTE — Telephone Encounter (Signed)
[facility] - left message.  

## 2017-06-24 ENCOUNTER — Ambulatory Visit: Payer: Medicare Other | Admitting: Endocrinology

## 2017-07-01 ENCOUNTER — Ambulatory Visit: Payer: Medicare Other | Admitting: Endocrinology

## 2017-07-01 ENCOUNTER — Encounter: Payer: Self-pay | Admitting: Endocrinology

## 2017-07-01 VITALS — BP 142/84 | HR 74 | Wt 184.0 lb

## 2017-07-01 DIAGNOSIS — E039 Hypothyroidism, unspecified: Secondary | ICD-10-CM | POA: Diagnosis not present

## 2017-07-01 DIAGNOSIS — E559 Vitamin D deficiency, unspecified: Secondary | ICD-10-CM | POA: Diagnosis not present

## 2017-07-01 LAB — VITAMIN D 25 HYDROXY (VIT D DEFICIENCY, FRACTURES): VITD: 43.78 ng/mL (ref 30.00–100.00)

## 2017-07-01 LAB — TSH: TSH: 0.09 u[IU]/mL — ABNORMAL LOW (ref 0.35–4.50)

## 2017-07-01 MED ORDER — LEVOTHYROXINE SODIUM 88 MCG PO TABS: 88.0000 ug | ORAL_TABLET | Freq: Every day | ORAL | 3 refills | Status: DC

## 2017-07-01 NOTE — Patient Instructions (Addendum)
blood tests are requested for you today.  We'll let you know about the results.   Dr Evette Doffing would be happy to examine your neck each year, as part of your annual checkup.   I would be happy to see you back here as needed.

## 2017-07-01 NOTE — Progress Notes (Signed)
Subjective:    Patient ID: Amber Rivas, female    DOB: 04/01/1945, 72 y.o.   MRN: [id number]  HPI Pt returns for f/u of multinodular thyroid (dx'ed 2018, incidentally on a CT, she noted to have a left thyroid nodule; she had had thyroidectomy in late 2018).  She does not notice any swelling at the neck.   Hypothyroidism was dx'ed in 1977.  she takes synthroid as rx'ed.  Fatigue is better.   vit-D deficiency: she denies cramps.  She takes vit-D, 4000 units/day.   Past Medical History:  Diagnosis Date  . Arthritis    foot  . Headache    Migraines  . History of hiatal hernia    slight noted on imaging  . Hyperthyroidism   . Pneumonia    history of    Past Surgical History:  Procedure Laterality Date  . CHOLECYSTECTOMY  2005  . COLONOSCOPY N/A 04/28/2015   Procedure: COLONOSCOPY;  Surgeon: Rogene Houston, MD;  [facility];  Service: Endoscopy;  Laterality: N/A;  730  . right ankle surgery    . THYROIDECTOMY Left 02/07/2017   Procedure: TOTAL THYROIDECTOMY;  Surgeon: Michael Boston, MD;  Location: [facility];  Service: General;  Laterality: Left;  GENERAL AND LOCAL     Social History   Socioeconomic History  . Marital status: Married    Spouse name: Not on file  . Number of children: Not on file  . Years of education: Not on file  . Highest education level: Not on file  Occupational History  . Not on file  Social Needs  . Financial resource strain: Not on file  . Food insecurity:    Worry: Not on file    Inability: Not on file  . Transportation needs:    Medical: Not on file    Non-medical: Not on file  Tobacco Use  . Smoking status: Never Smoker  . Smokeless tobacco: Never Used  Substance and Sexual Activity  . Alcohol use: Yes    Comment: rare  . Drug use: No  . Sexual activity: Not on file  Lifestyle  . Physical activity:    Days per week: Not on file    Minutes per session: Not on file  . Stress: Not on file  Relationships  . Social connections:     Talks on phone: Not on file    Gets together: Not on file    Attends religious service: Not on file    Active member of club or organization: Not on file    Attends meetings of clubs or organizations: Not on file    Relationship status: Not on file  . Intimate partner violence:    Fear of current or ex partner: Not on file    Emotionally abused: Not on file    Physically abused: Not on file    Forced sexual activity: Not on file  Other Topics Concern  . Not on file  Social History Narrative  . Not on file    Current Outpatient Medications on File Prior to Visit  Medication Sig Dispense Refill  . acetaminophen (TYLENOL) 500 MG tablet Take 500 mg by mouth every 6 (six) hours as needed for mild pain or moderate pain.    [name] aspirin EC 81 MG tablet Take 81 mg by mouth daily as needed for mild pain or moderate pain.     [name] aspirin-acetaminophen-caffeine (EXCEDRIN MIGRAINE) 250-250-65 MG tablet Take 1 tablet by mouth every 6 (six) hours  as needed for headache.     . B-COMPLEX-C PO Take 1 tablet by mouth daily.    [name] buPROPion (WELLBUTRIN) 75 MG tablet Take 1 tablet (75 mg total) by mouth 2 (two) times daily. 60 tablet 1  . Multiple Vitamin (MULTIVITAMIN WITH MINERALS) TABS tablet Take 1 tablet by mouth daily.    . Omega-3 Fatty Acids (OMEGA 3 PO) Take 1 capsule by mouth daily.     . TURMERIC PO Take 1 tablet by mouth daily.    [name] LORazepam (ATIVAN) 0.5 MG tablet Take 1 tablet (0.5 mg total) by mouth 2 (two) times daily as needed for anxiety. (Patient not taking: Reported on 07/01/2017) 10 tablet 0   No current facility-administered medications on file prior to visit.     Allergies  Allergen Reactions  . Sulfur Anaphylaxis    Family History  Problem Relation Age of Onset  . Colon cancer Brother   . Cancer Brother   . Lung disease Sister   . Hypothyroidism Sister   . Cancer Brother     BP (!) 142/84 (BP Location: Left Arm, Patient Position: Sitting, Cuff Size: Normal)   Pulse 74    Wt 184 lb (83.5 kg)   SpO2 96%   BMI 31.10 kg/m   Review of Systems Denies numbness.  She has lost weight, due to her efforts.     Objective:   Physical Exam VITAL SIGNS:  See vs page GENERAL: no distress Neck: a healed scar is present.  I do not appreciate a nodule in the thyroid or elsewhere in the neck      Assessment & Plan:  Vit-D def: recheck today.  postsurgical hypothyroidism: recheck today.   Patient Instructions  blood tests are requested for you today.  We'll let you know about the results.   Dr Evette Doffing would be happy to examine your neck each year, as part of your annual checkup.   I would be happy to see you back here as needed.

## 2017-07-04 ENCOUNTER — Telehealth: Payer: Self-pay

## 2017-07-04 NOTE — Telephone Encounter (Signed)
[facility] - left message.  

## 2017-07-15 ENCOUNTER — Ambulatory Visit: Payer: Medicare Other | Admitting: Pediatrics

## 2017-07-15 ENCOUNTER — Encounter: Payer: Self-pay | Admitting: Pediatrics

## 2017-07-15 VITALS — BP 114/76 | HR 76 | Temp 96.6°F | Ht 64.5 in | Wt 181.4 lb

## 2017-07-15 DIAGNOSIS — Z6830 Body mass index (BMI) 30.0-30.9, adult: Secondary | ICD-10-CM

## 2017-07-15 DIAGNOSIS — F339 Major depressive disorder, recurrent, unspecified: Secondary | ICD-10-CM | POA: Diagnosis not present

## 2017-07-15 DIAGNOSIS — R002 Palpitations: Secondary | ICD-10-CM

## 2017-07-15 DIAGNOSIS — E039 Hypothyroidism, unspecified: Secondary | ICD-10-CM

## 2017-07-15 NOTE — Progress Notes (Signed)
  Subjective:   Patient ID: Amber Rivas, female    DOB: 1945-08-27, 72 y.o.   MRN: [id number] CC: Follow-up (6 month) Multiple medical problems HPI: Amber Rivas is a 72 y.o. female presenting for Follow-up (6 month)  Depression: Much improved on Wellbutrin.  Family stress improving  Heart palpitations: A couple times a week will feel an extra heartbeat.  Says she was evaluated for this years ago, said she had premature contractions.  No shortness of breath or chest pressure pain with exertion.  Stays fairly active, working in the garden regularly.  Elevated BMI: Decreasing carbohydrate intake.  Watching what she is eating more.  Relevant past medical, surgical, family and social history reviewed. Allergies and medications reviewed and updated. Social History   Tobacco Use  Smoking Status Never Smoker  Smokeless Tobacco Never Used   ROS: Per HPI   Objective:    BP 114/76   Pulse 76   Temp (!) 96.6 F (35.9 C) (Oral)   Ht 5' 4.5" (1.638 m)   Wt 181 lb 6.4 oz (82.3 kg)   BMI 30.66 kg/m   Wt Readings from Last 3 Encounters:  07/15/17 181 lb 6.4 oz (82.3 kg)  07/01/17 184 lb (83.5 kg)  05/30/17 189 lb (85.7 kg)    Gen: NAD, alert, cooperative with exam, NCAT EYES: EOMI, no conjunctival injection, or no icterus ENT:  TMs pearly gray b/l, OP without erythema LYMPH: no cervical LAD CV: NRRR, normal S1/S2, no murmur, distal pulses 2+ b/l Resp: CTABL, no wheezes, normal WOB Abd: +BS, soft, NTND.  Ext: No edema, warm Neuro: Alert and oriented MSK: normal muscle bulk Psych: Normal affect.  EKG: Normal sinus rhythm, heart rate 74.  Occasional PACs present.  Assessment & Plan:  Amber Rivas was seen today for follow-up multiple medical problems.  Diagnoses and all orders for this visit:  Depression, recurrent ([place]) Improving symptoms on Wellbutrin.  Continue for now.  Hypothyroidism, unspecified type Dose recently decreased.  Will need recheck in about 3 months.  BMI  30.0-30.9,adult Continue lifestyle changes.  Heart palpitations Occasional PACs.  Not increasing in frequency.  Discussed return precautions. -     EKG 12-Lead   Follow up plan: Return in about 3 months (around 10/14/2017). Assunta Found, MD [place]

## 2017-08-09 ENCOUNTER — Telehealth: Payer: Self-pay | Admitting: Licensed Clinical Social Worker

## 2017-08-09 NOTE — Telephone Encounter (Signed)
This [facility] specialist left message to call back with name and contact information. 

## 2017-09-09 ENCOUNTER — Telehealth: Payer: Self-pay

## 2017-09-09 NOTE — Telephone Encounter (Signed)
[facility] - Left Msg 

## 2017-10-03 ENCOUNTER — Other Ambulatory Visit: Payer: Self-pay | Admitting: Pediatrics

## 2017-10-03 DIAGNOSIS — F339 Major depressive disorder, recurrent, unspecified: Secondary | ICD-10-CM

## 2017-10-15 ENCOUNTER — Telehealth: Payer: Self-pay

## 2017-10-15 NOTE — Telephone Encounter (Signed)
Writer has made several attempts to contact the patient without success.  Patient will be placed on the inactive list. If the patient requires services again please submit another referral in the [facility] pool.   Writer will route this information to the PCP and Dr. Modesta Messing.

## 2017-10-30 ENCOUNTER — Other Ambulatory Visit: Payer: Self-pay | Admitting: Endocrinology

## 2017-10-30 ENCOUNTER — Telehealth: Payer: Self-pay

## 2017-10-30 NOTE — Telephone Encounter (Signed)
Error

## 2017-11-05 ENCOUNTER — Ambulatory Visit: Payer: Medicare Other

## 2017-11-06 ENCOUNTER — Other Ambulatory Visit: Payer: Self-pay | Admitting: Pediatrics

## 2017-11-06 DIAGNOSIS — F339 Major depressive disorder, recurrent, unspecified: Secondary | ICD-10-CM

## 2017-11-06 NOTE — Telephone Encounter (Signed)
Last seen 07/15/17

## 2017-12-10 ENCOUNTER — Ambulatory Visit (INDEPENDENT_AMBULATORY_CARE_PROVIDER_SITE_OTHER): Payer: Medicare Other

## 2017-12-10 ENCOUNTER — Ambulatory Visit (INDEPENDENT_AMBULATORY_CARE_PROVIDER_SITE_OTHER): Payer: Medicare Other | Admitting: *Deleted

## 2017-12-10 VITALS — BP 138/95 | HR 76 | Ht 64.5 in | Wt 179.8 lb

## 2017-12-10 DIAGNOSIS — Z0000 Encounter for general adult medical examination without abnormal findings: Secondary | ICD-10-CM

## 2017-12-10 DIAGNOSIS — Z780 Asymptomatic menopausal state: Secondary | ICD-10-CM

## 2017-12-10 DIAGNOSIS — E871 Hypo-osmolality and hyponatremia: Secondary | ICD-10-CM

## 2017-12-10 NOTE — Patient Instructions (Signed)
  Amber Rivas , Thank you for taking time to come for your Medicare Wellness Visit. I appreciate your ongoing commitment to your health goals. Please review the following plan we discussed and let me know if I can assist you in the future.   These are the goals we discussed: Goals    . Weight (lb) < 150 lb (68 kg)       This is a list of the screening recommended for you and due dates:  Health Maintenance  Topic Date Due  .  Hepatitis C: One time screening is recommended by Center for Disease Control  (CDC) for  adults born from [birth year] through 1963.   24-Jan-1946  . Mammogram  04/22/1997  . DEXA scan (bone density measurement)  04/22/2012  . Pneumonia vaccines (1 of 2 - PCV13) 04/22/2012  . Flu Shot  10/24/2017  . Tetanus Vaccine  08/24/2021  . Colon Cancer Screening  02/01/2026

## 2017-12-10 NOTE — Progress Notes (Signed)
Subjective:   Amber Rivas is a 72 y.o. female who presents for an Initial Medicare Annual Wellness Visit.  Amber Rivas retired from [employer] in 2010.  She enjoys photography, gardening, mowing the yard and crafts. She live at home with her Husband Amber Rivas) of 25 years, dog toby and 2 cats.  She has 1 child, 3 step-children and 7 grandchildren.  Amber Rivas exercises daily for at least 30 minutes and eats 3 healthy meals daily.  She has had 2 surgeries in the past year for thyroidectomy and screw removal of foot.  No other hospitalizations or ER visits in the past year. Overall Amber Rivas feels that her health is the same as it was a year ago.  Objective:    Today's Vitals   12/10/17 1119  BP: (!) 138/95  Pulse: 76  Weight: 179 lb 12.8 oz (81.6 kg)  Height: 5' 4.5" (1.638 m)   Body mass index is 30.39 kg/m.  Advanced Directives 02/07/2017 02/04/2017 04/30/2016 08/11/2015 04/28/2015  Does Patient Have a Medical Advance Directive? Yes Yes Yes Yes Yes  Type of Paramedic of [place];Living will [place];Living will Living will - Living will  Does patient want to make changes to medical advance directive? No - Patient declined - - - -  Copy Requested  Current Medications (verified) Outpatient Encounter Medications as of 12/10/2017  Medication Sig  . acetaminophen (TYLENOL) 500 MG tablet Take 500 mg by mouth every 6 (six) hours as needed for mild pain or moderate pain.  [name] aspirin EC 81 MG tablet Take 81 mg by mouth daily as needed for mild pain or moderate pain.   [name] aspirin-acetaminophen-caffeine (EXCEDRIN MIGRAINE) 250-250-65 MG tablet Take 1 tablet by mouth every 6 (six) hours as needed for headache.   . B-COMPLEX-C PO Take 1 tablet by mouth daily.  [name] buPROPion (WELLBUTRIN) 75 MG tablet TAKE 1 TABLET BY MOUTH TWICE DAILY  . levothyroxine (SYNTHROID, LEVOTHROID) 88 MCG tablet TAKE ONE TABLET BY MOUTH EVERY DAY BEFORE BREAKFAST  .  Multiple Vitamin (MULTIVITAMIN WITH MINERALS) TABS tablet Take 1 tablet by mouth daily.  . Omega-3 Fatty Acids (OMEGA 3 PO) Take 1 capsule by mouth daily.   . TURMERIC PO Take 1 tablet by mouth daily.   No facility-administered encounter medications on file as of 12/10/2017.     Allergies (verified) Sulfur   History: Past Medical History:  Diagnosis Date  . Arthritis    foot  . Headache    Migraines  . History of hiatal hernia    slight noted on imaging  . Hyperthyroidism   . Pneumonia    history of   Past Surgical History:  Procedure Laterality Date  . CHOLECYSTECTOMY  2005  . COLONOSCOPY N/A 04/28/2015   Procedure: COLONOSCOPY;  Surgeon: Rogene Houston, MD;  [facility];  Service: Endoscopy;  Laterality: N/A;  730  . right ankle surgery    . THYROIDECTOMY Left 02/07/2017   Procedure: TOTAL THYROIDECTOMY;  Surgeon: Michael Boston, MD;  Location: [facility];  Service: General;  Laterality: Left;  GENERAL AND LOCAL    Family History  Problem Relation Age of Onset  . Colon cancer Brother   . Cancer Brother   . Lung disease Sister   . Hypothyroidism Sister   . Cancer Brother    Social History   Socioeconomic History  . Marital status: Married    Spouse name: Amber Rivas  . Number of children:  4  . Years of education: Bachelors Degree  . Highest education level: Bachelor's degree (e.g., BA, AB, BS)  Occupational History  . Occupation: Retired    Fish farm manager: [employer]  . Financial resource strain: Not hard at all  . Food insecurity:    Worry: Never true    Inability: Never true  . Transportation needs:    Medical: No    Non-medical: No  Tobacco Use  . Smoking status: Never Smoker  . Smokeless tobacco: Never Used  Substance and Sexual Activity  . Alcohol use: Yes    Comment: rare  . Drug use: No  . Sexual activity: Yes    Birth control/protection: Post-menopausal  Lifestyle  . Physical activity:    Days per week: 7 days     Minutes per session: 40 min  . Stress: Not at all  Relationships  . Social connections:    Talks on phone: More than three times a week    Gets together: More than three times a week    Attends religious service: More than 4 times per year    Active member of club or organization: No    Attends meetings of clubs or organizations: Never    Relationship status: Married  Other Topics Concern  . Not on file  Social History Narrative  . Not on file    Tobacco Counseling No tobacco use   Clinical Intake:  Pre-visit preparation completed: Yes  Pain : No/denies pain     BMI - recorded: 30.7 Nutritional Status: BMI > 30  Obese Nutritional Risks: None Diabetes: No  How often do you need to have someone help you when you read instructions, pamphlets, or other written materials from your doctor or pharmacy?: 1 - Never What is the last grade level you completed in school?: Bachelors Degree  Interpreter Needed?: No  Information entered by :: Truett Mainland, LPN   Activities of Daily Living In your present state of health, do you have any difficulty performing the following activities: 12/10/2017 02/07/2017  Hearing? N N  Vision? N N  Difficulty concentrating or making decisions? N N  Walking or climbing stairs? N Y  Comment - -  Dressing or bathing? N N  Doing errands, shopping? N N  Some recent data might be hidden   NO difficulties with ADLs at this time  Immunizations and Health Maintenance Immunization History  Administered Date(s) Administered  . Influenza,inj,Quad PF,6+ Mos 01/14/2017  . Influenza,inj,quad, With Preservative 02/12/2017   Health Maintenance Due  Topic Date Due  . Hepatitis C Screening  [birth year]  . MAMMOGRAM  04/22/1997  . DEXA SCAN  04/22/2012  . PNA vac Low Risk Adult (1 of 2 - PCV13) 04/22/2012  . INFLUENZA VACCINE  10/24/2017  Records requested.    Patient Care Team: Eustaquio Maize, MD as PCP - General (Pediatrics) Renato Shin, MD  as Consulting Physician (Endocrinology) Wylene Simmer, MD as Consulting Physician (Orthopedic Surgery) Michael Boston, MD as Consulting Physician (General Surgery)  Indicate any recent Medical Services you may have received from other than [facility] providers in the past year (date may be approximate).     Assessment:   This is a routine wellness examination for Amber Rivas.  Hearing/Vision screen No vision or hearing loss noted at this time  Dietary issues and exercise activities discussed: Current Exercise Habits: Home exercise routine, Type of exercise: walking, Time (Minutes): 40, Frequency (Times/Week): 7, Weekly Exercise (Minutes/Week): 280, Intensity: Mild, Exercise limited by:  None identified  Goals    . Weight (lb) < 150 lb (68 kg)      Depression Screen PHQ 2/9 Scores 12/10/2017 07/15/2017 05/30/2017 05/20/2017 05/20/2017 05/13/2017 01/14/2017  PHQ - 2 Score 0 0 0 6 3 0 0  PHQ- 9 Score - - - 12 11 - -    Fall Risk Fall Risk  12/10/2017 07/15/2017 05/30/2017 05/20/2017 05/13/2017  Falls in the past year? No No No No No    Is the patient's home free of loose throw rugs in walkways, pet beds, electrical cords, etc?   yes      Grab bars in the bathroom? yes      Handrails on the stairs?   yes      Adequate lighting?   yes  Timed Get Up and Go Performed   Cognitive Function: MMSE - Mini Mental State Exam 12/10/2017  Orientation to time 5  Orientation to Place 5  Registration 3  Attention/ Calculation 5  Recall 3  Language- name 2 objects 2  Language- repeat 1  Language- follow 3 step command 3  Language- read & follow direction 1  Write a sentence 1  Copy design 1  Total score 30  No memory loss noted at this time      Screening Tests Health Maintenance  Topic Date Due  . Hepatitis C Screening  11-27-1945  . MAMMOGRAM  04/22/1997  . DEXA SCAN  04/22/2012  . PNA vac Low Risk Adult (1 of 2 - PCV13) 04/22/2012  . INFLUENZA VACCINE  10/24/2017  . TETANUS/TDAP  08/24/2021  .  COLONOSCOPY  02/01/2026  Records requested  Qualifies for Shingles Vaccine? Records requested  Cancer Screenings: Lung: Low Dose CT Chest recommended if Age 54-80 years, 30 pack-year currently smoking OR have quit w/in 15years. Patient does not qualify. Breast: Up to date on Mammogram? No   Up to date of Bone Density/Dexa? Yes Colorectal:   Additional Screenings: Hepatitis C Screening:      Plan:  Encouraged to continue to exercise for at least 30 minutes, 3 times weekly.  Encouraged to continue to eat 3 healthy meals daily that consist of lean proteins, fruits and vegetables.  Encourage to sign records release.  Waiting on records from Dr. Maryjo Rochester office.  Copy of Advance directive requested. Encouraged to keep all follow up appointments and to contact this office with any concerns or questions  I have personally reviewed and noted the following in the patient's chart:   . Medical and social history . Use of alcohol, tobacco or illicit drugs  . Current medications and supplements . Functional ability and status . Nutritional status . Physical activity . Advanced directives . List of other physicians . Hospitalizations, surgeries, and ER visits in previous 12 months . Vitals . Screenings to include cognitive, depression, and falls . Referrals and appointments  In addition, I have reviewed and discussed with patient certain preventive protocols, quality metrics, and best practice recommendations. A written personalized care plan for preventive services as well as general preventive health recommendations were provided to patient.     Wardell Heath, LPN   07/03/7351

## 2017-12-11 ENCOUNTER — Encounter: Payer: Self-pay | Admitting: Pediatrics

## 2017-12-11 ENCOUNTER — Ambulatory Visit: Payer: Medicare Other | Admitting: Pediatrics

## 2017-12-11 VITALS — BP 129/86 | HR 81 | Temp 97.0°F | Ht 64.5 in | Wt 179.8 lb

## 2017-12-11 DIAGNOSIS — F339 Major depressive disorder, recurrent, unspecified: Secondary | ICD-10-CM | POA: Diagnosis not present

## 2017-12-11 DIAGNOSIS — E039 Hypothyroidism, unspecified: Secondary | ICD-10-CM

## 2017-12-11 NOTE — Patient Instructions (Addendum)
[facility]  [address] [place], [place] [address] (ph) [phone]  [address], [place], [place] [address] (ph) [phone]  [facility] Contact: [phone]  [email or web address]

## 2017-12-11 NOTE — Progress Notes (Signed)
  Subjective:   Patient ID: Amber Rivas, female    DOB: 10/28/1945, 72 y.o.   MRN: [id number] CC: Medical Management of Chronic Issues  HPI: Amber Rivas is a 71 y.o. female   Depression: Much improved on the Wellbutrin.  Taking it daily, sometimes twice a day.  Has been taking it twice a day more often recently with increased stress at home.  Stress primarily related to family.  Hypothyroidism: Due for follow-up thyroid check.  Will get today.  Relevant past medical, surgical, family and social history reviewed. Allergies and medications reviewed and updated. Social History   Tobacco Use  Smoking Status Never Smoker  Smokeless Tobacco Never Used   ROS: Per HPI   Objective:    BP 129/86   Pulse 81   Temp (!) 97 F (36.1 C) (Oral)   Ht 5' 4.5" (1.638 m)   Wt 179 lb 12.8 oz (81.6 kg)   BMI 30.39 kg/m   Wt Readings from Last 3 Encounters:  12/11/17 179 lb 12.8 oz (81.6 kg)  12/10/17 179 lb 12.8 oz (81.6 kg)  07/15/17 181 lb 6.4 oz (82.3 kg)    Gen: NAD, alert, cooperative with exam, NCAT EYES: EOMI, no conjunctival injection, or no icterus ENT:  OP without erythema LYMPH: no cervical LAD CV: NRRR, normal S1/S2, no murmur, distal pulses 2+ b/l Resp: CTABL, no wheezes, normal WOB Abd: +BS, soft, NTND.  Ext: No edema, warm Neuro: Alert and oriented MSK: normal muscle bulk Psych: normal affect  Assessment & Plan:  Amber Rivas was seen today for medical management of chronic issues.  Diagnoses and all orders for this visit:  Depression, recurrent ([place]) Stable, continue current medicines.  Hypothyroidism, unspecified type -     TSH -     BMP8+EGFR   Follow up plan: Return in about 3 months (around 03/12/2018). Assunta Found, MD [place]

## 2017-12-12 LAB — BMP8+EGFR
BUN / CREAT RATIO: 22 (ref 12–28)
BUN: 16 mg/dL (ref 8–27)
CO2: 24 mmol/L (ref 20–29)
CREATININE: 0.73 mg/dL (ref 0.57–1.00)
Calcium: 9.6 mg/dL (ref 8.7–10.3)
Chloride: 100 mmol/L (ref 96–106)
GFR calc Af Amer: 96 mL/min/{1.73_m2} (ref >59)
GFR calc non Af Amer: 84 mL/min/{1.73_m2} (ref >59)
GLUCOSE: 78 mg/dL (ref 65–99)
Potassium: 4.5 mmol/L (ref 3.5–5.2)
SODIUM: 138 mmol/L (ref 134–144)

## 2017-12-12 LAB — TSH: TSH: 4.22 u[IU]/mL (ref 0.450–4.500)

## 2017-12-25 ENCOUNTER — Other Ambulatory Visit: Payer: Self-pay | Admitting: Pediatrics

## 2017-12-25 ENCOUNTER — Telehealth: Payer: Self-pay | Admitting: *Deleted

## 2017-12-25 DIAGNOSIS — R6889 Other general symptoms and signs: Secondary | ICD-10-CM

## 2017-12-25 DIAGNOSIS — I739 Peripheral vascular disease, unspecified: Secondary | ICD-10-CM

## 2017-12-25 NOTE — Progress Notes (Signed)
Order is in, under CV procedures, Carlon is this something we can schedule in [place]?

## 2017-12-25 NOTE — Progress Notes (Signed)
I put in order for formal ABI to be done at [facility] in [place]. The test looks at how well blood flow is getting to her feet and the screening by the insurance nurse showed decreased blood flow to L foot. If the confirmation test still shows that, we will have her see vascular doctor for further evaluation. If any questions I'm happy to see her back to discuss. Continue to stay as active as she can, walking regularly as this will help improve blood flow.

## 2017-12-25 NOTE — Progress Notes (Signed)
Patient aware of your note.  No order in chart for this test?  Please advise.

## 2017-12-25 NOTE — Progress Notes (Signed)
Pt is scheduled for 01/16/18 at 11am in [place]. She will be there for 2 hours

## 2017-12-25 NOTE — Telephone Encounter (Signed)
Incoming call from [facility] NP Mavis ABI performed on pt R 1.31 L 0.52

## 2017-12-25 NOTE — Telephone Encounter (Signed)
Order in for formal ABIs, see separate phone note.

## 2018-01-16 ENCOUNTER — Ambulatory Visit (INDEPENDENT_AMBULATORY_CARE_PROVIDER_SITE_OTHER): Payer: Medicare Other

## 2018-01-16 DIAGNOSIS — I739 Peripheral vascular disease, unspecified: Secondary | ICD-10-CM

## 2018-02-11 ENCOUNTER — Encounter: Payer: Self-pay | Admitting: Pediatrics

## 2018-02-11 ENCOUNTER — Ambulatory Visit: Payer: Medicare Other | Admitting: Pediatrics

## 2018-02-11 VITALS — BP 140/95 | HR 77 | Temp 97.6°F | Wt 181.8 lb

## 2018-02-11 DIAGNOSIS — H04123 Dry eye syndrome of bilateral lacrimal glands: Secondary | ICD-10-CM | POA: Diagnosis not present

## 2018-02-11 NOTE — Progress Notes (Signed)
  Subjective:   Patient ID: Amber Rivas, female    DOB: [birth year], 71 y.o.   MRN: [id number] CC: bilateral eye irritation (started on Friday, dry and itchy eyes, denies drainage)  HPI: Amber Rivas is a 72 y.o. female   No crusting of the eyes.  Does have pain after she rubs them.  Has been using allergy relief drops in the drugstore, also atrially eyedrops in the drugstore.  Taking Flonase regularly, on antihistamine daily for the last few days.  Eyes continue to feel irritated despite eyedrops and allergy meds.  Eyes feel sensitive to light.   No new medicines.  She is not sure what set off this episode.  Has had similar symptoms in the past, not quite this bad.  Follows with Dr. Schuyler Amor in [place] for eye problems.  Relevant past medical, surgical, family and social history reviewed. Allergies and medications reviewed and updated. Social History   Tobacco Use  Smoking Status Never Smoker  Smokeless Tobacco Never Used   ROS: Per HPI   Objective:    BP (!) 140/95   Pulse 77   Temp 97.6 F (36.4 C) (Oral)   Wt 181 lb 12.8 oz (82.5 kg)   BMI 30.72 kg/m   Wt Readings from Last 3 Encounters:  02/11/18 181 lb 12.8 oz (82.5 kg)  12/11/17 179 lb 12.8 oz (81.6 kg)  12/10/17 179 lb 12.8 oz (81.6 kg)    Gen: NAD, alert, cooperative with exam, NCAT EYES: EOMI, minimal conjunctival injection, or no icterus, PERRL ENT:  TMs pearly gray b/l, OP without erythema LYMPH: no cervical LAD CV: NRRR, normal S1/S2, no murmur, distal pulses 2+ b/l Resp: CTABL, no wheezes, normal WOB Ext: No edema, warm Neuro: Alert and oriented Assessment & Plan:  Amber Rivas was seen today for bilateral eye irritation.  Diagnoses and all orders for this visit:  Dry eyes Use over-the-counter eyedrops, frequent heavy blinking to improve to production, compresses as needed.  Continue Flonase and antihistamine.  If not improved by tomorrow needs to see eye doctor.  Patient will call to schedule appointment.  If  any trouble getting it, let me know.  Eyes become red, painful, needs to be seen immediately.  Follow up plan: As needed Assunta Found, MD [place]

## 2018-02-21 ENCOUNTER — Other Ambulatory Visit: Payer: Self-pay | Admitting: Endocrinology

## 2018-05-27 ENCOUNTER — Ambulatory Visit: Payer: Medicare Other | Admitting: Family Medicine

## 2018-05-28 ENCOUNTER — Other Ambulatory Visit: Payer: Self-pay | Admitting: Pediatrics

## 2018-05-28 DIAGNOSIS — F339 Major depressive disorder, recurrent, unspecified: Secondary | ICD-10-CM

## 2018-06-03 ENCOUNTER — Ambulatory Visit: Payer: Medicare Other | Admitting: Family Medicine

## 2018-06-03 ENCOUNTER — Encounter: Payer: Self-pay | Admitting: Family Medicine

## 2018-06-03 VITALS — BP 138/88 | HR 80 | Temp 97.1°F | Ht 64.5 in | Wt 187.0 lb

## 2018-06-03 DIAGNOSIS — F339 Major depressive disorder, recurrent, unspecified: Secondary | ICD-10-CM

## 2018-06-03 DIAGNOSIS — Z6379 Other stressful life events affecting family and household: Secondary | ICD-10-CM

## 2018-06-03 DIAGNOSIS — G43909 Migraine, unspecified, not intractable, without status migrainosus: Secondary | ICD-10-CM

## 2018-06-03 DIAGNOSIS — E890 Postprocedural hypothyroidism: Secondary | ICD-10-CM | POA: Diagnosis not present

## 2018-06-03 DIAGNOSIS — E559 Vitamin D deficiency, unspecified: Secondary | ICD-10-CM | POA: Diagnosis not present

## 2018-06-03 MED ORDER — BUPROPION HCL ER (XL) 150 MG PO TB24: 150.0000 mg | ORAL_TABLET | Freq: Every day | ORAL | 1 refills | Status: DC

## 2018-06-03 MED ORDER — SUMATRIPTAN SUCCINATE 25 MG PO TABS: 25.0000 mg | ORAL_TABLET | Freq: Once | ORAL | 0 refills | Status: DC

## 2018-06-03 NOTE — Patient Instructions (Signed)
Stress Stress is a normal reaction to life events. Stress is what you feel when life demands more than you are used to, or more than you think you can handle. Some stress can be useful, such as studying for a test or meeting a deadline at work. Stress that occurs too often or for too long can cause problems. It can affect your emotional health and interfere with relationships and normal daily activities. Too much stress can weaken your body's defense system (immune system) and increase your risk for physical illness. If you already have a medical problem, stress can make it worse. What are the causes? All sorts of life events can cause stress. An event that causes stress for one person may not be stressful for another person. Major life events, whether positive or negative, commonly cause stress. Examples include:  Losing a job or starting a new job.  Losing a loved one.  Moving to a new town or home.  Getting married or divorced.  Having a baby.  Injury or illness. Less obvious life events can also cause stress, especially if they occur day after day or in combination with each other. Examples include:  Working long hours.  Driving in traffic.  Caring for children.  Being in debt.  Being in a difficult relationship. What are the signs or symptoms? Stress can cause emotional symptoms, including:  Anxiety. This is feeling worried, afraid, on edge, overwhelmed, or out of control.  Anger, including irritation or impatience.  Depression. This is feeling sad, down, helpless, or guilty.  Trouble focusing, remembering, or making decisions. Stress can cause physical symptoms, including:  Aches and pains. These may affect your head, neck, back, stomach, or other areas of your body.  Tight muscles or a clenched jaw.  Low energy.  Trouble sleeping. Stress can cause unhealthy behaviors, including:  Eating to feel better (overeating) or skipping meals.  Working too much or  putting off tasks.  Smoking, drinking alcohol, or using drugs to feel better. How is this diagnosed? Stress is diagnosed through an assessment by your health care provider. He or she may diagnose this condition based on:  Your symptoms and any stressful life events.  Your medical history.  Tests to rule out other causes of your symptoms. Depending on your condition, your health care provider may refer you to a specialist for further evaluation. How is this treated?  Stress management techniques are the recommended treatment for stress. Medicine is not typically recommended for the treatment of stress. Techniques to reduce your reaction to stressful life events include:  Stress identification. Monitor yourself for symptoms of stress and identify what causes stress for you. These skills may help you to avoid or prepare for stressful events.  Time management. Set your priorities, keep a calendar of events, and learn to say "no." Taking these actions can help you avoid making too many commitments. Techniques for coping with stress include:  Rethinking the problem. Try to think realistically about stressful events rather than ignoring them or overreacting. Try to find the positives in a stressful situation rather than focusing on the negatives.  Exercise. Physical exercise can release both physical and emotional tension. The key is to find a form of exercise that you enjoy and do it regularly.  Relaxation techniques. These relax the body and mind. The key is to find one or more that you enjoy and use the technique(s) regularly. Examples include: ? Meditation, deep breathing, or progressive relaxation techniques. ? Yoga or tai chi. ?  Biofeedback, mindfulness techniques, or journaling. ? Listening to music, being out in nature, or participating in other hobbies.  Practicing a healthy lifestyle. Eat a balanced diet, drink plenty of water, limit or avoid caffeine, and get plenty of  sleep.  Having a strong support network. Spend time with family, friends, or other people you enjoy being around. Express your feelings and talk things over with someone you trust. Counseling or talk therapy with a mental health professional may be helpful if you are having trouble managing stress on your own. Follow these instructions at home: Lifestyle   Avoid drugs.  Do not use any products that contain nicotine or tobacco, such as cigarettes and e-cigarettes. If you need help quitting, ask your health care provider.  Limit alcohol intake to no more than 1 drink a day for nonpregnant women and 2 drinks a day for men. One drink equals 12 oz of beer, 5 oz of wine, or 1 oz of hard liquor.  Do not use alcohol or drugs to relax.  Eat a balanced diet that includes fresh fruits and vegetables, whole grains, lean meats, fish, eggs, and beans, and low-fat dairy. Avoid processed foods and foods high in added fat, sugar, and salt.  Exercise at least 30 minutes on 5 or more days each week.  Get 7-8 hours of sleep each night. General instructions   Practice stress management techniques as discussed with your health care provider.  Drink enough fluid to keep your urine clear or pale yellow.  Take over-the-counter and prescription medicines only as told by your health care provider.  Keep all follow-up visits as told by your health care provider. This is important. Contact a health care provider if:  Your symptoms get worse.  You have new symptoms.  You feel overwhelmed by your problems and can no longer manage them on your own. Get help right away if:  You have thoughts of hurting yourself or others. If you ever feel like you may hurt yourself or others, or have thoughts about taking your own life, get help right away. You can go to your nearest emergency department or call:  Your local emergency services (911 in the [place]  A suicide crisis helpline, such as the [place] at 520-864-5644. This is open 24 hours a day. Summary  Stress is a normal reaction to life events. It can cause problems if it happens too often or for too long.  Practicing stress management techniques is the best way to treat stress.  Counseling or talk therapy with a mental health professional may be helpful if you are having trouble managing stress on your own. This information is not intended to replace advice given to you by your health care provider. Make sure you discuss any questions you have with your health care provider. Document Released: 09/05/2000 Document Revised: 05/02/2016 Document Reviewed: 05/02/2016 Elsevier Interactive Patient Education  2019 [employer].   Sumatriptan tablets What is this medicine? SUMATRIPTAN (soo ma TRIP tan) is used to treat migraines with or without aura. An aura is a strange feeling or visual disturbance that warns you of an attack. It is not used to prevent migraines. This medicine may be used for other purposes; ask your health care provider or pharmacist if you have questions. COMMON BRAND NAME(S): Imitrex, Migraine Pack What should I tell my health care provider before I take this medicine? They need to know if you have any of these conditions: -cigarette smoker -circulation problems in fingers  and toes -diabetes -heart disease -high blood pressure -high cholesterol -history of irregular heartbeat -history of stroke -kidney disease -liver disease -stomach or intestine problems -an unusual or allergic reaction to sumatriptan, other medicines, foods, dyes, or preservatives -pregnant or trying to get pregnant -breast-feeding How should I use this medicine? Take this medicine by mouth with a glass of water. Follow the directions on the prescription label. Do not take it more often than directed. Talk to your pediatrician regarding the use of this medicine in children. Special care may be needed. Overdosage: If  you think you have taken too much of this medicine contact a poison control center or emergency room at once. NOTE: This medicine is only for you. Do not share this medicine with others. What if I miss a dose? This does not apply. This medicine is not for regular use. What may interact with this medicine? Do not take this medicine with any of the following medicines: -certain medicines for migraine headache like almotriptan, eletriptan, frovatriptan, naratriptan, rizatriptan, sumatriptan, zolmitriptan -ergot alkaloids like dihydroergotamine, ergonovine, ergotamine, methylergonovine -MAOIs like Carbex, Eldepryl, Marplan, Nardil, and Parnate This medicine may also interact with the following medications: -certain medicines for depression, anxiety, or psychotic disorders This list may not describe all possible interactions. Give your health care provider a list of all the medicines, herbs, non-prescription drugs, or dietary supplements you use. Also tell them if you smoke, drink alcohol, or use illegal drugs. Some items may interact with your medicine. What should I watch for while using this medicine? Visit your healthcare professional for regular checks on your progress. Tell your healthcare professional if your symptoms do not start to get better or if they get worse. You may get drowsy or dizzy. Do not drive, use machinery, or do anything that needs mental alertness until you know how this medicine affects you. Do not stand up or sit up quickly, especially if you are an older patient. This reduces the risk of dizzy or fainting spells. Alcohol may interfere with the effect of this medicine. Tell your healthcare professional right away if you have any change in your eyesight. If you take migraine medicines for 10 or more days a month, your migraines may get worse. Keep a diary of headache days and medicine use. Contact your healthcare professional if your migraine attacks occur more frequently. What  side effects may I notice from receiving this medicine? Side effects that you should report to your doctor or health care professional as soon as possible: -allergic reactions like skin rash, itching or hives, swelling of the face, lips, or tongue -changes in vision -chest pain or chest tightness -signs and symptoms of a dangerous change in heartbeat or heart rhythm like chest pain; dizziness; fast, irregular heartbeat; palpitations; feeling faint or lightheaded; falls; breathing problems -signs and symptoms of a stroke like changes in vision; confusion; trouble speaking or understanding; severe headaches; sudden numbness or weakness of the face, arm or leg; trouble walking; dizziness; loss of balance or coordination -signs and symptoms of serotonin syndrome like irritable; confusion; diarrhea; fast or irregular heartbeat; muscle twitching; stiff muscles; trouble walking; sweating; high fever; seizures; chills; vomiting Side effects that usually do not require medical attention (report to your doctor or health care professional if they continue or are bothersome): -diarrhea -dizziness -drowsiness -dry mouth -headache -nausea, vomiting -pain, tingling, numbness in the hands or feet -stomach pain This list may not describe all possible side effects. Call your doctor for medical advice about side effects.  You may report side effects to FDA at 1-800-FDA-1088. Where should I keep my medicine? Keep out of the reach of children. Store at room temperature between 2 and 30 degrees C (36 and 86 degrees F). Throw away any unused medicine after the expiration date. NOTE: This sheet is a summary. It may not cover all possible information. If you have questions about this medicine, talk to your doctor, pharmacist, or health care provider.  2019 Elsevier/Gold Standard (2017-09-24 15:05:37)

## 2018-06-03 NOTE — Progress Notes (Signed)
Subjective: CC: hypothyroidism PCP: [name], DO VQM:GQQPYP W Headen is a 73 y.o. female presenting to clinic today for:  1. Hypothyroidism History: History of multinodular goiter status post thyroidectomy in November 2018.  Patient is here for interval follow-up of thyroid disease.  She reports compliance with Synthroid 88 mcg daily. Denies tremor, unplanned weight loss or weight gain (she's gained some weight but attributes this to poor diet/ stress).  No visual disturbance.  2. Vit D deficiency Patient with history of vitamin D deficiency.  Her DEXA scan was within normal range in September of last year. Compliant OTC Vit D supplementation.  3. Depression/ stress Patient reports significant increase in depression and stress related to multiple family members that are sick.  1 of her brothers has colon cancer which appears to be progressing and not responding to treatment.  She notes that she has other family members that are going through stressful times as well, specifically her son who has schizophrenia/bipolar disorder and is not compliant with medications.  She frequently worries about him.  She recently self titrated her Wellbutrin up from 75 mg daily to 150 mg daily.  She is only taking this once per day and does notice that it seems to wear off in the evening times.  She would also like to see a counselor if possible.  4.  Migraine headaches Patient reports longstanding history of migraine headaches that typically responded to Excedrin Migraine in the past.  She has been seen in the emergency department once in the past for severe migraine headache that did not respond to medications and was given Imitrex.  She notes that this medication helped substantially and is wondering if she can get that today.  She on average is having 4-5 migraine headaches per month.  She finds that they seem to be more frequent and severe since onset of increased stress and depression.  Denies any  strokelike symptoms.  ROS: Per HPI  Allergies  Allergen Reactions  . Sulfur Anaphylaxis   Past Medical History:  Diagnosis Date  . Arthritis    foot  . Headache    Migraines  . History of hiatal hernia    slight noted on imaging  . Hyperthyroidism   . Pneumonia    history of    Current Outpatient Medications:  .  acetaminophen (TYLENOL) 500 MG tablet, Take 500 mg by mouth every 6 (six) hours as needed for mild pain or moderate pain., Disp: , Rfl:  .  aspirin EC 81 MG tablet, Take 81 mg by mouth daily as needed for mild pain or moderate pain. , Disp: , Rfl:  .  aspirin-acetaminophen-caffeine (EXCEDRIN MIGRAINE) 250-250-65 MG tablet, Take 1 tablet by mouth every 6 (six) hours as needed for headache. , Disp: , Rfl:  .  B-COMPLEX-C PO, Take 1 tablet by mouth daily., Disp: , Rfl:  .  buPROPion (WELLBUTRIN) 75 MG tablet, TAKE 1 TABLET BY MOUTH TWICE DAILY, Disp: 60 tablet, Rfl: 0 .  levothyroxine (SYNTHROID, LEVOTHROID) 88 MCG tablet, TAKE 1 TABLET BY MOUTH EVERY DAY BEFORE BREAKFAST, Disp: 30 tablet, Rfl: 3 .  Multiple Vitamin (MULTIVITAMIN WITH MINERALS) TABS tablet, Take 1 tablet by mouth daily., Disp: , Rfl:  .  Omega-3 Fatty Acids (OMEGA 3 PO), Take 1 capsule by mouth daily. , Disp: , Rfl:  .  TURMERIC PO, Take 1 tablet by mouth daily., Disp: , Rfl:  Social History   Socioeconomic History  . Marital status: Married  Spouse name: [name]  . Number of children: 4  . Years of education: Bachelors Degree  . Highest education level: Bachelor's degree (e.g., BA, AB, BS)  Occupational History  . Occupation: Retired    Fish farm manager: [employer]  . Financial resource strain: Not hard at all  . Food insecurity:    Worry: Never true    Inability: Never true  . Transportation needs:    Medical: No    Non-medical: No  Tobacco Use  . Smoking status: Never Smoker  . Smokeless tobacco: Never Used  Substance and Sexual Activity  . Alcohol use: Yes     Comment: rare  . Drug use: No  . Sexual activity: Yes    Birth control/protection: Post-menopausal  Lifestyle  . Physical activity:    Days per week: 7 days    Minutes per session: 40 min  . Stress: Not at all  Relationships  . Social connections:    Talks on phone: More than three times a week    Gets together: More than three times a week    Attends religious service: More than 4 times per year    Active member of club or organization: No    Attends meetings of clubs or organizations: Never    Relationship status: Married  . Intimate partner violence:    Fear of current or ex partner: No    Emotionally abused: No    Physically abused: No    Forced sexual activity: No  Other Topics Concern  . Not on file  Social History Narrative  . Not on file   Family History  Problem Relation Age of Onset  . Colon cancer Brother   . Cancer Brother   . Lung disease Sister   . Hypothyroidism Sister   . Cancer Brother     Objective: Office vital signs reviewed. BP 138/88   Pulse 80   Temp (!) 97.1 F (36.2 C) (Oral)   Ht 5' 4.5" (1.638 m)   Wt 187 lb (84.8 kg)   BMI 31.60 kg/m   Physical Examination:  General: Awake, alert, well nourished, No acute distress HEENT: Normal, sclera white, no exophthalmos Cardio: regular rate and rhythm, S1S2 heard, no murmurs appreciated Pulm: clear to auscultation bilaterally, no wheezes, rhonchi or rales; normal work of breathing on room air Extremities: warm, well perfused, No edema, cyanosis or clubbing; +2 pulses bilaterally Skin: normal temperature Neuro: no tremor, no focal neurologic deficits. AOx3 Psych: mood depressed. Affect appropriate. Does not appear to be responding to internal stimuli. Depression screen [facility] 2/9 06/03/2018 12/11/2017 12/10/2017 07/15/2017 05/30/2017  Decreased Interest 1 0 0 0 0  Down, Depressed, Hopeless 0 0 0 0 0  PHQ - 2 Score 1 0 0 0 0  Altered sleeping 0 - - - -  Tired, decreased energy 0 - - - -  Change in  appetite 0 - - - -  Feeling bad or failure about yourself  0 - - - -  Trouble concentrating 0 - - - -  Moving slowly or fidgety/restless 0 - - - -  Suicidal thoughts 0 - - - -  PHQ-9 Score 1 - - - -  Difficult doing work/chores - - - - -   GAD 7 : Generalized Anxiety Score 06/03/2018 05/20/2017 05/20/2017  Nervous, Anxious, on Edge _0 Control/stop worrying _1 Worry too much - different things _2 Trouble relaxing 1 2 1  Restless 0 2 0  Easily annoyed or irritable 0 0 2  Afraid - awful might happen 0 0 2  Total GAD 7 Score _0 Anxiety Difficulty - - Very difficult   Assessment/ Plan: 73 y.o. female   1. Postoperative hypothyroidism Some depression noted but unsure if this is related to her hypothyroidism.  Check thyroid panel - Thyroid Panel With TSH - CMP14+EGFR  2. Vitamin D deficiency Possibly related to thyroidectomy.  Check vitamin D level - VITAMIN D 25 Hydroxy (Vit-D Deficiency, Fractures)  3. Depression, recurrent ([place]) Not controlled.  Some anxiety as well.  I have placed referral to counseling services and have changed her Wellbutrin to 150 mg XL.  We may need to consider SSRI instead given some anxiety symptoms.  We will wait for recheck to determine if this is needed - Referral to Chronic Care Management Services  4. Stress due to illness of family member As above - Referral to Chronic Care Management Services  5. Migraine without status migrainosus, not intractable, unspecified migraine type We discussed the risks of Imitrex use.  We discussed various alternative options for preventative measures including beta-blocker, TCA.  We reviewed the increased cardiovascular risk with Imitrex. Patient voiced good understanding and wants to proceed with Imitrex.   - SUMAtriptan (IMITREX) 25 MG tablet; Take 1 tablet (25 mg total) by mouth once for 1 dose. May repeat ONCE in 2 hours if headache persists or recurs.  Dispense: 10 tablet; Refill: 0   Orders  Placed This Encounter  Procedures  . Thyroid Panel With TSH  . CMP14+EGFR  . VITAMIN D 25 Hydroxy (Vit-D Deficiency, Fractures)   Meds ordered this encounter  Medications  . buPROPion (WELLBUTRIN XL) 150 MG 24 hr tablet    Sig: Take 1 tablet (150 mg total) by mouth daily.    Dispense:  30 tablet    Refill:  1  . SUMAtriptan (IMITREX) 25 MG tablet    Sig: Take 1 tablet (25 mg total) by mouth once for 1 dose. May repeat ONCE in 2 hours if headache persists or recurs.    Dispense:  10 tablet    Refill:  0     Jannae Fagerstrom Windell Moulding, DO [place] [phone]

## 2018-06-04 LAB — CMP14+EGFR
A/G RATIO: 1.9 (ref 1.2–2.2)
ALK PHOS: 85 IU/L (ref 39–117)
ALT: 19 IU/L (ref 0–32)
AST: 20 IU/L (ref 0–40)
Albumin: 4.6 g/dL (ref 3.7–4.7)
BUN/Creatinine Ratio: 21 (ref 12–28)
BUN: 16 mg/dL (ref 8–27)
Bilirubin Total: 0.3 mg/dL (ref 0.0–1.2)
CALCIUM: 9.8 mg/dL (ref 8.7–10.3)
CO2: 21 mmol/L (ref 20–29)
CREATININE: 0.78 mg/dL (ref 0.57–1.00)
Chloride: 100 mmol/L (ref 96–106)
GFR calc Af Amer: 88 mL/min/{1.73_m2} (ref >59)
GFR, EST NON AFRICAN AMERICAN: 77 mL/min/{1.73_m2} (ref >59)
Globulin, Total: 2.4 g/dL (ref 1.5–4.5)
Glucose: 93 mg/dL (ref 65–99)
POTASSIUM: 4.6 mmol/L (ref 3.5–5.2)
Sodium: 136 mmol/L (ref 134–144)
Total Protein: 7 g/dL (ref 6.0–8.5)

## 2018-06-04 LAB — VITAMIN D 25 HYDROXY (VIT D DEFICIENCY, FRACTURES): VIT D 25 HYDROXY: 30 ng/mL (ref 30.0–100.0)

## 2018-06-04 LAB — THYROID PANEL WITH TSH
FREE THYROXINE INDEX: 2.1 (ref 1.2–4.9)
T3 Uptake Ratio: 27 % (ref 24–39)
T4 TOTAL: 7.7 ug/dL (ref 4.5–12.0)
TSH: 5.33 u[IU]/mL — AB (ref 0.450–4.500)

## 2018-06-13 ENCOUNTER — Ambulatory Visit (INDEPENDENT_AMBULATORY_CARE_PROVIDER_SITE_OTHER): Payer: Medicare Other | Admitting: Licensed Clinical Social Worker

## 2018-06-13 DIAGNOSIS — F339 Major depressive disorder, recurrent, unspecified: Secondary | ICD-10-CM

## 2018-06-13 DIAGNOSIS — E559 Vitamin D deficiency, unspecified: Secondary | ICD-10-CM

## 2018-06-13 DIAGNOSIS — E890 Postprocedural hypothyroidism: Secondary | ICD-10-CM

## 2018-06-13 DIAGNOSIS — F419 Anxiety disorder, unspecified: Secondary | ICD-10-CM

## 2018-06-13 DIAGNOSIS — Z6379 Other stressful life events affecting family and household: Secondary | ICD-10-CM

## 2018-06-13 NOTE — Patient Instructions (Signed)
Licensed Clinical Social Worker Visit Information  Goals we discussed today:  Goals Addressed            This Visit's Progress   . Client said:  " I need to talk with someone about stress, anxiety related to family members' health issues" (pt-stated)       Current Barriers:  [name] Mental Health Concerns   . Family members health issues of concern  Clinical Social Work Clinical Goal(s):  [name] Over the next 30 days, client will work with [name] to address concerns related to stress concerning health needs of family members  Interventions: . Patient interviewed and appropriate assessments performed . Provided patient with information about CCM program services  . [name] talked with client about her past history with counselor support . [name] provided counseling support for client . [name] talked with client about relaxation techniques of choice for client  Patient Self Care Activities:  . Self administers medications as prescribed . Attends all scheduled provider appointments . Attends church or other social activities   Plan:  Client to call [name] as needed to discuss mental health needs of client Client to attend scheduled medical appointments Client to use relaxation techniques of choice to help manage client stress/anxiety symptoms [name] to call client in next 3 weeks to talk with client about stress management for client  Initial goal documentation    Materials provided: No  Ms. Moffet was given information about Chronic Care Management services today including:  1. CCM service includes personalized support from designated clinical staff supervised by her physician, including individualized plan of care and coordination with other care providers 2. 24/7 contact phone numbers for assistance for urgent and routine care needs. 3. Service will only be billed when office clinical staff spend 20 minutes or more in a month to coordinate care. 4. Only one practitioner may furnish and bill the  service in a calendar month. 5. The patient may stop CCM services at any time (effective at the end of the month) by phone call to the office staff. 6. The patient will be responsible for cost sharing (co-pay) of up to 20% of the service fee (after annual deductible is met).  Patient agreed to services and verbal consent obtained.   Follow Up Plan:    [name] to call client in next 3 weeks to talk with client about stress management for client  The patient verbalized understanding of instructions provided today and declined a print copy of patient instruction materials.   [name].Elisha Cooksey MSW, [name] Licensed Clinical Social Worker [facility] Care Management [phone]

## 2018-06-13 NOTE — Chronic Care Management (AMB) (Signed)
Care Management Note   Amber Rivas is a 73 y.o. year old female who is a primary care patient of [name], DO. The CM team was consulted for assistance with chronic disease management and care coordination.   I reached out to [employer] by phone today.   Amber Rivas was given information about Chronic Care Management services today including:  1. CCM service includes personalized support from designated clinical staff supervised by her physician, including individualized plan of care and coordination with other care providers 2. 24/7 contact phone numbers for assistance for urgent and routine care needs. 3. Service will only be billed when office clinical staff spend 20 minutes or more in a month to coordinate care. 4. Only one practitioner may furnish and bill the service in a calendar month. 5. The patient may stop CCM services at any time (effective at the end of the month) by phone call to the office staff. 6. The patient will be responsible for cost sharing (co-pay) of up to 20% of the service fee (after annual deductible is met). Patient agreed to services and verbal consent obtained.    Review of patient status, including review of consultants reports, relevant laboratory and other test results, and collaboration with appropriate care team members and the patient's provider was performed as part of comprehensive patient evaluation and provision of chronic care management services.   Social Determinants of Health: At Risk for Depression  GAD 7 : Generalized Anxiety Score 06/13/2018 06/03/2018 05/20/2017 05/20/2017  Nervous, Anxious, on Edge '2 3 2 3  ' Control/stop worrying '2 1 3 2  ' Worry too much - different things '2 1 3 2  ' Trouble relaxing '1 1 2 1  ' Restless 1 0 2 0  Easily annoyed or irritable 0 0 0 2  Afraid - awful might happen 0 0 0 2  Total GAD 7 Score '8 6 12 12  ' Anxiety Difficulty Very difficult - - Very difficult   Depression screen [facility] 2/9 06/13/2018 06/03/2018  12/11/2017  Decreased Interest 1 1 0  Down, Depressed, Hopeless 1 0 0  PHQ - 2 Score 2 1 0  Altered sleeping 0 0 -  Tired, decreased energy 1 0 -  Change in appetite 0 0 -  Feeling bad or failure about yourself  0 0 -  Trouble concentrating 1 0 -  Moving slowly or fidgety/restless 0 0 -  Suicidal thoughts 0 0 -  PHQ-9 Score 4 1 -  Difficult doing work/chores Somewhat difficult - -   Goals Addressed            This Visit's Progress   . Client said:  " I need to talk with someone about stress, anxiety related to family members' health issues" (pt-stated)       Current Barriers:  Amber Rivas Mental Health Concerns   . Family members health issues of concern  Clinical Social Work Clinical Goal(s):  Amber Rivas Over the next 30 days, client will work with [name] to address concerns related to stress concerning health needs of family members  Interventions: . Patient interviewed and appropriate assessments performed . Provided patient with information about CCM program services  . [name] talked with client about her past history with counselor support . [name] provided counseling support for client . [name] talked with client about relaxation techniques of choice for client  Patient Self Care Activities:  . Self administers medications as prescribed . Attends all scheduled provider appointments . Attends church or other social  activities   Plan: Client to call [name] as needed to discuss mental health needs of client Client to attend scheduled medical appointments Client to use relaxation techniques of choice to help manage client stress/anxiety symptoms [name] to call client in next 3 weeks to talk with client about stress management for client  Initial goal documentation    Follow Up Plan: [name] to call client in next 3 weeks to talk with client about stress management related to family health needs.  Amber Rivas.Amber Rivas MSW, [name] Licensed Clinical Social Worker [facility]  Care Management [phone]

## 2018-06-29 ENCOUNTER — Other Ambulatory Visit: Payer: Self-pay | Admitting: Endocrinology

## 2018-06-29 NOTE — Telephone Encounter (Signed)
Options: Ask PCP to refill, or: Please refill x 1, and Ov is due

## 2018-06-30 ENCOUNTER — Other Ambulatory Visit: Payer: Self-pay | Admitting: Family Medicine

## 2018-07-01 ENCOUNTER — Ambulatory Visit (INDEPENDENT_AMBULATORY_CARE_PROVIDER_SITE_OTHER): Payer: Medicare Other | Admitting: Licensed Clinical Social Worker

## 2018-07-01 DIAGNOSIS — E559 Vitamin D deficiency, unspecified: Secondary | ICD-10-CM

## 2018-07-01 DIAGNOSIS — E039 Hypothyroidism, unspecified: Secondary | ICD-10-CM | POA: Diagnosis not present

## 2018-07-01 DIAGNOSIS — F339 Major depressive disorder, recurrent, unspecified: Secondary | ICD-10-CM

## 2018-07-01 DIAGNOSIS — Z6379 Other stressful life events affecting family and household: Secondary | ICD-10-CM

## 2018-07-01 DIAGNOSIS — F419 Anxiety disorder, unspecified: Secondary | ICD-10-CM

## 2018-07-01 NOTE — Chronic Care Management (AMB) (Signed)
  Care Management Note   Amber Rivas is a 73 y.o. year old female who is a primary care patient of [name], DO. The CM team was consulted for assistance with chronic disease management and care coordination.   I reached out to [employer] by phone today.   Review of patient status, including review of consultants reports, relevant laboratory and other test results, and collaboration with appropriate care team members and the patient's provider was performed as part of comprehensive patient evaluation and provision of chronic care management services.   Social Determinants of Health: At Risk for Depression  GAD 7 : Generalized Anxiety Score 06/13/2018 06/03/2018 05/20/2017 05/20/2017  Nervous, Anxious, on Edge 2 3 2 3   Control/stop worrying 2 1 3 2   Worry too much - different things 2 1 3 2   Trouble relaxing 1 1 2 1   Restless 1 0 2 0  Easily annoyed or irritable 0 0 0 2  Afraid - awful might happen 0 0 0 2  Total GAD 7 Score 8 6 12 12   Anxiety Difficulty Very difficult - - Very difficult      Chronic Care Management from 06/13/2018 in [place]  PHQ-9 Total Score  4      Goals Addressed            This Visit's Progress   . Client said:  " I need to talk with someone about stress, anxiety related to family members' health issues" (pt-stated)       Current Barriers:  [name] Mental Health Concerns   . Family members health issues of concern (brother of client has been under Hospice care)  Clinical Social Work Clinical Goal(s):  [name] Over the next 30 days, client will work with [name] to address concerns related to stress concerning health needs of family members  Interventions: . [name] provided counseling support for client . [name] talked with client about relaxation techniques of choice for client (gardening, reading, photography, spending time with grandchildren) . Talked with client about health issues/needs of brother of client.  Patient Self  Care Activities:  . Self administers medications as prescribed . Attends all scheduled provider appointments . Attends church or other social activities   Plan:  Client to call [name] as needed to discuss mental health needs of client Client to attend scheduled medical appointments Client to use relaxation techniques of choice to help manage client stress/anxiety symptoms [name] to call client in next 3 weeks to talk with client about stress management for client  Initial goal documentation    Follow Up Plan: [name] to call client in next 3 weeks to talk with client about stress management for client  [name].Joab Carden MSW, [name] Licensed Clinical Social Worker [facility] Care Management [phone]

## 2018-07-01 NOTE — Patient Instructions (Signed)
Licensed Clinical Social Worker Visit Information  Goals we discussed today:  Goals Addressed            This Visit's Progress   . Client said:  " I need to talk with someone about stress, anxiety related to family members' health issues" (pt-stated)       Current Barriers:  [name] Mental Health Concerns   . Family members health issues of concern  Clinical Social Work Clinical Goal(s):  [name] Over the next 30 days, client will work with [name] to address concerns related to stress concerning health needs of family members  Interventions: . [name] talked with client about her past history with counselor support . [name] provided counseling support for client . [name] talked with client about relaxation techniques of choice for client  Patient Self Care Activities:  . Self administers medications as prescribed . Attends all scheduled provider appointments . Attends church or other social activities   Plan:  Client to call [name] as needed to discuss mental health needs of client Client to attend scheduled medical appointments Client to use relaxation techniques of choice to help manage client stress/anxiety symptoms [name] to call client in next 3 weeks to talk with client about stress management for client  Initial goal documentation    Materials Provided: No  Follow Up Plan: [name] to call client in next 3 weeks to talk with client about stress management for client  The patient verbalized understanding of instructions provided today and declined a print copy of patient instruction materials.   [name].Ilsa Bonello MSW, [name] Licensed Clinical Social Worker [facility] Care [facility] [phone]

## 2018-07-15 ENCOUNTER — Ambulatory Visit (INDEPENDENT_AMBULATORY_CARE_PROVIDER_SITE_OTHER): Payer: Medicare Other | Admitting: Family Medicine

## 2018-07-15 ENCOUNTER — Other Ambulatory Visit: Payer: Self-pay

## 2018-07-15 DIAGNOSIS — F4329 Adjustment disorder with other symptoms: Secondary | ICD-10-CM

## 2018-07-15 DIAGNOSIS — F329 Major depressive disorder, single episode, unspecified: Secondary | ICD-10-CM | POA: Diagnosis not present

## 2018-07-15 DIAGNOSIS — G43809 Other migraine, not intractable, without status migrainosus: Secondary | ICD-10-CM | POA: Diagnosis not present

## 2018-07-15 NOTE — Progress Notes (Signed)
Telephone visit  Subjective: CC: depression/ stress PCP: [name], DO JSH:[id number] Amber Rivas is a 73 y.o. female calls for telephone consult today. Patient provides verbal consent for consult held via phone.  Location of patient: At her brother's residence Location of provider: Working remotely from home Others present for call: Her brother but he is not participating in the call  1.  Depression/stress Patient reports that she continues to have some stress and depressive symptoms but they seem to be related to her brother's ongoing decline in health.  Since we last spoke, hospice care has not been coming to the home to Care of her brother because of the COVID-19 outbreak.  She and her siblings have taken over total end-of-life care for her brother, which has been somewhat difficult and fatiguing.  She notes that over the weekend she "crashed" and slept quite a bit.  She has not started the increased dose of Wellbutrin that was prescribed at her last visit because when she went to retrieve her medications from the pharmacy she was instead given her old dose of Wellbutrin 75 mg.  She has been taking this twice daily.  She does feel like she has good support from her family and feels that her counseling sessions with Nicki Reaper, clinical social worker, are very helpful.  2.  Migraine headaches Since her last visit, patient reports that she has had 2 migraine headaches the last being yesterday.  These were both refractory to OTC medications but were relieved by the low-dose Imitrex that was prescribed at her last visit.  She notes total resolution of migraine headache after the medication.   ROS: Per HPI  Allergies  Allergen Reactions  . Sulfur Anaphylaxis   Past Medical History:  Diagnosis Date  . Arthritis    foot  . Headache    Migraines  . History of hiatal hernia    slight noted on imaging  . Hyperthyroidism   . Pneumonia    history of    Current Outpatient Medications:  .   acetaminophen (TYLENOL) 500 MG tablet, Take 500 mg by mouth every 6 (six) hours as needed for mild pain or moderate pain., Disp: , Rfl:  .  aspirin EC 81 MG tablet, Take 81 mg by mouth daily as needed for mild pain or moderate pain. , Disp: , Rfl:  .  aspirin-acetaminophen-caffeine (EXCEDRIN MIGRAINE) 250-250-65 MG tablet, Take 1 tablet by mouth every 6 (six) hours as needed for headache. , Disp: , Rfl:  .  B-COMPLEX-C PO, Take 1 tablet by mouth daily., Disp: , Rfl:  .  buPROPion (WELLBUTRIN XL) 150 MG 24 hr tablet, Take 1 tablet (150 mg total) by mouth daily., Disp: 30 tablet, Rfl: 1 .  levothyroxine (SYNTHROID, LEVOTHROID) 88 MCG tablet, TAKE 1 TABLET BY MOUTH EVERY DAY BEFORE BREAKFAST, Disp: 30 tablet, Rfl: 1 .  Multiple Vitamin (MULTIVITAMIN WITH MINERALS) TABS tablet, Take 1 tablet by mouth daily., Disp: , Rfl:  .  Omega-3 Fatty Acids (OMEGA 3 PO), Take 1 capsule by mouth daily. , Disp: , Rfl:  .  SUMAtriptan (IMITREX) 25 MG tablet, Take 1 tablet (25 mg total) by mouth once for 1 dose. May repeat ONCE in 2 hours if headache persists or recurs., Disp: 10 tablet, Rfl: 0 .  TURMERIC PO, Take 1 tablet by mouth daily., Disp: , Rfl:   Assessment/ Plan: 73 y.o. female   1. Stress and adjustment reaction Has not yet started the increased dose of Wellbutrin.  We will  reevaluate in about 1 to 2 months to determine if she needs titration of medication or change altogether of medication.  At this time, I do think that her depressive symptoms are certainly impacted by her brother's failing health and the emotional/physical fatigue that she is experiencing with regards to providing ongoing care.  I am happy to see that she is finding the counseling sessions with Scott helpful and I have instructed her to continue these.  We will plan to reconvene as above  2. Depressive reaction As above  3. Other migraine without status migrainosus, not intractable Responsive to Imitrex 25 mg daily.  Patient is aware  of the risk of use of this medication but seems to be using it very sparingly.  May need to consider alternative therapies if she finds herself using it more frequently versus needing higher doses.   Start time: 10:25a End time: 10:36am  Total time spent on patient care (including telephone call/ virtual visit): 107mins  Vaniah Chambers M Desani Sprung, [place] [phone]

## 2018-07-22 ENCOUNTER — Ambulatory Visit: Payer: Self-pay | Admitting: Licensed Clinical Social Worker

## 2018-07-22 DIAGNOSIS — E559 Vitamin D deficiency, unspecified: Secondary | ICD-10-CM

## 2018-07-22 DIAGNOSIS — F419 Anxiety disorder, unspecified: Secondary | ICD-10-CM

## 2018-07-22 DIAGNOSIS — E039 Hypothyroidism, unspecified: Secondary | ICD-10-CM

## 2018-07-22 DIAGNOSIS — F339 Major depressive disorder, recurrent, unspecified: Secondary | ICD-10-CM

## 2018-07-22 DIAGNOSIS — Z6379 Other stressful life events affecting family and household: Secondary | ICD-10-CM

## 2018-07-22 NOTE — Patient Instructions (Signed)
Licensed Clinical Social Worker Visit Information  Goals we discussed today:  Goals Addressed            This Visit's Progress   . Client said:  " I need to talk with someone about stress, anxiety related to family members' health issues" (pt-stated)       Current Barriers:  [name] Mental Health Concerns   . Family members health issues of concern  Clinical Social Work Clinical Goal(s):  [name] Over the next 30 days, client will work with [name] to address concerns related to stress concerning health needs of family members  Interventions: . [name] talked with client about self care activities for client (eating meals on regular schedule, getting enough sleep at night) . [name] talked with client about relaxation techniques of choice for client (watching TV, walking outdoors)  Patient Self Care Activities:  . Self administers medications as prescribed . Attends all scheduled provider appointments . Attends church or other social activities   Plan:  Client to call [name] as needed to discuss mental health needs of client Client to attend scheduled medical appointments Client to use relaxation techniques of choice to help manage client stress/anxiety symptoms [name] to call client in next 3 weeks to talk with client about stress management for client  Initial goal documentation     Materials Provided: No  Follow Up Plan: [name] to call client in next 3 weeks to talk with client about stress management for client  The patient verbalized understanding of instructions provided today and declined a print copy of patient instruction materials.   [name].Malyiah Fellows MSW, [name] Licensed Clinical Social Worker [facility] Care Management [phone]

## 2018-07-22 NOTE — Chronic Care Management (AMB) (Signed)
  Care Management Note   Amber Rivas is a 73 y.o. year old female who is a primary care patient of Amber Norlander, DO. The CM team was consulted for assistance with chronic disease management and care coordination.   I reached out to [employer] by phone today.   Review of patient status, including review of consultants reports, relevant laboratory and other test results, and collaboration with appropriate care team members and the patient's provider was performed as part of comprehensive patient evaluation and provision of chronic care management services.   Social Determinants of Health:Risk for Depression     Chronic Care Management from 06/13/2018 in [place]  PHQ-9 Total Score  4     Goals Addressed            This Visit's Progress   . Client said:  " I need to talk with someone about stress, anxiety related to family members' health issues" (pt-stated)       Current Barriers:  [name] Mental Health Concerns   . Family members health issues of concern  Clinical Social Work Clinical Goal(s):  [name] Over the next 30 days, client will work with [name] to address concerns related to stress concerning health needs of family members  Interventions: . Client and [name] spoke of client self care (eating meals on regular schedule, getting needed sleep at night) . [name] talked with client about relaxation techniques of choice for client (likes to watch TV, walk outdoors)  Patient Self Care Activities:  . Self administers medications as prescribed . Attends all scheduled provider appointments . Attends church or other social activities   Plan: Client to call [name] as needed to discuss mental health needs of client Client to attend scheduled medical appointments Client to use relaxation techniques of choice to help manage client stress/anxiety symptoms [name] to call client in next 3 weeks to talk with client about stress management for client  Initial goal  documentation    Client has said that her brother has medical needs at present.  She is spending time daily with her brother trying to provide needed support.  She has her prescribed medications. She is eating well.  Follow Up Plan: [name] to call client in next 3 weeks to talk with client about stress management for client.  Amber Rivas MSW, [name] Licensed Clinical Social Worker [facility] Care [facility] [phone]

## 2018-08-12 ENCOUNTER — Ambulatory Visit: Payer: Self-pay | Admitting: Licensed Clinical Social Worker

## 2018-08-12 DIAGNOSIS — F339 Major depressive disorder, recurrent, unspecified: Secondary | ICD-10-CM

## 2018-08-12 DIAGNOSIS — Z6379 Other stressful life events affecting family and household: Secondary | ICD-10-CM

## 2018-08-12 DIAGNOSIS — E039 Hypothyroidism, unspecified: Secondary | ICD-10-CM

## 2018-08-12 DIAGNOSIS — F419 Anxiety disorder, unspecified: Secondary | ICD-10-CM

## 2018-08-12 DIAGNOSIS — E559 Vitamin D deficiency, unspecified: Secondary | ICD-10-CM

## 2018-08-12 NOTE — Patient Instructions (Signed)
Licensed Clinical Social Worker Visit Information  Goals we discussed today:  Goals Addressed            This Visit's Progress   . Client said:  " I need to talk with someone about stress, anxiety related to family members' health issues" (pt-stated)       Current Barriers:  [name] Mental Health Concerns   . Family members health issues of concern  Clinical Social Work Clinical Goal(s):  [name] Over the next 30 days, client will work with [name] to address concerns related to stress concerning health needs of family members  Interventions: . [name] talked with client about relaxation techniques of choice for client . Talked with client about health needs of family members (brother) . Talked with client about family support for her brother  Patient Self Care Activities:  . Self administers medications as prescribed . Attends all scheduled provider appointments . Attends church or other social activities   Plan:  Client to call [name] as needed to discuss mental health needs of client Client to attend scheduled medical appointments Client to use relaxation techniques of choice to help manage client stress/anxiety symptoms [name] to call client in next 3 weeks to talk with client about stress management for client  Initial goal documentation      Materials Provided: No  Follow Up Plan: [name] to call client in next 3 weeks to talk with client about stress management of client  The patient verbalized understanding of instructions provided today and declined a print copy of patient instruction materials.   [name].Joene Gelder MSW, [name] Licensed Clinical Social Worker [facility] Care [facility] [phone]

## 2018-08-12 NOTE — Chronic Care Management (AMB) (Signed)
  Care Management Note   Amber Rivas is a 73 y.o. year old female who is a primary care patient of [name], DO. The CM team was consulted for assistance with chronic disease management and care coordination.   I reached out to [employer] by phone today.   Review of patient status, including review of consultants reports, relevant laboratory and other test results, and collaboration with appropriate care team members and the patient's provider was performed as part of comprehensive patient evaluation and provision of chronic care management services.   Social Determinants of Health:Risk for Depression    Chronic Care Management from 06/13/2018 in [place]  PHQ-9 Total Score  4     Goals Addressed            This Visit's Progress   . Client said:  " I need to talk with someone about stress, anxiety related to family members' health issues" (pt-stated)       Current Barriers:  [name] Mental Health Concerns   . Family members health issues of concern  Clinical Social Work Clinical Goal(s):  [name] Over the next 30 days, client will work with [name] to address concerns related to stress concerning health needs of family members  Interventions: [name] talked with client about health needs of her brother [name] talked with client about family care support for her brother   Patient Self Care Activities:  . Self administers medications as prescribed . Attends all scheduled provider appointments . Attends church or other social activities   Plan:  Client to call [name] as needed to discuss mental health needs of client Client to attend scheduled medical appointments Client to use relaxation techniques of choice to help manage client stress/anxiety symptoms [name] to call client in next 3 weeks to talk with client about stress management for client  Initial goal documentation    Follow Up Plan: [name] to call client in next 3 weeks to talk with client about  stress management of client  [name].Bane Hagy MSW, [name] Licensed Clinical Social Worker [facility] Care [facility] [phone]

## 2018-08-13 ENCOUNTER — Other Ambulatory Visit: Payer: Self-pay | Admitting: Pediatrics

## 2018-08-13 DIAGNOSIS — F339 Major depressive disorder, recurrent, unspecified: Secondary | ICD-10-CM

## 2018-08-15 ENCOUNTER — Other Ambulatory Visit: Payer: Self-pay

## 2018-08-19 ENCOUNTER — Other Ambulatory Visit: Payer: Medicare Other

## 2018-08-19 ENCOUNTER — Ambulatory Visit (INDEPENDENT_AMBULATORY_CARE_PROVIDER_SITE_OTHER): Payer: Medicare Other | Admitting: Licensed Clinical Social Worker

## 2018-08-19 DIAGNOSIS — E559 Vitamin D deficiency, unspecified: Secondary | ICD-10-CM

## 2018-08-19 DIAGNOSIS — E039 Hypothyroidism, unspecified: Secondary | ICD-10-CM | POA: Diagnosis not present

## 2018-08-19 DIAGNOSIS — F419 Anxiety disorder, unspecified: Secondary | ICD-10-CM

## 2018-08-19 DIAGNOSIS — F339 Major depressive disorder, recurrent, unspecified: Secondary | ICD-10-CM

## 2018-08-19 DIAGNOSIS — Z6379 Other stressful life events affecting family and household: Secondary | ICD-10-CM

## 2018-08-19 NOTE — Chronic Care Management (AMB) (Signed)
  Care Management Note   Amber Rivas is a 73 y.o. year old female who is a primary care patient of [name], DO. The CM team was consulted for assistance with chronic disease management and care coordination.   I reached out to [employer] by phone today.   Review of patient status, including review of consultants reports, relevant laboratory and other test results, and collaboration with appropriate care team members and the patient's provider was performed as part of comprehensive patient evaluation and provision of chronic care management services.   Social Determinants of Health:Risk for Depression    Chronic Care Management from 06/13/2018 in [place]  PHQ-9 Total Score  4     GAD 7 : Generalized Anxiety Score 06/13/2018 06/03/2018 05/20/2017 05/20/2017  Nervous, Anxious, on Edge 2 3 2 3   Control/stop worrying 2 1 3 2   Worry too much - different things 2 1 3 2   Trouble relaxing 1 1 2 1   Restless 1 0 2 0  Easily annoyed or irritable 0 0 0 2  Afraid - awful might happen 0 0 0 2  Total GAD 7 Score 8 6 12 12   Anxiety Difficulty Very difficult - - Very difficult    Goals Addressed            This Visit's Progress   . Client said:  " I need to talk with someone about stress, anxiety related to family members' health issues" (pt-stated)       Current Barriers:  [name] Mental Health Concerns   . Family members health issues of concern  Clinical Social Work Clinical Goal(s):  [name] Over the next 30 days, client will work with [name] to address concerns related to stress concerning health needs of family members  Interventions: . Talked with client about health needs of family member (brother who is receiving end of life care)  Patient Self Care Activities:  . Self administers medications as prescribed . Attends all scheduled provider appointments   Plan:  Client to call [name] as needed to discuss mental health needs of client Client to attend  scheduled medical appointments Client to use relaxation techniques of choice to help manage client stress/anxiety symptoms [name] to call client in next 3 weeks to talk with client about stress management for client  Initial goal documentation        Client has said that her brother has medical needs at present.  She is spending time daily with her brother trying to provide needed support. She and her siblings have been providing care as needed for her brother who has end of life care needs.  She has her prescribed medications. She is eating well. [name] has talked with client about self care activities of choice, such as getting adequate rest daily, eating meals on normal schedule, or spending time participating in relaxation activities of client's choice.  During recent [name] calls with client, client has seemed more stressed and more pressed for time during these calls.   Follow Up Plan: [name] to call client in next 3 weeks to talk with client about stress management of client  [name].Marivel Mcclarty MSW, [name] Licensed Clinical Social Worker [facility] [phone]

## 2018-08-19 NOTE — Patient Instructions (Signed)
Licensed Clinical Social Worker Visit Information  Goals we discussed today:  Goals Addressed            This Visit's Progress   . Client said:  " I need to talk with someone about stress, anxiety related to family members' health issues" (pt-stated)       Current Barriers:  [name] Mental Health Concerns   . Family members health issues of concern  Clinical Social Work Clinical Goal(s):  [name] Over the next 30 days, client will work with [name] to address concerns related to stress concerning health needs of family members  Interventions: . Talked with client about health needs of family member (brother who is receiving end of life care)  Patient Self Care Activities:  . Self administers medications as prescribed . Attends all scheduled provider appointments   Plan:  Client to call [name] as needed to discuss mental health needs of client Client to attend scheduled medical appointments Client to use relaxation techniques of choice to help manage client stress/anxiety symptoms [name] to call client in next 3 weeks to talk with client about stress management for client  Initial goal documentation      Materials Provided: No  Follow Up Plan: [name] to call client in next 3 weeks to talk with client about stress management for client  The patient verbalized understanding of instructions provided today and declined a print copy of patient instruction materials.    [name].Bettyjean Stefanski MSW, [name] Licensed Clinical Social Worker [facility] [phone]

## 2018-09-02 ENCOUNTER — Other Ambulatory Visit: Payer: Self-pay | Admitting: Family Medicine

## 2018-09-02 NOTE — Telephone Encounter (Signed)
[facility]. NTBS. 06/03/18 labwork to rck in 6wks. Note from pharmacy, they filled last on 08/13/18 #30

## 2018-09-03 NOTE — Telephone Encounter (Signed)
Patient has scheduled an appointment for further refills.

## 2018-09-04 ENCOUNTER — Other Ambulatory Visit: Payer: Self-pay

## 2018-09-05 ENCOUNTER — Other Ambulatory Visit: Payer: Medicare Other

## 2018-09-05 ENCOUNTER — Ambulatory Visit: Payer: Medicare Other | Admitting: Family Medicine

## 2018-09-05 VITALS — BP 138/87 | HR 80 | Ht 64.5 in | Wt 192.0 lb

## 2018-09-05 DIAGNOSIS — E890 Postprocedural hypothyroidism: Secondary | ICD-10-CM

## 2018-09-05 DIAGNOSIS — F4329 Adjustment disorder with other symptoms: Secondary | ICD-10-CM | POA: Diagnosis not present

## 2018-09-05 DIAGNOSIS — F329 Major depressive disorder, single episode, unspecified: Secondary | ICD-10-CM

## 2018-09-05 DIAGNOSIS — F32A Depression, unspecified: Secondary | ICD-10-CM | POA: Insufficient documentation

## 2018-09-05 MED ORDER — BUPROPION HCL 75 MG PO TABS: 75.0000 mg | ORAL_TABLET | Freq: Two times a day (BID) | ORAL | 3 refills | Status: DC

## 2018-09-05 NOTE — Progress Notes (Signed)
Subjective: CC: f/u depression/ stress PCP: [name], DO Amber Rivas is a 73 y.o. female presenting to clinic today for:  1.  Depressive disorder/stress Patient was evaluated last in April.  At that time, she had not yet started the increased dose of Wellbutrin but was having quite a bit of depressive symptoms mostly related to her brother's failing health and the emotional physical fatigue she was experiencing with relation to this.  She has been seeing Scott intermittently for counseling sessions but as of late, most of the phone calls have been deferred because she is often helping with her brother.  They now have somebody helping 4 nights per week which is helping her get a little more time at home and with sleep.  She has been eating more and eating things that are less nutritious because that is "what is available".  She seen that this is affected her weight.  Moreover, she felt better on the 75 mg dose twice daily than the 150 XL's and wishes to go back to those tablets.  2. Hypothyroidism, post surgical Patient is compliant with Synthroid 88 mcg daily.  Her last TSH demonstrated slightly elevated TSH.  Plans were to continue with current dose and recheck.  She reports weight gain as above.  No change in stool pattern, difficulty swallowing.  ROS: Per HPI  Allergies  Allergen Reactions  . Sulfur Anaphylaxis   Past Medical History:  Diagnosis Date  . Arthritis    foot  . Headache    Migraines  . History of hiatal hernia    slight noted on imaging  . Hyperthyroidism   . Pneumonia    history of    Current Outpatient Medications:  .  acetaminophen (TYLENOL) 500 MG tablet, Take 500 mg by mouth every 6 (six) hours as needed for mild pain or moderate pain., Disp: , Rfl:  .  aspirin EC 81 MG tablet, Take 81 mg by mouth daily as needed for mild pain or moderate pain. , Disp: , Rfl:  .  aspirin-acetaminophen-caffeine (EXCEDRIN MIGRAINE) 250-250-65 MG tablet, Take 1  tablet by mouth every 6 (six) hours as needed for headache. , Disp: , Rfl:  .  B-COMPLEX-C PO, Take 1 tablet by mouth daily., Disp: , Rfl:  .  buPROPion (WELLBUTRIN XL) 150 MG 24 hr tablet, Take 1 tablet (150 mg total) by mouth daily., Disp: 30 tablet, Rfl: 1 .  levothyroxine (SYNTHROID, LEVOTHROID) 88 MCG tablet, TAKE 1 TABLET BY MOUTH EVERY DAY BEFORE BREAKFAST, Disp: 30 tablet, Rfl: 1 .  Multiple Vitamin (MULTIVITAMIN WITH MINERALS) TABS tablet, Take 1 tablet by mouth daily., Disp: , Rfl:  .  Omega-3 Fatty Acids (OMEGA 3 PO), Take 1 capsule by mouth daily. , Disp: , Rfl:  .  SUMAtriptan (IMITREX) 25 MG tablet, Take 1 tablet (25 mg total) by mouth once for 1 dose. May repeat ONCE in 2 hours if headache persists or recurs., Disp: 10 tablet, Rfl: 0 Social History   Socioeconomic History  . Marital status: Married    Spouse name: [name]  . Number of children: 4  . Years of education: Bachelors Degree  . Highest education level: Bachelor's degree (e.g., BA, AB, BS)  Occupational History  . Occupation: Retired    Fish farm manager: [employer]  . Financial resource strain: Not hard at all  . Food insecurity    Worry: Never true    Inability: Never true  . Transportation needs  Medical: No    Non-medical: No  Tobacco Use  . Smoking status: Never Smoker  . Smokeless tobacco: Never Used  Substance and Sexual Activity  . Alcohol use: Yes    Comment: rare  . Drug use: No  . Sexual activity: Yes    Birth control/protection: Post-menopausal  Lifestyle  . Physical activity    Days per week: 7 days    Minutes per session: 40 min  . Stress: Not at all  Relationships  . Social connections    Talks on phone: More than three times a week    Gets together: More than three times a week    Attends religious service: More than 4 times per year    Active member of club or organization: No    Attends meetings of clubs or organizations: Never    Relationship status:  Married  . Intimate partner violence    Fear of current or ex partner: No    Emotionally abused: No    Physically abused: No    Forced sexual activity: No  Other Topics Concern  . Not on file  Social History Narrative  . Not on file   Family History  Problem Relation Age of Onset  . Colon cancer Brother   . Cancer Brother   . Lung disease Sister   . Hypothyroidism Sister   . Cancer Brother     Objective: Office vital signs reviewed. BP 138/87   Pulse 80   Ht 5' 4.5" (1.638 m)   Wt 192 lb (87.1 kg)   BMI 32.45 kg/m   Physical Examination:  General: Awake, alert, well nourished, No acute distress HEENT: Normal    Neck: No masses palpated.  Well-healed scar noted along the base of the anterior neck.  Thyroid surgically absent    Eyes: PERRLA, extraocular membranes intact, sclera white.  No exophthalmos Cardio: regular rate and rhythm, S1S2 heard, no murmurs appreciated Pulm: clear to auscultation bilaterally, no wheezes, rhonchi or rales; normal work of breathing on room air Extremities: warm, well perfused, No edema, cyanosis or clubbing; +2 pulses bilaterally MSK: normal gait and station Skin: dry; intact; no rashes or lesions; normal temperature Neuro: No tremor Psych: Mood stable.  Speech normal. Depression screen [facility] 2/9 09/05/2018 06/13/2018 06/03/2018  Decreased Interest 0 1 1  Down, Depressed, Hopeless 0 1 0  PHQ - 2 Score 0 2 1  Altered sleeping 3 0 0  Tired, decreased energy 3 1 0  Change in appetite 1 0 0  Feeling bad or failure about yourself  0 0 0  Trouble concentrating 0 1 0  Moving slowly or fidgety/restless 0 0 0  Suicidal thoughts 0 0 0  PHQ-9 Score 7 4 1   Difficult doing work/chores Somewhat difficult Somewhat difficult -   GAD 7 : Generalized Anxiety Score 09/05/2018 06/13/2018 06/03/2018 05/20/2017  Nervous, Anxious, on Edge 3 2 3 2   Control/stop worrying 0 2 1 3   Worry too much - different things 0 2 1 3   Trouble relaxing 0 1 1 2   Restless 0 1 0  2  Easily annoyed or irritable 0 0 0 0  Afraid - awful might happen 0 0 0 0  Total GAD 7 Score 3 8 6 12   Anxiety Difficulty - Very difficult - -    Assessment/ Plan: 73 y.o. female   1. Postoperative hypothyroidism Some weight gain but may be related to diet.  Check thyroid panel given abnormal TSH.  May need to increase  dose to 100 mcg daily - Thyroid Panel With TSH  2. Stress and adjustment reaction I have switched her back to the 75 mg twice daily.  She will continue to follow-up with Scott.  See me back in 3 months or sooner if needed - buPROPion (WELLBUTRIN) 75 MG tablet; Take 1 tablet (75 mg total) by mouth 2 (two) times daily.  Dispense: 60 tablet; Refill: 3  3. Depressive disorder - buPROPion (WELLBUTRIN) 75 MG tablet; Take 1 tablet (75 mg total) by mouth 2 (two) times daily.  Dispense: 60 tablet; Refill: 3   No orders of the defined types were placed in this encounter.  No orders of the defined types were placed in this encounter.    [name], DO [place] [phone]

## 2018-09-05 NOTE — Addendum Note (Signed)
Addended by: [name] on: 09/05/2018 10:02 AM   Modules accepted: Orders

## 2018-09-05 NOTE — Patient Instructions (Signed)
You had labs performed today.  You will be contacted with the results of the labs once they are available, usually in the next 3 business days for routine lab work.  If you had a pap smear or biopsy performed, expect to be contacted in about 7-10 days.  

## 2018-09-06 LAB — THYROID PANEL WITH TSH
Free Thyroxine Index: 2 (ref 1.2–4.9)
T3 Uptake Ratio: 26 % (ref 24–39)
T4, Total: 7.5 ug/dL (ref 4.5–12.0)
TSH: 6.57 u[IU]/mL — ABNORMAL HIGH (ref 0.450–4.500)

## 2018-09-08 ENCOUNTER — Other Ambulatory Visit: Payer: Self-pay | Admitting: Family Medicine

## 2018-09-08 DIAGNOSIS — E890 Postprocedural hypothyroidism: Secondary | ICD-10-CM

## 2018-09-08 MED ORDER — LEVOTHYROXINE SODIUM 100 MCG PO TABS: 100.0000 ug | ORAL_TABLET | Freq: Every day | ORAL | 0 refills | Status: DC

## 2018-09-09 ENCOUNTER — Telehealth: Payer: Self-pay

## 2018-09-10 ENCOUNTER — Ambulatory Visit (INDEPENDENT_AMBULATORY_CARE_PROVIDER_SITE_OTHER): Payer: Medicare Other | Admitting: Licensed Clinical Social Worker

## 2018-09-10 DIAGNOSIS — E559 Vitamin D deficiency, unspecified: Secondary | ICD-10-CM

## 2018-09-10 DIAGNOSIS — Z6379 Other stressful life events affecting family and household: Secondary | ICD-10-CM

## 2018-09-10 DIAGNOSIS — F339 Major depressive disorder, recurrent, unspecified: Secondary | ICD-10-CM

## 2018-09-10 DIAGNOSIS — E039 Hypothyroidism, unspecified: Secondary | ICD-10-CM | POA: Diagnosis not present

## 2018-09-10 DIAGNOSIS — F419 Anxiety disorder, unspecified: Secondary | ICD-10-CM

## 2018-09-10 NOTE — Chronic Care Management (AMB) (Signed)
Chronic Care Management    Clinical Social Work CCM Outreach Note  09/10/2018 Name: Amber Rivas MRN: [id number] DOB: 1945-05-09  Amber Rivas is a 73 y.o. year old female who is a primary care patient of [name], DO . The CCM team was consulted for assistance with assessment of psychosocial needs.   [name] reached out to [employer] today by phone.    Review of patient status, including review of consultants reports, relevant laboratory and other test results, and collaboration with appropriate care team members and the patient's provider was performed as part of comprehensive patient evaluation and provision of chronic care management services.   Social Determinants of Health; Risk for Depression      PHQ-9 Total Score  4     GAD 7 : Generalized Anxiety Score 06/13/2018 06/03/2018 05/20/2017 05/20/2017  Nervous, Anxious, on Edge 2 3 2 3   Control/stop worrying 2 1 3 2   Worry too much - different things 2 1 3 2   Trouble relaxing 1 1 2 1   Restless 1 0 2 0  Easily annoyed or irritable 0 0 0 2  Afraid - awful might happen 0 0 0 2  Total GAD 7 Score 8 6 12 12   Anxiety Difficulty Very difficult - - Very difficult               Goals Addressed                       This Visit's Progress    . Client said:  " I need to talk with someone about stress, anxiety related to family members' health issues" (pt-stated)        Current Barriers:   Mental Health Concerns    Family members health issues of concern  Clinical Social Work Clinical Goal(s):   Over the next 30 days, client will work with [name] to address concerns related to stress concerning health needs of family members  Interventions:  Talked with client about health needs of family member (brother who is receiving end of life care)  Talked with client about self care activities of choice  Talked with client about relaxation techniques to help client manage symptoms faced.    Patient  Self Care Activities:   Self administers medications as prescribed  Attends all scheduled provider appointments   Plan:  Client to call [name] as needed to discuss mental health needs of client Client to attend scheduled medical appointments Client to use relaxation techniques of choice to help manage client stress/anxiety symptoms [name] to call client in next 3 weeks to talk with client about stress management for client  Initial goal documentation        Client has said that her brother has medical needs at present. She is spending time daily with her brother trying to provide needed support. Client has her prescribed medications. She is eating well. [name] has talked with client about self care activities of choice, such as getting adequate rest daily, eating meals on normal schedule, or spending time participating in relaxation activities of client's choice.  During recent [name] calls with client, client has seemed more stressed and more pressed for time during these calls. [name] continues to encourage client to use self care activities of choice.   Follow Up Plan: [name] to call client in next 3 weeks to talk with client about stress management of client  [name].Deshane Cotroneo MSW, [name] Licensed Holiday representative [facility] Care Management [phone]

## 2018-09-10 NOTE — Patient Instructions (Addendum)
Licensed Clinical Social Worker Visit Information  Goals we discussed today:  Goals    . Client said:  " I need to talk with someone about stress, anxiety related to family members' health issues" (pt-stated)     Current Barriers:  [name] Mental Health Concerns   . Family members health issues of concern  Clinical Social Work Clinical Goal(s):  [name] Over the next 30 days, client will work with [name] to address concerns related to stress concerning health needs of family members  Interventions: . Talked with client about self care activities for client . [name] talked with client about relaxation techniques of choice for client . Talked with client about health needs of family members  Patient Self Care Activities:  . Self administers medications as prescribed . Attends all scheduled provider appointments   Plan:  Client to call [name] as needed to discuss mental health needs of client Client to attend scheduled medical appointments Client to use relaxation techniques of choice to help manage client stress/anxiety symptoms [name] to call client in next 3 weeks to talk with client about stress management for client  Initial goal documentation    .     Materials Provided: No  Follow Up Plan: [name] to call client in next 3 weeks to talk with client about stress management for client   The patient verbalized understanding of instructions provided today and declined a print copy of patient instruction materials.   [name].Zamyia Gowell MSW, [name] Licensed Clinical Social Worker [facility] Care [facility] [phone]

## 2018-10-01 ENCOUNTER — Ambulatory Visit (INDEPENDENT_AMBULATORY_CARE_PROVIDER_SITE_OTHER): Payer: Medicare Other | Admitting: Licensed Clinical Social Worker

## 2018-10-01 DIAGNOSIS — F419 Anxiety disorder, unspecified: Secondary | ICD-10-CM

## 2018-10-01 DIAGNOSIS — E559 Vitamin D deficiency, unspecified: Secondary | ICD-10-CM

## 2018-10-01 DIAGNOSIS — Z6379 Other stressful life events affecting family and household: Secondary | ICD-10-CM

## 2018-10-01 DIAGNOSIS — F339 Major depressive disorder, recurrent, unspecified: Secondary | ICD-10-CM | POA: Diagnosis not present

## 2018-10-01 DIAGNOSIS — E039 Hypothyroidism, unspecified: Secondary | ICD-10-CM | POA: Diagnosis not present

## 2018-10-01 NOTE — Chronic Care Management (AMB) (Signed)
  Care Management Note   Amber Rivas is a 73 y.o. year old female who is a primary care patient of Amber Norlander, DO. The CM team was consulted for assistance with chronic disease management and care coordination.   I reached out to Amber Rivas  by phone today.   Review of patient status, including review of consultants reports, relevant laboratory and other test results, and collaboration with appropriate care team members and the patient's provider was performed as part of comprehensive patient evaluation and provision of chronic care management services.   Social Determinants of Health: Risk for Depression; risk for occasional social isolation    Chronic Care Management from 09/10/2018 in [place]  PHQ-9 Total Score  4     GAD 7 : Generalized Anxiety Score 09/05/2018 06/13/2018 06/03/2018 05/20/2017  Nervous, Anxious, on Edge 3 2 3 2   Control/stop worrying 0 2 1 3   Worry too much - different things 0 2 1 3   Trouble relaxing 0 1 1 2   Restless 0 1 0 2  Easily annoyed or irritable 0 0 0 0  Afraid - awful might happen 0 0 0 0  Total GAD 7 Score 3 8 6 12   Anxiety Difficulty - Very difficult - -    Goals    . Client said:  " I need to talk with someone about stress, anxiety related to family members' health issues" (pt-stated)     Current Barriers:  Amber Rivas Kitchen Mental Health Concerns   . Family members health issues of concern  Clinical Social Work Clinical Goal(s):  Amber Rivas Kitchen Over the next 30 days, client will work with Amber Rivas to address concerns related to stress concerning health needs of family members  Interventions:   Previously talked with client about health needs of family member(brother who is receiving end of life care)  Previously talked with client about self care activities of choice  Previously talked with client about relaxation techniques to help client manage symptoms faced.   Patient Self Care Activities:  . Self  administers medications as prescribed . Attends all scheduled provider appointments   Plan:  Client to call Amber Rivas as needed to discuss mental health needs of client Client to attend scheduled medical appointments Client to use relaxation techniques of choice to help manage client stress/anxiety symptoms Amber Rivas to call client in next 3 weeks to talk with client about stress management for client  Initial goal documentation    Client is helping to provide support for her brother who is receiving end of life care. Client has medications and is taking medications as prescribed. Client has some support from her spouse, Amber Rivas.  Amber Rivas has encouraged client to use self care activities of choice. Amber Rivas has encouraged Amber Rivas to call Amber Rivas for social work support. Amber Rivas has encouraged Amber Rivas to call [facility] for CCM nursing support.  Follow Up Plan: Amber Rivas to call client in next 3 weeks to talk with client about stress management for client  Amber Rivas.Amber Rivas MSW, Amber Rivas Licensed Clinical Social Worker [facility] [phone]

## 2018-10-01 NOTE — Patient Instructions (Signed)
Licensed Clinical Social Worker Visit Information  Goals we discussed today:   Goals    . Client said:  " I need to talk with someone about stress, anxiety related to family members' health issues" (pt-stated)     Current Barriers:  [name] Mental Health Concerns   . Family members health issues of concern  Clinical Social Work Clinical Goal(s):  [name] Over the next 30 days, client will work with [name] to address concerns related to stress concerning health needs of family members  Interventions: . Previously encouraged client to use self care activities of choice . Previously talked with client about relaxation techniques of choice for client . Previously talked with client about health needs of family members  Patient Self Care Activities:  . Self administers medications as prescribed . Attends all scheduled provider appointments   Plan:  Client to call [name] as needed to discuss mental health needs of client Client to attend scheduled medical appointments Client to use relaxation techniques of choice to help manage client stress/anxiety symptoms [name] to call client in next 3 weeks to talk with client about stress management for client  Initial goal documentation   Materials Provided: No  Follow Up Plan:  [name] to call client in next 3 weeks to talk with client about stress management  for client.  The patient [place] verbalized understanding of instructions provided today and declined a print copy of patient instruction materials.   [name].Maili Shutters MSW, [name] Licensed Clinical Social Worker [facility] [phone]

## 2018-10-21 DIAGNOSIS — M76829 Posterior tibial tendinitis, unspecified leg: Secondary | ICD-10-CM | POA: Insufficient documentation

## 2018-10-22 ENCOUNTER — Ambulatory Visit: Payer: Self-pay | Admitting: Licensed Clinical Social Worker

## 2018-10-22 DIAGNOSIS — E559 Vitamin D deficiency, unspecified: Secondary | ICD-10-CM

## 2018-10-22 DIAGNOSIS — Z6379 Other stressful life events affecting family and household: Secondary | ICD-10-CM

## 2018-10-22 DIAGNOSIS — F419 Anxiety disorder, unspecified: Secondary | ICD-10-CM

## 2018-10-22 DIAGNOSIS — E039 Hypothyroidism, unspecified: Secondary | ICD-10-CM

## 2018-10-22 DIAGNOSIS — F339 Major depressive disorder, recurrent, unspecified: Secondary | ICD-10-CM

## 2018-10-22 NOTE — Patient Instructions (Addendum)
Licensed Clinical Social Worker Visit Information  Goals we discussed today:  Goals    . Client said:  " I need to talk with someone about stress, anxiety related to family members' health issues" (pt-stated)     Current Barriers:  [name] Mental Health Concerns   . Family members health issues of concern  Clinical Social Work Clinical Goal(s):  [name] Over the next 30 days, client will work with [name] to address concerns related to stress concerning health needs of family members  Intervention:   . Previously [name] talked with client about relaxation techniques of choice for client . Previously talked with client about health needs of family members . Previously encouraged client to use self care activities of choice  Patient Self Care Activities:  . Self administers medications as prescribed . Attends all scheduled provider appointments   Plan:  Client to call [name] as needed to discuss mental health needs of client Client to attend scheduled medical appointments Client to use relaxation techniques of choice to help manage client stress/anxiety symptoms [name] to call client in next 3 weeks to talk with client about stress management for client  Initial goal documentation    .     Materials Provided:  No  Follow Up Plan: [name] to call client in next 3 weeks to talk with client about stress  management for client  The patient/spouse Kaiyana Bedore verbalized understanding of instructions provided today and declined a print copy of patient instruction materials.   [name].Sheryle Vice MSW, [name] Licensed Clinical Social Worker [facility] Care [facility] [phone]

## 2018-10-22 NOTE — Chronic Care Management (AMB) (Signed)
  Care Management Note   Amber Rivas is a 73 y.o. year old female who is a primary care patient of [name], DO. The CM team was consulted for assistance with chronic disease management and care coordination.   I reached out to [place] by phone today.   Review of patient status, including review of consultants reports, relevant laboratory and other test results, and collaboration with appropriate care team members and the patient's provider was performed as part of comprehensive patient evaluation and provision of chronic care management services.   Social Determinants of Health: risk for social isolation; risk for depression    Chronic Care Management from 09/10/2018 in [place]  PHQ-9 Total Score  4     GAD 7 : Generalized Anxiety Score 09/05/2018 06/13/2018 06/03/2018 05/20/2017  Nervous, Anxious, on Edge 3 2 3 2   Control/stop worrying 0 2 1 3   Worry too much - different things 0 2 1 3   Trouble relaxing 0 1 1 2   Restless 0 1 0 2  Easily annoyed or irritable 0 0 0 0  Afraid - awful might happen 0 0 0 0  Total GAD 7 Score 3 8 6 12   Anxiety Difficulty - Very difficult - -   Goals    . Client said:  " I need to talk with someone about stress, anxiety related to family members' health issues" (pt-stated)     Current Barriers:  [name] Mental Health Concerns   . Family members health issues of concern  Clinical Social Work Clinical Goal(s):  [name] Over the next 30 days, client will work with [name] to address concerns related to stress concerning health needs of family members  Interventions: . Previously [name] talked with client about relaxation techniques of choice for client . Previously talked with client about health needs of family members . Previously encouraged client to use self care activities of choice  Patient Self Care Activities:  . Self administers medications as prescribed . Attends all scheduled provider  appointments   Plan:  Client to call [name] as needed to discuss mental health needs of client Client to attend scheduled medical appointments Client to use relaxation techniques of choice to help manage client stress/anxiety symptoms [name] to call client in next 3 weeks to talk with client about stress management for client  Initial goal documentation  Client is continuing to provide some support for her brother who is receiving end of life care support. Client has prescribed medications and is taking medications as prescribed. [name] has encouraged Suzannah to call [name] as needed to discuss social work needs of client.    Follow Up Plan: [name] to call client in next 3 weeks to talk with client about stress management for client  [name].Beckham Capistran MSW, [name] Licensed Clinical Social Worker [facility] Care Management [phone]

## 2018-11-12 ENCOUNTER — Ambulatory Visit (INDEPENDENT_AMBULATORY_CARE_PROVIDER_SITE_OTHER): Payer: Medicare Other | Admitting: Licensed Clinical Social Worker

## 2018-11-12 DIAGNOSIS — F339 Major depressive disorder, recurrent, unspecified: Secondary | ICD-10-CM

## 2018-11-12 DIAGNOSIS — E559 Vitamin D deficiency, unspecified: Secondary | ICD-10-CM

## 2018-11-12 DIAGNOSIS — F419 Anxiety disorder, unspecified: Secondary | ICD-10-CM

## 2018-11-12 DIAGNOSIS — E039 Hypothyroidism, unspecified: Secondary | ICD-10-CM | POA: Diagnosis not present

## 2018-11-12 DIAGNOSIS — Z6379 Other stressful life events affecting family and household: Secondary | ICD-10-CM

## 2018-11-12 NOTE — Patient Instructions (Addendum)
Licensed Clinical Social Worker Visit Information  Goals we discussed today:  Goals    . Client said:  " I need to talk with someone about stress, anxiety related to family members' health issues" (pt-stated)     Current Barriers:  [name] Mental Health Concerns   . Family members health issues of concern   Clinical Social Work Clinical Goal(s):  [name] Over the next 30 days, client will work with [name] to address concerns related to stress concerning health needs of family members  Interventions: . [name] talked with Amber Rivas, spouse of client, about social work needs of Amber Rivas . [name] previously talked with client about relaxation techniques of choice for client . Previously talked with client about health needs of family members  Patient Self Care Activities:  . Self administers medications as prescribed . Attends all scheduled provider appointments    Plan:   Client to call [name] as needed to discuss mental health needs of client Client to attend scheduled medical appointments Client to use relaxation techniques of choice to help manage client stress/anxiety symptoms [name] to call client in next 3 weeks to talk with client about stress management for client  Initial goal documentation     Materials Provided: No   Follow Up Plan: [name] to call client in next 3 weeks to talk with client about stress management for client  The patient/spouse Amber Rivas verbalized understanding of instructions provided today and declined a print copy of patient instruction materials.   Amber Rivas.Amber Rivas MSW, [name] Licensed Clinical Social Worker [facility] Care Management [phone]

## 2018-11-12 NOTE — Chronic Care Management (AMB) (Signed)
Care Management Note   Amber Rivas is a 73 y.o. year old female who is a primary care patient of [name], DO. The CM team was consulted for assistance with chronic disease management and care coordination.   I reached out to [place] by phone today.   Review of patient status, including review of consultants reports, relevant laboratory and other test results, and collaboration with appropriate care team members and the patient's provider was performed as part of comprehensive patient evaluation and provision of chronic care management services.   Social Determinants of Health: risk of social isolation; risk of depression    Chronic Care Management from 09/10/2018 in [place]  PHQ-9 Total Score  4     GAD 7 : Generalized Anxiety Score 09/05/2018 06/13/2018 06/03/2018 05/20/2017  Nervous, Anxious, on Edge 3 2 3 2   Control/stop worrying 0 2 1 3   Worry too much - different things 0 2 1 3   Trouble relaxing 0 1 1 2   Restless 0 1 0 2  Easily annoyed or irritable 0 0 0 0  Afraid - awful might happen 0 0 0 0  Total GAD 7 Score 3 8 6 12   Anxiety Difficulty - Very difficult - -   Current Outpatient Medications:  .  acetaminophen (TYLENOL) 500 MG tablet, Take 500 mg by mouth every 6 (six) hours as needed for mild pain or moderate pain., Disp: , Rfl:  .  aspirin EC 81 MG tablet, Take 81 mg by mouth daily as needed for mild pain or moderate pain. , Disp: , Rfl:  .  aspirin-acetaminophen-caffeine (EXCEDRIN MIGRAINE) 250-250-65 MG tablet, Take 1 tablet by mouth every 6 (six) hours as needed for headache. , Disp: , Rfl:  .  B-COMPLEX-C PO, Take 1 tablet by mouth daily., Disp: , Rfl:  .  buPROPion (WELLBUTRIN XL) 150 MG 24 hr tablet, Take 1 tablet (150 mg total) by mouth daily., Disp: 30 tablet, Rfl: 1 .  levothyroxine (SYNTHROID, LEVOTHROID) 88 MCG tablet, TAKE 1 TABLET BY MOUTH EVERY DAY BEFORE BREAKFAST, Disp: 30 tablet, Rfl: 1 .   Multiple Vitamin (MULTIVITAMIN WITH MINERALS) TABS tablet, Take 1 tablet by mouth daily., Disp: , Rfl:  .  Omega-3 Fatty Acids (OMEGA 3 PO), Take 1 capsule by mouth daily. , Disp: , Rfl:  .  SUMAtriptan (IMITREX) 25 MG tablet, Take 1 tablet (25 mg total) by mouth once for 1 dose. May repeat ONCE in 2 hours if headache persists or recurs., Disp: 10 tablet, Rfl: 0    Goals    . Client said:  " I need to talk with someone about stress, anxiety related to family members' health issues" (pt-stated)     Current Barriers:  [name] Mental Health Concerns   . Family members health issues of concern  Clinical Social Work Clinical Goal(s):  [name] Over the next 30 days, client will work with [name] to address concerns related to stress concerning health needs of family members  Interventions:  [name] has previously talked with client about relaxation techniques of choice for client (gardening, reading, photography, spending time with grandchildren)  Previously talked with client about health issues/needs of brother of client.  Talked with Amber Rivas, spouse of client, about current social work needs of Amber Rivas  Patient Self Care Activities:  . Self administers medications as prescribed . Attends all scheduled provider appointments   Plan:  Client to call [name] as needed to discuss mental health  needs of client Client to attend scheduled medical appointments Client to use relaxation techniques of choice to help manage client stress/anxiety symptoms [name] to call client in next 3 weeks to talk with client about stress management for client  Initial goal documentation    .    Amber Rivas., spouse of client, reported to [name] today that brother of client died last 07-04-2022. Amber Rivas also report that Amber Rivas  has been having issues with her foot. She is scheduled to see surgeon tomorrow for appointment.  Amber Rivas said client is having some difficulty walking at present.  Client has prescribed  medications and is taking medications as scheduled. Amber Rivas said client also has another brother has has health issues at present.   Amber Rivas said client was eating adequately.  [name] has previously talked with Amber Rivas about relaxation techniques of choice for Amber Rivas. [name] thanked Amber Rivas for phone call with [name] on 11/12/2018.  [name] encouraged that Amber Rivas call [name] as needed to discuss social work needs of client  Follow Up Plan: [name] to call client in next 3 weeks to talk with client about stress management for client  [name].Paiden Caraveo MSW, [name] Licensed Clinical Social Worker [facility] Care [facility] [phone]

## 2018-12-04 ENCOUNTER — Ambulatory Visit (INDEPENDENT_AMBULATORY_CARE_PROVIDER_SITE_OTHER): Payer: Medicare Other | Admitting: Licensed Clinical Social Worker

## 2018-12-04 DIAGNOSIS — Z6379 Other stressful life events affecting family and household: Secondary | ICD-10-CM

## 2018-12-04 DIAGNOSIS — F339 Major depressive disorder, recurrent, unspecified: Secondary | ICD-10-CM | POA: Diagnosis not present

## 2018-12-04 DIAGNOSIS — F419 Anxiety disorder, unspecified: Secondary | ICD-10-CM

## 2018-12-04 DIAGNOSIS — E039 Hypothyroidism, unspecified: Secondary | ICD-10-CM | POA: Diagnosis not present

## 2018-12-04 DIAGNOSIS — E559 Vitamin D deficiency, unspecified: Secondary | ICD-10-CM

## 2018-12-04 NOTE — Patient Instructions (Signed)
Licensed Clinical Social Worker Visit Information  Goals we discussed today:  Goals    . Client said:  " I need to talk with someone about stress, anxiety related to family members' health issues" (pt-stated)     Current Barriers:  [name] Mental Health Concerns   . Family members health issues of concern  Clinical Social Work Clinical Goal(s):  [name] Over the next 30 days, client will work with [name] to address concerns related to stress concerning health needs of family members  Interventions: . Previously, [name] talked with client about relaxation techniques of choice for client . Previously talked with client about health needs of family members . Talked with Hubbard Robinson, spouse of client , about social work needs of client . Talked with Hubbard Robinson, spouse of client, about recent deaths of two of client's brothers.   Patient Self Care Activities:  . Self administers medications as prescribed . Attends all scheduled provider appointments   Plan:  Client to call [name] as needed to discuss mental health needs of client Client to attend scheduled medical appointments Client to use relaxation techniques of choice to help manage client stress/anxiety symptoms [name] to call client in next 3 weeks to talk with client about stress management for client  Initial goal documentation        Materials Provided: No  Follow Up Plan:  [name] to call client in next 3 weeks to talk with client about stress management for client    The patient/spouse, Safiyyah Buehrle verbalized understanding of instructions provided today and declined a print copy of patient instruction materials.   [name].Tiffay Pinette MSW, [name] Licensed Clinical Social Worker [facility] Care [facility] [phone]

## 2018-12-04 NOTE — Chronic Care Management (AMB) (Signed)
Care Management Note   Amber Rivas is a 73 y.o. year old female who is a primary care patient of Amber Norlander, DO. The CM team was consulted for assistance with chronic disease management and care coordination.   I reached out to Amber Rivas by phone today.     Review of patient status, including review of consultants reports, relevant laboratory and other test results, and collaboration with appropriate care team members and the patient's provider was performed as part of comprehensive patient evaluation and provision of chronic care management services.   Social Determinants of Health: risk of social isolation; risk of depression    Chronic Care Management from 09/10/2018 in [place]  PHQ-9 Total Score  4     GAD 7 : Generalized Anxiety Score 09/05/2018 06/13/2018 06/03/2018 05/20/2017  Nervous, Anxious, on Edge 3 2 3 2   Control/stop worrying 0 2 1 3   Worry too much - different things 0 2 1 3   Trouble relaxing 0 1 1 2   Restless 0 1 0 2  Easily annoyed or irritable 0 0 0 0  Afraid - awful might happen 0 0 0 0  Total GAD 7 Score 3 8 6 12   Anxiety Difficulty - Very difficult - -    Medications   New medications from outside sources are available for reconciliation   acetaminophen (TYLENOL) 500 MG tablet    aspirin EC 81 MG tablet    aspirin-acetaminophen-caffeine (EXCEDRIN MIGRAINE) 250-250-65 MG tablet    B-COMPLEX-C PO    buPROPion (WELLBUTRIN) 75 MG tablet    levothyroxine (SYNTHROID) 100 MCG tablet    Multiple Vitamin (MULTIVITAMIN WITH MINERALS) TABS tablet    Omega-3 Fatty Acids (OMEGA 3 PO)    SUMAtriptan (IMITREX) 25 MG tablet(Expired)     Goals    . Client said:  " I need to talk with someone about stress, anxiety related to family members' health issues" (pt-stated)     Current Barriers:  [name] Mental Health Concerns   . Family members health issues of concern  Clinical Social Work Clinical Goal(s):  [name] Over  the next 30 days, client will work with [name] to address concerns related to stress concerning health needs of family members  Interventions: . [name] talked with Amber Rivas, spouse of client about client social work needs . [name] previously talked with client about relaxation techniques of choice for client . Previously talked with client about health needs of family members . Talked with Amber Rivas, spouse of client about recent deaths of 2 of client's brothers  Patient Self Care Activities:  . Self administers medications as prescribed . Attends all scheduled provider appointments   Plan:  Client to call [name] as needed to discuss mental health needs of client Client to attend scheduled medical appointments Client to use relaxation techniques of choice to help manage client stress/anxiety symptoms [name] to call client in next 3 weeks to talk with client about stress management for client  Initial goal documentation   Atonya Mcglamery reported to [name] that client was taking a brief vacation at the beach. . Client has prescribed medications and is taking medications as prescribed. Ciria Kerner reported that client had two brothers who passed away recently. Client is sleeping adequately. Client is eating adequately . [name] said client will return from her trip on this Sunday. [name] has encouraged client to call [name] as needed to discuss social work needs of client  Follow Up Plan:  [name] to call client in next 3 weeks to talk with client about stress management for client  [name].Erina Hamme MSW, [name] Licensed Clinical Social Worker [facility] Care [facility] [phone]

## 2018-12-08 ENCOUNTER — Other Ambulatory Visit: Payer: Self-pay | Admitting: Family Medicine

## 2018-12-08 DIAGNOSIS — E890 Postprocedural hypothyroidism: Secondary | ICD-10-CM

## 2018-12-29 ENCOUNTER — Ambulatory Visit (INDEPENDENT_AMBULATORY_CARE_PROVIDER_SITE_OTHER): Payer: Medicare Other | Admitting: Licensed Clinical Social Worker

## 2018-12-29 DIAGNOSIS — F339 Major depressive disorder, recurrent, unspecified: Secondary | ICD-10-CM

## 2018-12-29 DIAGNOSIS — E039 Hypothyroidism, unspecified: Secondary | ICD-10-CM

## 2018-12-29 DIAGNOSIS — Z6379 Other stressful life events affecting family and household: Secondary | ICD-10-CM

## 2018-12-29 DIAGNOSIS — F419 Anxiety disorder, unspecified: Secondary | ICD-10-CM

## 2018-12-29 DIAGNOSIS — F4329 Adjustment disorder with other symptoms: Secondary | ICD-10-CM

## 2018-12-29 NOTE — Patient Instructions (Signed)
Licensed Clinical Social Worker Visit Information  Goals we discussed today:  Goals    . Client said:  " I need to talk with someone about stress, anxiety related to family members' health issues" (pt-stated)     Current Barriers:  [name] Mental Health Concerns   . Family members health issues of concern  Clinical Social Work Clinical Goal(s):  [name] Over the next 30 days, client will work with [name] to address concerns related to stress concerning health needs of family members  Interventions: . [name] provided counseling support for client . [name] previously talked with client about relaxation techniques of choice for client . Previously talked with client about health needs of family members . Talked with client about the deaths of 2 of her brothers  Patient Self Care Activities:  . Self administers medications as prescribed . Attends all scheduled provider appointments   Plan:  Client to call [name] as needed to discuss mental health needs of client Client to attend scheduled medical appointments Client to use relaxation techniques of choice to help manage client stress/anxiety symptoms [name] to call client in next 3 weeks to talk with client about stress management for client  Initial goal documentation     Materials Provided: No  Follow Up Plan:  [name] to call client in next 3 weeks to talk with client about stress management for client  The patient verbalized understanding of instructions provided today and declined a print copy of patient instruction materials.   [name].Kuper Rennels MSW, [name] Licensed Clinical Social Worker [facility] [phone]

## 2018-12-29 NOTE — Chronic Care Management (AMB) (Signed)
  Care Management Note   Amber Rivas is a 73 y.o. year old female who is a primary care patient of [name], DO. The CM team was consulted for assistance with chronic disease management and care coordination.   I reached out to [employer] by phone today.     Review of patient status, including review of consultants reports, relevant laboratory and other test results, and collaboration with appropriate care team members and the patient's provider was performed as part of comprehensive patient evaluation and provision of chronic care management services.   Social Determinants of Health: risk of depression; risk of social isolation    Chronic Care Management from 09/10/2018 in [place]  PHQ-9 Total Score  4     GAD 7 : Generalized Anxiety Score 09/05/2018 06/13/2018 06/03/2018 05/20/2017  Nervous, Anxious, on Edge 3 2 3 2   Control/stop worrying 0 2 1 3   Worry too much - different things 0 2 1 3   Trouble relaxing 0 1 1 2   Restless 0 1 0 2  Easily annoyed or irritable 0 0 0 0  Afraid - awful might happen 0 0 0 0  Total GAD 7 Score 3 8 6 12   Anxiety Difficulty - Very difficult - -   Medications   New medications from outside sources are available for reconciliation   acetaminophen (TYLENOL) 500 MG tablet    aspirin EC 81 MG tablet    aspirin-acetaminophen-caffeine (EXCEDRIN MIGRAINE) 250-250-65 MG tablet    B-COMPLEX-C PO    buPROPion (WELLBUTRIN) 75 MG tablet    levothyroxine (SYNTHROID) 100 MCG tablet    Multiple Vitamin (MULTIVITAMIN WITH MINERALS) TABS tablet    Omega-3 Fatty Acids (OMEGA 3 PO)    SUMAtriptan (IMITREX) 25 MG tablet(Expired)     Goals    . Client said:  " I need to talk with someone about stress, anxiety related to family members' health issues" (pt-stated)      Current Barriers:  [name] Mental Health Concerns   . Family members health issues of concern  Clinical Social Work Clinical Goal(s):  [name] Over the next 30 days,  client will work with [name] to address concerns related to stress concerning health needs of family members  Interventions: . [name] previously talked with client about relaxation techniques of choice for client  . Previously talked with client about health needs of family members   .  Previously talked with Hubbard Robinson, spouse of client, about needs of client   Provided counseling support for client  Talked with client about deaths of her 2 brothers  Patient Self Care Activities:  . Self administers medications as prescribed . Attends all scheduled provider appointments   Plan:  Client to call [name] as needed to discuss mental health needs of client Client to attend scheduled medical appointments Client to use relaxation techniques of choice to help manage client stress/anxiety symptoms [name] to call client in next 3 weeks to talk with client about stress management for client  Initial goal documentation    Follow Up Plan: [name] to call client in next 3 weeks to talk with client about stress management of client  [name].[place] Sorn MSW, [name] Licensed Clinical Social Worker [facility] [phone]

## 2019-01-07 ENCOUNTER — Other Ambulatory Visit: Payer: Self-pay | Admitting: *Deleted

## 2019-01-07 DIAGNOSIS — F329 Major depressive disorder, single episode, unspecified: Secondary | ICD-10-CM

## 2019-01-07 DIAGNOSIS — F4329 Adjustment disorder with other symptoms: Secondary | ICD-10-CM

## 2019-01-07 DIAGNOSIS — F32A Depression, unspecified: Secondary | ICD-10-CM

## 2019-01-07 MED ORDER — BUPROPION HCL 75 MG PO TABS: 75.0000 mg | ORAL_TABLET | Freq: Two times a day (BID) | ORAL | 0 refills | Status: DC

## 2019-01-11 ENCOUNTER — Other Ambulatory Visit: Payer: Self-pay | Admitting: Family Medicine

## 2019-01-11 DIAGNOSIS — E890 Postprocedural hypothyroidism: Secondary | ICD-10-CM

## 2019-01-12 ENCOUNTER — Other Ambulatory Visit: Payer: Self-pay | Admitting: Family Medicine

## 2019-01-12 DIAGNOSIS — E890 Postprocedural hypothyroidism: Secondary | ICD-10-CM

## 2019-01-12 MED ORDER — LEVOTHYROXINE SODIUM 100 MCG PO TABS: 100.0000 ug | ORAL_TABLET | Freq: Every day | ORAL | 0 refills | Status: DC

## 2019-01-12 NOTE — Telephone Encounter (Signed)
Patient aware that refill sent but she must come in for repeat labs.

## 2019-01-12 NOTE — Telephone Encounter (Signed)
[facility]. NTBS for 3 mos recheck labs 30 days given 12/08/18

## 2019-01-13 ENCOUNTER — Telehealth: Payer: Self-pay | Admitting: Family Medicine

## 2019-01-13 NOTE — Telephone Encounter (Signed)
I would like for this request to go to Dr. Lajuana Ripple, it can wait until she is back in the office.  If the patient's appointment is not until November 20 there is plenty of time for her to do the labs.  In this way the labs are in Dr. Marjean Donna name.

## 2019-01-13 NOTE — Telephone Encounter (Signed)
Left message patient needs to make an appt before any refills.

## 2019-01-13 NOTE — Telephone Encounter (Signed)
What would you like for patient to have pcp out.

## 2019-01-14 ENCOUNTER — Other Ambulatory Visit: Payer: Self-pay

## 2019-01-14 ENCOUNTER — Other Ambulatory Visit: Payer: Medicare Other

## 2019-01-15 LAB — THYROID PANEL WITH TSH
Free Thyroxine Index: 2.1 (ref 1.2–4.9)
T3 Uptake Ratio: 27 % (ref 24–39)
T4, Total: 7.6 ug/dL (ref 4.5–12.0)
TSH: 2.5 u[IU]/mL (ref 0.450–4.500)

## 2019-01-19 ENCOUNTER — Ambulatory Visit: Payer: Self-pay | Admitting: Licensed Clinical Social Worker

## 2019-01-19 DIAGNOSIS — E039 Hypothyroidism, unspecified: Secondary | ICD-10-CM

## 2019-01-19 DIAGNOSIS — E559 Vitamin D deficiency, unspecified: Secondary | ICD-10-CM

## 2019-01-19 DIAGNOSIS — F339 Major depressive disorder, recurrent, unspecified: Secondary | ICD-10-CM

## 2019-01-19 DIAGNOSIS — F419 Anxiety disorder, unspecified: Secondary | ICD-10-CM

## 2019-01-19 NOTE — Chronic Care Management (AMB) (Signed)
  Care Management Note   Amber Rivas is a 73 y.o. year old female who is a primary care patient of [name], DO. The CM team was consulted for assistance with chronic disease management and care coordination.   I reached out to [employer] by phone today.    Review of patient status, including review of consultants reports, relevant laboratory and other test results, and collaboration with appropriate care team members and the patient's provider was performed as part of comprehensive patient evaluation and provision of chronic care management services.   Social determinants of health: risk of social isolation; risk of stress;risk of financial strain; risk of tobacco use    Chronic Care Management from 09/10/2018 in [place]  PHQ-9 Total Score  4     GAD 7 : Generalized Anxiety Score 09/05/2018 06/13/2018 06/03/2018 05/20/2017  Nervous, Anxious, on Edge 3 2 3 2   Control/stop worrying 0 2 1 3   Worry too much - different things 0 2 1 3   Trouble relaxing 0 1 1 2   Restless 0 1 0 2  Easily annoyed or irritable 0 0 0 0  Afraid - awful might happen 0 0 0 0  Total GAD 7 Score 3 8 6 12   Anxiety Difficulty - Very difficult - -   Medications   New medications from outside sources are available for reconciliation   acetaminophen (TYLENOL) 500 MG tablet    aspirin EC 81 MG tablet    aspirin-acetaminophen-caffeine (EXCEDRIN MIGRAINE) 250-250-65 MG tablet    B-COMPLEX-C PO    buPROPion (WELLBUTRIN) 75 MG tablet    levothyroxine (SYNTHROID) 100 MCG tablet    Multiple Vitamin (MULTIVITAMIN WITH MINERALS) TABS tablet    Omega-3 Fatty Acids (OMEGA 3 PO)    SUMAtriptan (IMITREX) 25 MG tablet(Expired     Goals    . Client said:  " I need to talk with someone about stress, anxiety related to family members' health issues" (pt-stated)     Current Barriers:  [name] Mental Health Concerns   . Family members health issues of concern  Clinical Social Work Clinical  Goal(s):  [name] Over the next 30 days, client will work with [name] to address concerns related to stress concerning health needs of family members  Interventions: . [name] talked with client previously about relaxation techniques of choice for client . Previously talked with client about grief issues of client related to death of 2 of her brothers  Patient Self Care Activities:  . Self administers medications as prescribed . Attends all scheduled provider appointments   Plan:  Client to call [name] as needed to discuss mental health needs of client Client to attend scheduled medical appointments Client to use relaxation techniques of choice to help manage client stress/anxiety symptoms [name] to call client in next 3 weeks to talk with client about stress management for client  Initial goal documentation    .   Follow Up Plan: [name] to call client in next 3 weeks to talk with client about stress management for client   [name].Nyaire Denbleyker MSW, [name] Licensed Clinical Social Worker [facility] Care [facility] [phone]

## 2019-01-19 NOTE — Patient Instructions (Addendum)
Licensed Clinical Social Worker Visit Information  Goals we discussed today:  Goals    . Client said:  " I need to talk with someone about stress, anxiety related to family members' health issues" (pt-stated)     Current Barriers:  [name] Mental Health Concerns   . Family members health issues of concern  Clinical Social Work Clinical Goal(s):  [name] Over the next 30 days, client will work with [name] to address concerns related to stress concerning health needs of family members  Interventions: . [name] talked with client previously about relaxation techniques of choice for client  Previously talked with client about grief issues of client related to death of 2 of her brothers  Patient Self Care Activities:  . Self administers medications as prescribed . Attends all scheduled provider appointments   Plan:  Client to call [name] as needed to discuss mental health needs of client Client to attend scheduled medical appointments Client to use relaxation techniques of choice to help manage client stress/anxiety symptoms [name] to call client in next 3 weeks to talk with client about stress management for client  Initial goal documentation    .      Materials Provided: No  Follow Up Plan:  [name] to call client in next 3 weeks to talk with client about stress management for client  The patient verbalized understanding of instructions provided today and declined a print copy of patient instruction materials.    [name].Yardley Beltran MSW, [name] Licensed Clinical Social Worker [facility] [phone]

## 2019-01-20 NOTE — Telephone Encounter (Signed)
She had thyroid panel.  That is all she is due for.  Panel was appropriate.

## 2019-01-20 NOTE — Telephone Encounter (Signed)
Patient aware and verbalized understanding. °

## 2019-02-09 ENCOUNTER — Other Ambulatory Visit: Payer: Self-pay | Admitting: Family Medicine

## 2019-02-09 ENCOUNTER — Ambulatory Visit (INDEPENDENT_AMBULATORY_CARE_PROVIDER_SITE_OTHER): Payer: Medicare Other | Admitting: Licensed Clinical Social Worker

## 2019-02-09 DIAGNOSIS — E039 Hypothyroidism, unspecified: Secondary | ICD-10-CM

## 2019-02-09 DIAGNOSIS — E559 Vitamin D deficiency, unspecified: Secondary | ICD-10-CM

## 2019-02-09 DIAGNOSIS — E890 Postprocedural hypothyroidism: Secondary | ICD-10-CM

## 2019-02-09 DIAGNOSIS — F419 Anxiety disorder, unspecified: Secondary | ICD-10-CM

## 2019-02-09 DIAGNOSIS — F339 Major depressive disorder, recurrent, unspecified: Secondary | ICD-10-CM | POA: Diagnosis not present

## 2019-02-09 DIAGNOSIS — Z6379 Other stressful life events affecting family and household: Secondary | ICD-10-CM

## 2019-02-09 NOTE — Patient Instructions (Addendum)
Licensed Clinical Social Worker Visit Information  Goals we discussed today:  Goals    . Client said:  " I need to talk with someone about stress, anxiety related to family members' health issues" (pt-stated)     Current Barriers:  [name] Mental Health Concerns   . Family members health issues of concern  Clinical Social Work Clinical Goal(s):  [name] Over the next 30 days, client will work with [name] to address concerns related to stress concerning health needs of family members  Interventions:  [name] provided counseling support for client  Talked with client about grief issues of client  [name] talked with client about relaxation techniques of choice for client (enjoys reading, enjoys seeing her grandchildren)  Talked with client about health needs of family members  Talked with client about self care for client  Patient Self Care Activities:  . Self administers medications as prescribed . Attends all scheduled provider appointments   Plan:  Client to call [name] as needed to discuss mental health needs of client Client to attend scheduled medical appointments Client to use relaxation techniques of choice to help manage client stress/anxiety symptoms [name] to call client in next 3 weeks to talk with client about stress management for client  Initial goal documentation    .      Materials Provided: No  Follow Up Plan: [name] to call client in next 3 weeks to talk with client about stress management for client  The patient verbalized understanding of instructions provided today and declined a print copy of patient instruction materials.   [name].Wilena Tyndall MSW, [name] Licensed Clinical Social Worker [facility] [phone]

## 2019-02-09 NOTE — Chronic Care Management (AMB) (Signed)
  Care Management Note   Amber Rivas is a 73 y.o. year old female who is a primary care patient of [name], DO. The CM team was consulted for assistance with chronic disease management and care coordination.   I reached out to [employer] by phone today.   Review of patient status, including review of consultants reports, relevant laboratory and other test results, and collaboration with appropriate care team members and the patient's provider was performed as part of comprehensive patient evaluation and provision of chronic care management services.  Social determinants of health: risk of social isolation; risk of depression; grief issues    Chronic Care Management from 09/10/2018 in [facility]  PHQ-9 Total Score  4     GAD 7 : Generalized Anxiety Score 09/05/2018 06/13/2018 06/03/2018 05/20/2017  Nervous, Anxious, on Edge 3 2 3 2   Control/stop worrying 0 2 1 3   Worry too much - different things 0 2 1 3   Trouble relaxing 0 1 1 2   Restless 0 1 0 2  Easily annoyed or irritable 0 0 0 0  Afraid - awful might happen 0 0 0 0  Total GAD 7 Score 3 8 6 12   Anxiety Difficulty - Very difficult - -   Medications   New medications from outside sources are available for reconciliation   acetaminophen (TYLENOL) 500 MG tablet    aspirin EC 81 MG tablet    aspirin-acetaminophen-caffeine (EXCEDRIN MIGRAINE) 250-250-65 MG tablet    B-COMPLEX-C PO    buPROPion (WELLBUTRIN) 75 MG tablet    levothyroxine (SYNTHROID) 100 MCG tablet    Multiple Vitamin (MULTIVITAMIN WITH MINERALS) TABS tablet    Omega-3 Fatty Acids (OMEGA 3 PO)    SUMAtriptan (IMITREX) 25 MG tablet(Expired)     Goals    . Client said:  " I need to talk with someone about stress, anxiety related to family members' health issues" (pt-stated)     Current Barriers:  [name] Mental Health Concerns   . Family members health issues of concern  Clinical Social Work Clinical Goal(s):  [name] Over the next 30  days, client will work with [name] to address concerns related to stress concerning health needs of family members  Interventions: . [name] provided counseling support for client . Talked with client about grief issues of client . [name] talked with client about relaxation techniques of choice for client (enjoys reading, enjoys seeing her grandchildren) . Talked with client about health needs of family members . Talked with client about self care for client  Patient Self Care Activities:  . Self administers medications as prescribed . Attends all scheduled provider appointments   Plan:  Client to call [name] as needed to discuss mental health needs of client Client to attend scheduled medical appointments Client to use relaxation techniques of choice to help manage client stress/anxiety symptoms [name] to call client in next 3 weeks to talk with client about stress management for client  Initial goal documentation    .    Follow Up Plan:  [name] to call client in next 3 weeks to talk with client about stress management for client.  [name].Ramona Slinger MSW, [name] Licensed Clinical Social Worker [facility] [phone]

## 2019-02-12 ENCOUNTER — Other Ambulatory Visit: Payer: Self-pay

## 2019-02-13 ENCOUNTER — Encounter: Payer: Self-pay | Admitting: Family Medicine

## 2019-02-13 ENCOUNTER — Ambulatory Visit (INDEPENDENT_AMBULATORY_CARE_PROVIDER_SITE_OTHER): Payer: Medicare Other | Admitting: Family Medicine

## 2019-02-13 VITALS — BP 127/89 | HR 91 | Temp 98.4°F | Ht 64.0 in | Wt 190.0 lb

## 2019-02-13 DIAGNOSIS — Z23 Encounter for immunization: Secondary | ICD-10-CM | POA: Diagnosis not present

## 2019-02-13 DIAGNOSIS — E890 Postprocedural hypothyroidism: Secondary | ICD-10-CM | POA: Diagnosis not present

## 2019-02-13 DIAGNOSIS — F4321 Adjustment disorder with depressed mood: Secondary | ICD-10-CM

## 2019-02-13 DIAGNOSIS — F339 Major depressive disorder, recurrent, unspecified: Secondary | ICD-10-CM

## 2019-02-13 DIAGNOSIS — F439 Reaction to severe stress, unspecified: Secondary | ICD-10-CM

## 2019-02-13 MED ORDER — LEVOTHYROXINE SODIUM 100 MCG PO TABS: ORAL_TABLET | ORAL | 1 refills | Status: DC

## 2019-02-13 MED ORDER — BUPROPION HCL 75 MG PO TABS: 75.0000 mg | ORAL_TABLET | Freq: Two times a day (BID) | ORAL | 3 refills | Status: DC

## 2019-02-13 NOTE — Addendum Note (Signed)
Addended byCarrolyn Leigh on: 02/13/2019 04:38 PM   Modules accepted: Orders

## 2019-02-13 NOTE — Progress Notes (Signed)
Subjective: CC: f/u depression/ stress PCP: [name], DO [id number] W High is a 73 y.o. female presenting to clinic today for:  1.  Depressive disorder/stress The patient was switched back to Wellbutrin 75 mg twice daily at our last visit as she felt that this was working better than the extended release.  Since her last visit her brother who was in hospice has passed away.  She also notes that her older brother passed away suddenly after he developed pancreatitis.  She is coping with both of these deaths but has found it somewhat difficult.  She took 2 trips to the beach in efforts to relax herself and she did find these helpful.  She has since resumed use of Wellbutrin twice daily fairly regularly.  She had been using it as needed.  She also notes that her dog has developed a heart tumor which he is currently being treated for.  And she continues to meet with [facility] intermittently and does find the sessions helpful.  She admits to having some spells of anxiety that wake her from sleep.  2. Hypothyroidism, post surgical Patient is compliant with Synthroid 100 mcg daily.  Her last TSH in October was within normal range.  She denies any heart palpitations, tremoring, unplanned weight loss.  Does not report any changes in stool, difficulty swallowing or change in voice.  ROS: Per HPI  Allergies  Allergen Reactions  . Sulfur Anaphylaxis   Past Medical History:  Diagnosis Date  . Arthritis    foot  . Headache    Migraines  . History of hiatal hernia    slight noted on imaging  . Hyperthyroidism   . Pneumonia    history of    Current Outpatient Medications:  .  acetaminophen (TYLENOL) 500 MG tablet, Take 500 mg by mouth every 6 (six) hours as needed for mild pain or moderate pain., Disp: , Rfl:  .  aspirin EC 81 MG tablet, Take 81 mg by mouth daily as needed for mild pain or moderate pain. , Disp: , Rfl:  .  aspirin-acetaminophen-caffeine (EXCEDRIN MIGRAINE) 250-250-65 MG  tablet, Take 1 tablet by mouth every 6 (six) hours as needed for headache. , Disp: , Rfl:  .  B-COMPLEX-C PO, Take 1 tablet by mouth daily., Disp: , Rfl:  .  buPROPion (WELLBUTRIN) 75 MG tablet, Take 1 tablet (75 mg total) by mouth 2 (two) times daily. (Needs to be seen before next refill), Disp: 60 tablet, Rfl: 0 .  levothyroxine (SYNTHROID) 100 MCG tablet, TAKE 1 TABLET BY MOUTH DAILY (needs labwork), Disp: 30 tablet, Rfl: 11 .  Multiple Vitamin (MULTIVITAMIN WITH MINERALS) TABS tablet, Take 1 tablet by mouth daily., Disp: , Rfl:  .  Omega-3 Fatty Acids (OMEGA 3 PO), Take 1 capsule by mouth daily. , Disp: , Rfl:  Social History   Socioeconomic History  . Marital status: Married    Spouse name: [name]  . Number of children: 4  . Years of education: Bachelors Degree  . Highest education level: Bachelor's degree (e.g., BA, AB, BS)  Occupational History  . Occupation: Retired    Fish farm manager: [employer]  . Financial resource strain: Not hard at all  . Food insecurity    Worry: Never true    Inability: Never true  . Transportation needs    Medical: No    Non-medical: No  Tobacco Use  . Smoking status: Never Smoker  . Smokeless tobacco: Never Used  Substance and  Sexual Activity  . Alcohol use: Yes    Comment: rare  . Drug use: No  . Sexual activity: Yes    Birth control/protection: Post-menopausal  Lifestyle  . Physical activity    Days per week: 7 days    Minutes per session: 40 min  . Stress: Not at all  Relationships  . Social connections    Talks on phone: More than three times a week    Gets together: More than three times a week    Attends religious service: More than 4 times per year    Active member of club or organization: No    Attends meetings of clubs or organizations: Never    Relationship status: Married  . Intimate partner violence    Fear of current or ex partner: No    Emotionally abused: No    Physically abused: No    Forced  sexual activity: No  Other Topics Concern  . Not on file  Social History Narrative  . Not on file   Family History  Problem Relation Age of Onset  . Colon cancer Brother   . Cancer Brother   . Lung disease Sister   . Hypothyroidism Sister   . Cancer Brother     Objective: Office vital signs reviewed. BP 127/89   Pulse 91   Temp 98.4 F (36.9 C) (Temporal)   Ht 5\' 4"  (1.626 m)   Wt 190 lb (86.2 kg)   SpO2 98%   BMI 32.61 kg/m   Physical Examination:  General: Awake, alert, well nourished, No acute distress HEENT: Normal    Neck: No masses palpated.  Thyroid surgically absent    Eyes:   No exophthalmos Cardio: regular rate and rhythm, S1S2 heard, no murmurs appreciated Pulm: clear to auscultation bilaterally, no wheezes, rhonchi or rales; normal work of breathing on room air Skin: dry; intact; no rashes or lesions; normal temperature Neuro: No tremor Psych: Mood stable.  Speech normal.  Does not appear to be responding to internal stimuli. Depression screen [facility] 2/9 02/13/2019 02/09/2019 09/10/2018  Decreased Interest 0 1 1  Down, Depressed, Hopeless 1 1 1   PHQ - 2 Score 1 2 2   Altered sleeping 0 1 1  Tired, decreased energy 0 1 1  Change in appetite 0 0 0  Feeling bad or failure about yourself  0 0 0  Trouble concentrating 0 0 0  Moving slowly or fidgety/restless 0 0 0  Suicidal thoughts 0 0 0  PHQ-9 Score 1 4 4   Difficult doing work/chores Not difficult at all Somewhat difficult Somewhat difficult  Some recent data might be hidden   GAD 7 : Generalized Anxiety Score 02/13/2019 09/05/2018 06/13/2018 06/03/2018  Nervous, Anxious, on Edge 0 3 2 3   Control/stop worrying 2 0 2 1  Worry too much - different things 2 0 2 1  Trouble relaxing 0 0 1 1  Restless 0 0 1 0  Easily annoyed or irritable 0 0 0 0  Afraid - awful might happen 1 0 0 0  Total GAD 7 Score 5 3 8 6   Anxiety Difficulty Not difficult at all - Very difficult -    Assessment/ Plan: 73 y.o. female    1. Postoperative hypothyroidism Asymptomatic.  Thyroid panel within normal range.  Synthroid sent to pharmacy - levothyroxine (SYNTHROID) 100 MCG tablet; TAKE 1 TABLET BY MOUTH DAILY  Dispense: 90 tablet; Refill: 1  2. Depression, recurrent ([place]) Controlled with Wellbutrin.  I have asked her to  reach out to me prior to her next follow-up visit if symptoms decline further. - buPROPion (WELLBUTRIN) 75 MG tablet; Take 1 tablet (75 mg total) by mouth 2 (two) times daily.  Dispense: 180 tablet; Refill: 3  3. Grief She is currently working with a Social worker and feels that she has a good support at home.  4. Stress at home - buPROPion (WELLBUTRIN) 75 MG tablet; Take 1 tablet (75 mg total) by mouth 2 (two) times daily.  Dispense: 180 tablet; Refill: 3   No orders of the defined types were placed in this encounter.  No orders of the defined types were placed in this encounter.    [name], DO [facility] [phone]

## 2019-03-04 ENCOUNTER — Ambulatory Visit (INDEPENDENT_AMBULATORY_CARE_PROVIDER_SITE_OTHER): Payer: Medicare Other | Admitting: Licensed Clinical Social Worker

## 2019-03-04 DIAGNOSIS — E039 Hypothyroidism, unspecified: Secondary | ICD-10-CM | POA: Diagnosis not present

## 2019-03-04 DIAGNOSIS — F329 Major depressive disorder, single episode, unspecified: Secondary | ICD-10-CM

## 2019-03-04 DIAGNOSIS — E559 Vitamin D deficiency, unspecified: Secondary | ICD-10-CM

## 2019-03-04 DIAGNOSIS — F32A Depression, unspecified: Secondary | ICD-10-CM

## 2019-03-04 NOTE — Chronic Care Management (AMB) (Signed)
  Care Management Note   Amber Rivas is a 73 y.o. year old female who is a primary care patient of [name], DO. The CM team was consulted for assistance with chronic disease management and care coordination.   I reached out to [employer] by phone today.   Review of patient status, including review of consultants reports, relevant laboratory and other test results, and collaboration with appropriate care team members and the patient's provider was performed as part of comprehensive patient evaluation and provision of chronic care management services.   Social determinants of health: risk of social isolation; risk of depression; risk of stress    Office Visit from 02/13/2019 in [facility]  PHQ-9 Total Score  1      GAD 7 : Generalized Anxiety Score 02/13/2019 09/05/2018 06/13/2018 06/03/2018  Nervous, Anxious, on Edge 0 3 2 3   Control/stop worrying 2 0 2 1  Worry too much - different things 2 0 2 1  Trouble relaxing 0 0 1 1  Restless 0 0 1 0  Easily annoyed or irritable 0 0 0 0  Afraid - awful might happen 1 0 0 0  Total GAD 7 Score 5 3 8 6   Anxiety Difficulty Not difficult at all - Very difficult -    Medications    acetaminophen (TYLENOL) 500 MG tablet    aspirin EC 81 MG tablet    aspirin-acetaminophen-caffeine (EXCEDRIN MIGRAINE) 250-250-65 MG tablet    B-COMPLEX-C PO    buPROPion (WELLBUTRIN) 75 MG tablet    levothyroxine (SYNTHROID) 100 MCG tablet    Multiple Vitamin (MULTIVITAMIN WITH MINERALS) TABS tablet    Omega-3 Fatty Acids (OMEGA 3 PO)     Goals Addressed            This Visit's Progress   . Client said:  " I need to talk with someone about stress, anxiety related to family members' health issues" (pt-stated)       Current Barriers:  [name] Mental Health Concerns  of client with chronic diagnoses of Hypothyroidism, Vitamin D Deficiency, Depressive Disorder . Family members health issues of concern  Clinical Social Work  Clinical Goal(s):  [name] Over the next 30 days, client will work with [name] to address concerns related to stress concerning health needs of family members  Interventions: . [name] provided counseling support for client . [name] talked previously with client about relaxation techniques of choice for client  Talked with client about current needs of client  Talked with client about her use of medications prescribed (she said she takes        Welbutrin prescribed dose occasionally)   Patient Self Care Activities:  . Self administers medications as prescribed . Attends all scheduled provider appointments   Plan:  Client to call [name] as needed to discuss mental health needs of client Client to attend scheduled medical appointments Client to use relaxation techniques of choice to help manage client stress/anxiety symptoms [name] to call client in next 4 weeks to talk with client about stress management for client  Initial goal documentation     Follow Up Plan: [name] to call client in next 4 weeks to talk with client about stress management for client  [name].Genita Nilsson MSW, [name] Licensed Clinical Social Worker [facility] [phone]

## 2019-03-04 NOTE — Patient Instructions (Addendum)
Licensed Clinical Social Worker Visit Information  Goals we discussed today:  Goals Addressed            This Visit's Progress   . Client said:  " I need to talk with someone about stress, anxiety related to family members' health issues" (pt-stated)       Current Barriers:  [name] Mental Health Concerns  of client with chronic diagnoses of Viamin D deficiency, Hypothyrodism, and Depressive Disorder . Family members health issues of concern  Clinical Social Work Clinical Goal(s):  [name] Over the next 30 days, client will work with [name] to address concerns related to stress concerning health needs of family members  Interventions:  [name] provided counseling support for client  [name] talked previously with client about relaxation techniques of choice for client  Talked with client about current needs of client  Talked with client about her use of medications prescribed (she said she takes        Welbutrin prescribed dose occasionally)  Patient Self Care Activities:  . Self administers medications as prescribed . Attends all scheduled provider appointments   Plan:  Client to call [name] as needed to discuss mental health needs of client Client to attend scheduled medical appointments Client to use relaxation techniques of choice to help manage client stress/anxiety symptoms [name] to call client in next 4 weeks to talk with client about stress management for client  Initial goal documentation        Materials Provided:  No  Follow Up Plan: [name] to call client in next 4 weeks to talk with client about stress management for client  The patient verbalized understanding of instructions provided today and declined a print copy of patient instruction materials.   [name].Aubriella Perezgarcia MSW, [name] Licensed Clinical Social Worker [facility] [phone]

## 2019-04-03 ENCOUNTER — Ambulatory Visit (INDEPENDENT_AMBULATORY_CARE_PROVIDER_SITE_OTHER): Payer: Medicare PPO | Admitting: Licensed Clinical Social Worker

## 2019-04-03 ENCOUNTER — Telehealth: Payer: Self-pay | Admitting: Family Medicine

## 2019-04-03 DIAGNOSIS — F32A Depression, unspecified: Secondary | ICD-10-CM

## 2019-04-03 DIAGNOSIS — E039 Hypothyroidism, unspecified: Secondary | ICD-10-CM

## 2019-04-03 DIAGNOSIS — F329 Major depressive disorder, single episode, unspecified: Secondary | ICD-10-CM

## 2019-04-03 DIAGNOSIS — E559 Vitamin D deficiency, unspecified: Secondary | ICD-10-CM

## 2019-04-03 NOTE — Patient Instructions (Addendum)
Licensed Clinical Social Worker Visit Information  Goals we discussed today:  Goals Addressed            This Visit's Progress   . Client said:  " I need to talk with someone about stress, anxiety related to family members' health issues" (pt-stated)       Current Barriers:  [name] Mental Health Concerns  of client with chronic diagnoses of Depressive Disorder, Hypothyroidism, Vitamin D deficiency . Family members health issues of concern  Clinical Social Work Clinical Goal(s):  [name] Over the next 30 days, client will work with [name] to address concerns related to stress concerning health needs of family members  Interventions:  [name] talkedpreviouslywith client about relaxation techniques of choice for client  Previously talked with client about current needs of client  Previously talked with client about her use of medications prescribed (she said she takes Welbutrin prescribed dose occasionally)  Patient Self Care Activities:  . Self administers medications as prescribed . Attends all scheduled provider appointments   Plan:  Client to call [name] as needed to discuss mental health needs of client Client to attend scheduled medical appointments Client to use relaxation techniques of choice to help manage client stress/anxiety symptoms [name] to call client in next 4 weeks to talk with client about stress management for client  Initial goal documentation        Materials Provided: No  Follow Up Plan: [name] to call client in next 4 weeks to talk with client about stress management for client  The patient verbalized understanding of instructions provided today and declined a print copy of patient instruction materials.   [name].Dynasti Kerman MSW, [name] Licensed Clinical Social Worker [facility] [phone]

## 2019-04-03 NOTE — Chronic Care Management (AMB) (Signed)
  Care Management Note   Amber Rivas is a 75 y.o. year old female who is a primary care patient of Amber Norlander, DO. The CM team was consulted for assistance with chronic disease management and care coordination.   I reached out to [employer] by phone today.    Review of patient status, including review of consultants reports, relevant laboratory and other test results, and collaboration with appropriate care team members and the patient's provider was performed as part of comprehensive patient evaluation and provision of chronic care management services.   Social determinants of health: risk of tobacco use; risk of depression    Office Visit from 02/13/2019 in [place]  PHQ-9 Total Score  1     GAD 7 : Generalized Anxiety Score 02/13/2019 09/05/2018 06/13/2018 06/03/2018  Nervous, Anxious, on Edge 0 3 2 3   Control/stop worrying 2 0 2 1  Worry too much - different things 2 0 2 1  Trouble relaxing 0 0 1 1  Restless 0 0 1 0  Easily annoyed or irritable 0 0 0 0  Afraid - awful might happen 1 0 0 0  Total GAD 7 Score 5 3 8 6   Anxiety Difficulty Not difficult at all - Very difficult -   Medications   (very important)  New medications from outside sources are available for reconciliation  acetaminophen (TYLENOL) 500 MG tablet aspirin EC 81 MG tablet aspirin-acetaminophen-caffeine (EXCEDRIN MIGRAINE) 250-250-65 MG tablet B-COMPLEX-C PO buPROPion (WELLBUTRIN) 75 MG tablet levothyroxine (SYNTHROID) 100 MCG tablet Multiple Vitamin (MULTIVITAMIN WITH MINERALS) TABS tablet Omega-3 Fatty Acids (OMEGA 3 PO)  Goals Addressed            This Visit's Progress   . Client said:  " I need to talk with someone about stress, anxiety related to family members' health issues" (pt-stated)       Current Barriers:  [name] Mental Health Concerns  of client with chronic diagnoses of Depressive Disorder, Hypothyroidism, Vitamin D deficiency . Family members health  issues of concern  Clinical Social Work Clinical Goal(s):  [name] Over the next 30 days, client will work with [name] to address concerns related to stress concerning health needs of family members  Interventions:  [name] talked previously with client about relaxation techniques of choice for client  Previously talked with client about current needs of client  Previously talked with client about her use of medications prescribed (she said she takes        Welbutrin prescribed dose occasionally)  Patient Self Care Activities:  . Self administers medications as prescribed . Attends all scheduled provider appointments   Plan:  Client to call [name] as needed to discuss mental health needs of client Client to attend scheduled medical appointments Client to use relaxation techniques of choice to help manage client stress/anxiety symptoms [name] to call client in next 4 weeks to talk with client about stress management for client  Initial goal documentation      Follow Up Plan: [name] to call client in next 4 weeks to talk with client about stress management for client  [name].Isao Seltzer MSW, [name] Licensed Clinical Social Worker [facility] [phone]

## 2019-05-01 ENCOUNTER — Ambulatory Visit: Payer: Self-pay | Admitting: Licensed Clinical Social Worker

## 2019-05-01 DIAGNOSIS — F329 Major depressive disorder, single episode, unspecified: Secondary | ICD-10-CM

## 2019-05-01 DIAGNOSIS — F32A Depression, unspecified: Secondary | ICD-10-CM

## 2019-05-01 DIAGNOSIS — E559 Vitamin D deficiency, unspecified: Secondary | ICD-10-CM

## 2019-05-01 DIAGNOSIS — E039 Hypothyroidism, unspecified: Secondary | ICD-10-CM

## 2019-05-01 NOTE — Chronic Care Management (AMB) (Signed)
  Care Management Note   Amber Rivas is a 74 y.o. year old female who is a primary care patient of Amber Norlander, DO. The CM team was consulted for assistance with chronic disease management and care coordination.   I reached out to [employer] by phone today.   Review of patient status, including review of consultants reports, relevant laboratory and other test results, and collaboration with appropriate care team members and the patient's provider was performed as part of comprehensive patient evaluation and provision of chronic care management services.   Social determinants of health: risk of tobacco use; risk of depression    Office Visit from 02/13/2019 in [place]  PHQ-9 Total Score  1     GAD 7 : Generalized Anxiety Score 02/13/2019 09/05/2018 06/13/2018 06/03/2018  Nervous, Anxious, on Edge 0 3 2 3   Control/stop worrying 2 0 2 1  Worry too much - different things 2 0 2 1  Trouble relaxing 0 0 1 1  Restless 0 0 1 0  Easily annoyed or irritable 0 0 0 0  Afraid - awful might happen 1 0 0 0  Total GAD 7 Score 5 3 8 6   Anxiety Difficulty Not difficult at all - Very difficult -   Medications   (very important)  New medications from outside sources are available for reconciliation  acetaminophen (TYLENOL) 500 MG tablet aspirin EC 81 MG tablet aspirin-acetaminophen-caffeine (EXCEDRIN MIGRAINE) 250-250-65 MG tablet B-COMPLEX-C PO buPROPion (WELLBUTRIN) 75 MG tablet levothyroxine (SYNTHROID) 100 MCG tablet Multiple Vitamin (MULTIVITAMIN WITH MINERALS) TABS tablet Omega-3 Fatty Acids (OMEGA 3 PO)  Goals    . Client said:  " I need to talk with someone about stress, anxiety related to family members' health issues" (pt-stated)     Current Barriers:  [name] Mental Health Concerns  of client with chronic diagnoses of Depressive Disorder, Hypothyroidism, Vitamin D deficiency . Family members health issues of concern  Clinical Social Work Clinical  Goal(s):  [name] Over the next 30 days, client will work with [name] to address concerns related to stress concerning health needs of family members  Interventions:  [name] talkedpreviouslywith client about relaxation techniques of choice for client  Previously talked with client about current needs of client  Previously talked with client about her use of medications prescribed (she said she takes Welbutrin prescribed dose occasionally)  Patient Self Care Activities:  . Self administers medications as prescribed . Attends all scheduled provider appointments   Plan:  Client to call [name] as needed to discuss mental health needs of client Client to attend scheduled medical appointments Client to use relaxation techniques of choice to help manage client stress/anxiety symptoms [name] to call client in next 4 weeks to talk with client about stress management for client  Initial goal documentation    Follow Up Plan: [name] to call client in next 4 weeks to talk with client about stress management of client  [name].Antrell Tipler MSW, [name] Licensed Clinical Social Worker [facility] Care Management [phone]

## 2019-05-01 NOTE — Patient Instructions (Addendum)
Licensed Clinical Social Worker Visit Information  Goals we discussed today:  Goals    . Client said:  " I need to talk with someone about stress, anxiety related to family members' health issues" (pt-stated)     Current Barriers:  [name] Mental Health Concerns  of client with chronic diagnoses of Depressive Disorder, Hypothyroidism, Vitamin D deficiency . Family members health issues of concern  Clinical Social Work Clinical Goal(s):  [name] Over the next 30 days, client will work with [name] to address concerns related to stress concerning health needs of family members  Interventions:   [name] talkedpreviouslywith client about relaxation techniques of choice for client  Previously talked with client about current needs of client  Previously talked with client about her use of medications prescribed (she said she takes Welbutrin prescribed dose occasionally)  Patient Self Care Activities:  . Self administers medications as prescribed . Attends all scheduled provider appointments   Plan:  Client to call [name] as needed to discuss mental health needs of client Client to attend scheduled medical appointments Client to use relaxation techniques of choice to help manage client stress/anxiety symptoms [name] to call client in next 4 weeks to talk with client about stress management for client  Initial goal documentation    Materials Provided: No  Follow Up Plan: [name] to call client in next 4 weeks to talk with client about stress  management for client   The patient verbalized understanding of instructions provided today and declined a print copy of patient instruction materials.   [name].Payten Hobin MSW, [name] Licensed Clinical Social Worker [facility] Care Management [phone]

## 2019-05-18 ENCOUNTER — Ambulatory Visit (INDEPENDENT_AMBULATORY_CARE_PROVIDER_SITE_OTHER): Payer: Medicare PPO | Admitting: Licensed Clinical Social Worker

## 2019-05-18 DIAGNOSIS — F329 Major depressive disorder, single episode, unspecified: Secondary | ICD-10-CM

## 2019-05-18 DIAGNOSIS — F32A Depression, unspecified: Secondary | ICD-10-CM

## 2019-05-18 DIAGNOSIS — E559 Vitamin D deficiency, unspecified: Secondary | ICD-10-CM

## 2019-05-18 DIAGNOSIS — E039 Hypothyroidism, unspecified: Secondary | ICD-10-CM

## 2019-05-18 NOTE — Chronic Care Management (AMB) (Signed)
  Care Management Note   Amber Rivas is a 74 y.o. year old female who is a primary care patient of [name], DO. The CM team was consulted for assistance with chronic disease management and care coordination.   I reached out to [employer] by phone today.    Review of patient status, including review of consultants reports, relevant laboratory and other test results, and collaboration with appropriate care team members and the patient's provider was performed as part of comprehensive patient evaluation and provision of chronic care management services.   Social determinants of health: risk of tobacco use; risk of depression    Office Visit from 02/13/2019 in [place]  PHQ-9 Total Score  1     GAD 7 : Generalized Anxiety Score 02/13/2019 09/05/2018 06/13/2018 06/03/2018  Nervous, Anxious, on Edge 0 3 2 3   Control/stop worrying 2 0 2 1  Worry too much - different things 2 0 2 1  Trouble relaxing 0 0 1 1  Restless 0 0 1 0  Easily annoyed or irritable 0 0 0 0  Afraid - awful might happen 1 0 0 0  Total GAD 7 Score 5 3 8 6   Anxiety Difficulty Not difficult at all - Very difficult -   Medications   (very important)  New medications from outside sources are available for reconciliation  acetaminophen (TYLENOL) 500 MG tablet aspirin EC 81 MG tablet aspirin-acetaminophen-caffeine (EXCEDRIN MIGRAINE) 250-250-65 MG tablet B-COMPLEX-C PO buPROPion (WELLBUTRIN) 75 MG tablet levothyroxine (SYNTHROID) 100 MCG tablet Multiple Vitamin (MULTIVITAMIN WITH MINERALS) TABS tablet Omega-3 Fatty Acids (OMEGA 3 PO)  Goals    . Client said:  " I need to talk with someone about stress, anxiety related to family members' health issues" (pt-stated)     Current Barriers:  [name] Mental Health Concerns  of client with chronic diagnoses of Depressive Disorder, Hypothyroidism, Vitamin D deficiency . Family members health issues of concern  Clinical Social Work Clinical  Goal(s):  [name] Over the next 30 days, client will work with [name] to address concerns related to stress concerning health needs of family members  Interventions:  [name] talkedwith client about relaxation techniques of choice for client  Previously talked with client about current needs of client  Talked with client about grief issues of client  Provided counseling support for client  Talked with client about fact that she is on waiting list for COVID 19 vaccine. Talked with client about COVID-19 vaccine sites in the area  Patient Self Care Activities:  . Self administers medications as prescribed . Attends all scheduled provider appointments   Plan:  Client to call [name] as needed to discuss mental health needs of client Client to attend scheduled medical appointments Client to use relaxation techniques of choice to help manage client stress/anxiety symptoms [name] to call client in next 4 weeks to talk with client about stress management for client  Initial goal documentation    .    Follow Up Plan: [name] to call client in next 4 weeks to talk with client about stress management for client  [name].Sueellen Kayes MSW, [name] Licensed Clinical Social Worker [facility] Care Management [phone]

## 2019-05-18 NOTE — Patient Instructions (Addendum)
Licensed Clinical Social Worker Visit Information  Goals we discussed today:  Goals    . Client said:  " I need to talk with someone about stress, anxiety related to family members' health issues" (pt-stated)     Current Barriers:  [name] Mental Health Concerns  of client with chronic diagnoses of Depressive Disorder, Hypothyroidism, Vitamin D deficiency . Family members health issues of concern  Clinical Social Work Clinical Goal(s):  [name] Over the next 30 days, client will work with [name] to address concerns related to stress concerning health needs of family members  Interventions:   [name] talkedwith client about relaxation techniques of choice for client  Previously talked with client about current needs of client  Talked with client about grief issues of client  Provided counseling support for client  Talked with client about fact that she is on waiting list for COVID 19 vaccine. Talked with client about COVID-19 vaccine sites in the area  Patient Self Care Activities:  . Self administers medications as prescribed . Attends all scheduled provider appointments   Plan:  Client to call [name] as needed to discuss mental health needs of client Client to attend scheduled medical appointments Client to use relaxation techniques of choice to help manage client stress/anxiety symptoms [name] to call client in next 4 weeks to talk with client about stress management for client  Initial goal documentation    .      Materials Provided: No  Follow Up Plan: [name] to call client in next 4 weeks to talk with client about stress management for client  The patient verbalized understanding of instructions provided today and declined a print copy of patient instruction materials.   [name].Makaylah Oddo MSW, [name] Licensed Clinical Social Worker [facility] Care Management [phone]

## 2019-06-15 ENCOUNTER — Ambulatory Visit (INDEPENDENT_AMBULATORY_CARE_PROVIDER_SITE_OTHER): Payer: Medicare PPO | Admitting: Licensed Clinical Social Worker

## 2019-06-15 DIAGNOSIS — F329 Major depressive disorder, single episode, unspecified: Secondary | ICD-10-CM

## 2019-06-15 DIAGNOSIS — E559 Vitamin D deficiency, unspecified: Secondary | ICD-10-CM

## 2019-06-15 DIAGNOSIS — E039 Hypothyroidism, unspecified: Secondary | ICD-10-CM

## 2019-06-15 DIAGNOSIS — F32A Depression, unspecified: Secondary | ICD-10-CM

## 2019-06-15 NOTE — Chronic Care Management (AMB) (Signed)
  Care Management Note   Amber Rivas is a 74 y.o. year old female who is a primary care patient of [name], DO. The CM team was consulted for assistance with chronic disease management and care coordination.   I reached out to [employer], spouse,  by phone today.   Review of patient status, including review of consultants reports, relevant laboratory and other test results, and collaboration with appropriate care team members and the patient's provider was performed as part of comprehensive patient evaluation and provision of chronic care management services.   Social determinants of health: risk of tobacco use; risk of depression; risk of stress    Office Visit from 02/13/2019 in [place]  PHQ-9 Total Score  1     GAD 7 : Generalized Anxiety Score 02/13/2019 09/05/2018 06/13/2018 06/03/2018  Nervous, Anxious, on Edge 0 3 2 3   Control/stop worrying 2 0 2 1  Worry too much - different things 2 0 2 1  Trouble relaxing 0 0 1 1  Restless 0 0 1 0  Easily annoyed or irritable 0 0 0 0  Afraid - awful might happen 1 0 0 0  Total GAD 7 Score 5 3 8 6   Anxiety Difficulty Not difficult at all - Very difficult -   Medications   (very important)  New medications from outside sources are available for reconciliation  acetaminophen (TYLENOL) 500 MG tablet aspirin EC 81 MG tablet aspirin-acetaminophen-caffeine (EXCEDRIN MIGRAINE) 250-250-65 MG tablet B-COMPLEX-C PO buPROPion (WELLBUTRIN) 75 MG tablet levothyroxine (SYNTHROID) 100 MCG tablet Multiple Vitamin (MULTIVITAMIN WITH MINERALS) TABS tablet Omega-3 Fatty Acids (OMEGA 3 PO)  Goals    . Client said:  " I need to talk with someone about stress, anxiety related to family members' health issues" (pt-stated)     Current Barriers:  [name] Mental Health Concerns  of client with chronic diagnoses of Depressive Disorder, Hypothyroidism, Vitamin D deficiency . Family members health issues of  concern  Clinical Social Work Clinical Goal(s):  [name] Over the next 30 days, client will work with [name] to address concerns related to stress concerning health needs of family members  Interventions:   [name] previously talkedwith client about relaxation techniques of choice for client  Previously talked with client about current needs of client  Previously talked with client about grief issues of client  Talked with Hubbard Robinson about ambulation challenges of client  Talked with Hubbard Robinson about client appointments [name] said client received COVID-19 vaccine today  Patient Self Care Activities:  . Self administers medications as prescribed . Attends all scheduled provider appointments   Plan:  Client to call [name] as needed to discuss mental health needs of client Client to attend scheduled medical appointments Client to use relaxation techniques of choice to help manage client stress/anxiety symptoms [name] to call client in next 4 weeks to talk with client about stress management for client  Initial goal documentation    Follow Up Plan: [name] to call client /spouse of client in next 4 weeks to talk with client Raechel Chute about stress management for client  [name].Damyan Corne MSW, [name] Licensed Clinical Social Worker [facility] Care Management [phone]

## 2019-06-15 NOTE — Patient Instructions (Addendum)
Licensed Clinical Social Worker Visit Information  Goals we discussed today:  Goals    . Client said:  " I need to talk with someone about stress, anxiety related to family members' health issues" (pt-stated)     Current Barriers:  [name] Mental Health Concerns  of client with chronic diagnoses of Depressive Disorder, Hypothyroidism, Vitamin D deficiency . Family members health issues of concern  Clinical Social Work Clinical Goal(s):  [name] Over the next 30 days, client will work with [name] to address concerns related to stress concerning health needs of family members  Interventions:  [name] previously talkedwith client about relaxation techniques of choice for client  Previously talked with client about current needs of client  Previously talked with client about grief issues of client  Talked with Hubbard Robinson about ambulation challenges of client  Talked with Hubbard Robinson about client appointments [name] said client received COVID-19 vaccine today  Patient Self Care Activities:  . Self administers medications as prescribed . Attends all scheduled provider appointments   Plan:  Client to call [name] as needed to discuss mental health needs of client Client to attend scheduled medical appointments Client to use relaxation techniques of choice to help manage client stress/anxiety symptoms [name] to call client in next 4 weeks to talk with client about stress management for client  Initial goal documentation    .    Materials Provided: No  Follow Up Plan: [name] to call client/spouse of client in next 4 weeks to talk with client /spouse about stress management of client  The patient/Charles Langhorst, spouse,  verbalized understanding of instructions provided today and declined a print copy of patient instruction materials.   [name].Eden Toohey MSW, [name] Licensed Clinical Social Worker [facility] Care Management [phone]

## 2019-07-15 ENCOUNTER — Ambulatory Visit (INDEPENDENT_AMBULATORY_CARE_PROVIDER_SITE_OTHER): Payer: Medicare PPO | Admitting: Licensed Clinical Social Worker

## 2019-07-15 DIAGNOSIS — F329 Major depressive disorder, single episode, unspecified: Secondary | ICD-10-CM | POA: Diagnosis not present

## 2019-07-15 DIAGNOSIS — E039 Hypothyroidism, unspecified: Secondary | ICD-10-CM

## 2019-07-15 DIAGNOSIS — E559 Vitamin D deficiency, unspecified: Secondary | ICD-10-CM

## 2019-07-15 DIAGNOSIS — F32A Depression, unspecified: Secondary | ICD-10-CM

## 2019-07-15 NOTE — Patient Instructions (Addendum)
Licensed Clinical Social Worker Visit Information  Goals we discussed today:  Goals    . Client said:  " I need to talk with someone about stress, anxiety related to family members' health issues" (pt-stated)     Current Barriers:  [name] Mental Health Concerns  of client with chronic diagnoses of Depressive Disorder, Hypothyroidism, Vitamin D deficiency . Family members health issues of concern  Clinical Social Work Clinical Goal(s):  [name] Over the next 30 days, client will work with [name] to address concerns related to stress concerning health needs of family members  Interventions: [name] talked with Hubbard Robinson about relaxation techniques of choice for client (client likes taking care of new pet dog) Talked with Hubbard Robinson about current needs of client  Talked with Hubbard Robinson about grief issues of client  Talked with Hubbard Robinson about ambulation challenges of client Talked with Hubbard Robinson about client's upcoming appointments  Talked with Hubbard Robinson about client nursing support with [facility]  Patient Self Care Activities:  . Self administers medications as prescribed . Attends all scheduled provider appointments   Plan:  Client to call [name] as needed to discuss mental health needs of client Client to attend scheduled medical appointments Client to use relaxation techniques of choice to help manage client stress/anxiety symptoms [name] to call client in next 4 weeks to talk with client about stress management for client  Initial goal documentation      Materials Provided: No  Follow Up Plan:  [name] to call client in next 4 weeks to talk with client about stress management for client  The patient/Charles Cuffee, spouse of client, verbalized understanding of instructions provided today and declined a print copy of patient instruction materials.   [name].Myliah Medel MSW, [name] Licensed Clinical Social Worker [facility] Care Management [phone]

## 2019-07-15 NOTE — Chronic Care Management (AMB) (Signed)
Chronic Care Management    Clinical Social Work Follow Up Note  07/15/2019 Name: MAHITA ROPE MRN: [id number] DOB: [birth year]  Latya Hendricksen Fazio is a 74 y.o. year old female who is a primary care patient of [name], DO. The CCM team was consulted for assistance with Mental Health Counseling and Resources.   Review of patient status, including review of consultants reports, other relevant assessments, and collaboration with appropriate care team members and the patient's provider was performed as part of comprehensive patient evaluation and provision of chronic care management services.    SDOH (Social Determinants of Health) assessments performed: Yes;risk for tobacco use; risk for depression    Office Visit from 02/13/2019 in [place]  PHQ-9 Total Score  1     GAD 7 : Generalized Anxiety Score 02/13/2019 09/05/2018 06/13/2018 06/03/2018  Nervous, Anxious, on Edge 0 3 2 3   Control/stop worrying 2 0 2 1  Worry too much - different things 2 0 2 1  Trouble relaxing 0 0 1 1  Restless 0 0 1 0  Easily annoyed or irritable 0 0 0 0  Afraid - awful might happen 1 0 0 0  Total GAD 7 Score 5 3 8 6   Anxiety Difficulty Not difficult at all - Very difficult -    Outpatient Encounter Medications as of 07/15/2019  Medication Sig  . acetaminophen (TYLENOL) 500 MG tablet Take 500 mg by mouth every 6 (six) hours as needed for mild pain or moderate pain.  [name] aspirin EC 81 MG tablet Take 81 mg by mouth daily as needed for mild pain or moderate pain.   [name] aspirin-acetaminophen-caffeine (EXCEDRIN MIGRAINE) 250-250-65 MG tablet Take 1 tablet by mouth every 6 (six) hours as needed for headache.   . B-COMPLEX-C PO Take 1 tablet by mouth daily.  [name] buPROPion (WELLBUTRIN) 75 MG tablet Take 1 tablet (75 mg total) by mouth 2 (two) times daily.  [name] levothyroxine (SYNTHROID) 100 MCG tablet TAKE 1 TABLET BY MOUTH DAILY  . Multiple Vitamin (MULTIVITAMIN WITH MINERALS) TABS tablet Take 1  tablet by mouth daily.  . Omega-3 Fatty Acids (OMEGA 3 PO) Take 1 capsule by mouth daily.    No facility-administered encounter medications on file as of 07/15/2019.    Goals    . Client said:  " I need to talk with someone about stress, anxiety related to family members' health issues" (pt-stated)     Current Barriers:  [name] Mental Health Concerns  of client with chronic diagnoses of Depressive Disorder, Hypothyroidism, Vitamin D deficiency . Family members health issues of concern  Clinical Social Work Clinical Goal(s):  [name] Over the next 30 days, client will work with [name] to address concerns related to stress concerning health needs of family members  Interventions:  [name] [place] about relaxation techniques of choice for client (client likes taking care of new pet dog)  Talked with Hubbard Robinson about current needs of client  Talked with Hubbard Robinson about grief issues of client  Talked with Hubbard Robinson about ambulation challenges of client  Talked with Hubbard Robinson about client's upcoming appointments  Talked with Hubbard Robinson about client nursing support with [place] Activities:  . Self administers medications as prescribed . Attends all scheduled provider appointments   Plan:  Client to call [name] as needed to discuss mental health needs of client Client to attend scheduled medical appointments Client to use relaxation techniques of choice to  help manage client stress/anxiety symptoms [name] to call client in next 4 weeks to talk with client about stress management for client  Initial goal documentation       Follow Up Plan: [name] to call client in next 4 weeks to talk with client about stress management for client  [name].Keyasia Jolliff MSW, [name] Licensed Clinical Social Worker [facility] Care Management [phone]

## 2019-08-14 ENCOUNTER — Telehealth: Payer: Self-pay

## 2019-09-21 ENCOUNTER — Ambulatory Visit: Payer: Self-pay | Admitting: Licensed Clinical Social Worker

## 2019-09-21 DIAGNOSIS — F32A Depression, unspecified: Secondary | ICD-10-CM

## 2019-09-21 DIAGNOSIS — E559 Vitamin D deficiency, unspecified: Secondary | ICD-10-CM

## 2019-09-21 DIAGNOSIS — E039 Hypothyroidism, unspecified: Secondary | ICD-10-CM

## 2019-09-21 NOTE — Patient Instructions (Addendum)
Licensed Clinical Social Worker Visit Information  Materials Provided: No  09/21/2019  Name: Amber Rivas           MRN: [id number]       DOB: 1945-05-31  Amber Rivas is a 74 y.o. year old female who is a primary care patient of Amber Norlander, DO. The CCM team was consulted for assistance with Amber Rivas .   Review of patient status, including review of consultants reports, other relevant assessments, and collaboration with appropriate care team members and the patient's provider was performed as part of comprehensive patient evaluation and provision of chronic care Rivas services.    SDOH (Social Determinants of Health) assessments performed: No;risk for tobacco use; risk for depression; risk for stress   [name] called client phone number several times today but [name] was not able to speak via phone with client today. However, [name] did leave phone message for client today requesting that Amber Rivas please return call to [name] at [phone]   Follow Up Plan: [name] to call client in next 4 week to talk with her about social work needs of client  [name] was not able to speak via phone with client today; thus, the patient was not able to  verbalize understanding of instructions provided today and was not able to accept or decline a print copy of patient instruction materials.   Amber Rivas.Amber Rivas MSW, [name] Licensed Clinical Social Worker Amber Rivas/Amber Rivas [phone]

## 2019-09-21 NOTE — Chronic Care Management (AMB) (Signed)
  Chronic Care Management    Clinical Social Work Follow Up Note  09/21/2019 Name: WINNONA WARGO MRN: [id number] DOB: 1946-02-13  Laurence Folz Grulke is a 74 y.o. year old female who is a primary care patient of [name], DO. The CCM team was consulted for assistance with [employer] .   Review of patient status, including review of consultants reports, other relevant assessments, and collaboration with appropriate care team members and the patient's provider was performed as part of comprehensive patient evaluation and provision of chronic care management services.    SDOH (Social Determinants of Health) assessments performed: No;risk for tobacco use; risk for depression; risk for stress    Office Visit from 02/13/2019 in [facility]  PHQ-9 Total Score 1     GAD 7 : Generalized Anxiety Score 02/13/2019 09/05/2018 06/13/2018 06/03/2018  Nervous, Anxious, on Edge 0 3 2 3   Control/stop worrying 2 0 2 1  Worry too much - different things 2 0 2 1  Trouble relaxing 0 0 1 1  Restless 0 0 1 0  Easily annoyed or irritable 0 0 0 0  Afraid - awful might happen 1 0 0 0  Total GAD 7 Score 5 3 8 6   Anxiety Difficulty Not difficult at all - Very difficult -    Outpatient Encounter Medications as of 09/21/2019  Medication Sig  . acetaminophen (TYLENOL) 500 MG tablet Take 500 mg by mouth every 6 (six) hours as needed for mild pain or moderate pain.  [name] aspirin EC 81 MG tablet Take 81 mg by mouth daily as needed for mild pain or moderate pain.   [name] aspirin-acetaminophen-caffeine (EXCEDRIN MIGRAINE) 250-250-65 MG tablet Take 1 tablet by mouth every 6 (six) hours as needed for headache.   . B-COMPLEX-C PO Take 1 tablet by mouth daily.  [name] buPROPion (WELLBUTRIN) 75 MG tablet Take 1 tablet (75 mg total) by mouth 2 (two) times daily.  [name] levothyroxine (SYNTHROID) 100 MCG tablet TAKE 1 TABLET BY MOUTH DAILY  . Multiple Vitamin (MULTIVITAMIN WITH MINERALS) TABS tablet Take 1  tablet by mouth daily.  . Omega-3 Fatty Acids (OMEGA 3 PO) Take 1 capsule by mouth daily.    No facility-administered encounter medications on file as of 09/21/2019.    [name] called client phone number several times today but [name] was not able to speak via phone with client today. However, [name] did leave phone message for client today requesting that Donnielle please return call to [name] at [phone]   Follow Up Plan: [name] to call client in next 4 week to talk with her about social work needs of client  [name].Clare Casto MSW, [name] Licensed Clinical Social Worker [facility] [phone]

## 2019-10-15 ENCOUNTER — Telehealth: Payer: Self-pay

## 2019-11-16 ENCOUNTER — Telehealth: Payer: Medicare PPO

## 2019-12-22 ENCOUNTER — Telehealth: Payer: Medicare PPO

## 2020-02-01 ENCOUNTER — Telehealth: Payer: Medicare PPO

## 2020-02-24 DIAGNOSIS — M25572 Pain in left ankle and joints of left foot: Secondary | ICD-10-CM | POA: Diagnosis not present

## 2020-02-24 DIAGNOSIS — M79671 Pain in right foot: Secondary | ICD-10-CM | POA: Diagnosis not present

## 2020-02-24 DIAGNOSIS — M6702 Short Achilles tendon (acquired), left ankle: Secondary | ICD-10-CM | POA: Diagnosis not present

## 2020-02-24 DIAGNOSIS — M79672 Pain in left foot: Secondary | ICD-10-CM | POA: Diagnosis not present

## 2020-02-24 DIAGNOSIS — M76822 Posterior tibial tendinitis, left leg: Secondary | ICD-10-CM | POA: Diagnosis not present

## 2020-03-03 ENCOUNTER — Other Ambulatory Visit: Payer: Self-pay | Admitting: Family Medicine

## 2020-03-03 DIAGNOSIS — E890 Postprocedural hypothyroidism: Secondary | ICD-10-CM

## 2020-03-03 NOTE — Telephone Encounter (Signed)
Attempted to contact patient - NA °

## 2020-03-03 NOTE — Telephone Encounter (Signed)
Needs to be seen

## 2020-03-14 ENCOUNTER — Emergency Department (HOSPITAL_COMMUNITY)
Admission: EM | Admit: 2020-03-14 | Discharge: 2020-03-14 | Disposition: A | Discharge status: Home / Self Care | Payer: Medicare PPO | Attending: Emergency Medicine | Admitting: Emergency Medicine

## 2020-03-14 ENCOUNTER — Other Ambulatory Visit: Payer: Self-pay

## 2020-03-14 ENCOUNTER — Emergency Department (HOSPITAL_COMMUNITY): Payer: Medicare PPO

## 2020-03-14 DIAGNOSIS — R072 Precordial pain: Secondary | ICD-10-CM | POA: Insufficient documentation

## 2020-03-14 DIAGNOSIS — Z20822 Contact with and (suspected) exposure to covid-19: Secondary | ICD-10-CM | POA: Diagnosis not present

## 2020-03-14 DIAGNOSIS — E039 Hypothyroidism, unspecified: Secondary | ICD-10-CM | POA: Diagnosis not present

## 2020-03-14 DIAGNOSIS — R079 Chest pain, unspecified: Secondary | ICD-10-CM | POA: Diagnosis not present

## 2020-03-14 LAB — CBC WITH DIFFERENTIAL/PLATELET
Abs Immature Granulocytes: 0.02 10*3/uL (ref 0.00–0.07)
Basophils Absolute: 0 10*3/uL (ref 0.0–0.1)
Basophils Relative: 1 %
Eosinophils Absolute: 0.2 10*3/uL (ref 0.0–0.5)
Eosinophils Relative: 4 %
HCT: 42.2 % (ref 36.0–46.0)
Hemoglobin: 13.8 g/dL (ref 12.0–15.0)
Immature Granulocytes: 0 %
Lymphocytes Relative: 30 %
Lymphs Abs: 2 10*3/uL (ref 0.7–4.0)
MCH: 29.2 pg (ref 26.0–34.0)
MCHC: 32.7 g/dL (ref 30.0–36.0)
MCV: 89.2 fL (ref 80.0–100.0)
Monocytes Absolute: 0.4 10*3/uL (ref 0.1–1.0)
Monocytes Relative: 6 %
Neutro Abs: 3.9 10*3/uL (ref 1.7–7.7)
Neutrophils Relative %: 59 %
Platelets: 317 10*3/uL (ref 150–400)
RBC: 4.73 MIL/uL (ref 3.87–5.11)
RDW: 13.6 % (ref 11.5–15.5)
WBC: 6.6 10*3/uL (ref 4.0–10.5)
nRBC: 0 % (ref 0.0–0.2)

## 2020-03-14 LAB — RESP PANEL BY RT-PCR (FLU A&B, COVID) ARPGX2
Influenza A by PCR: NEGATIVE
Influenza B by PCR: NEGATIVE
SARS Coronavirus 2 by RT PCR: NEGATIVE

## 2020-03-14 LAB — COMPREHENSIVE METABOLIC PANEL
ALT: 21 U/L (ref 0–44)
AST: 17 U/L (ref 15–41)
Albumin: 4.3 g/dL (ref 3.5–5.0)
Alkaline Phosphatase: 81 U/L (ref 38–126)
Anion gap: 9 (ref 5–15)
BUN: 13 mg/dL (ref 8–23)
CO2: 25 mmol/L (ref 22–32)
Calcium: 9.6 mg/dL (ref 8.9–10.3)
Chloride: 102 mmol/L (ref 98–111)
Creatinine, Ser: 0.61 mg/dL (ref 0.44–1.00)
GFR, Estimated: >60 mL/min (ref >60)
Glucose, Bld: 97 mg/dL (ref 70–99)
Potassium: 4.1 mmol/L (ref 3.5–5.1)
Sodium: 136 mmol/L (ref 135–145)
Total Bilirubin: 0.4 mg/dL (ref 0.3–1.2)
Total Protein: 7.6 g/dL (ref 6.5–8.1)

## 2020-03-14 LAB — TROPONIN I (HIGH SENSITIVITY)
Troponin I (High Sensitivity): 5 ng/L (ref <18)
Troponin I (High Sensitivity): 6 ng/L (ref <18)

## 2020-03-14 LAB — LIPASE, BLOOD: Lipase: 22 U/L (ref 11–51)

## 2020-03-14 MED ORDER — ASPIRIN 81 MG PO CHEW
162.0000 mg | CHEWABLE_TABLET | Freq: Once | ORAL | Status: AC
  Administered 2020-03-14: 12:00:00 162 mg via ORAL
  Filled 2020-03-14: qty 2

## 2020-03-14 NOTE — ED Provider Notes (Signed)
Emergency Department Provider Note   I have reviewed the triage vital signs and the nursing notes.   HISTORY  Chief Complaint Chest Pain   HPI Amber Rivas is a 74 y.o. female with past medical history reviewed below presents to the emergency department with chest discomfort starting yesterday.  She describes a heaviness to the center/left upper chest which radiates slightly to her shoulder.  No clear modifying or provoking factors.  She is not feeling especially short of breath.  No pleuritic component to pain.  Denies any upper respiratory infection symptoms.  No nausea, vomiting, diaphoresis.  No similar pain in the past.  No clear association with food.  She took 2 baby aspirin this morning with no relief in pain and ultimately presented for ED evaluation when symptoms persisted.   Past Medical History:  Diagnosis Date  . Arthritis    foot  . Headache    Migraines  . History of hiatal hernia    slight noted on imaging  . Hyperthyroidism   . Pneumonia    history of    Patient Active Problem List   Diagnosis Date Noted  . Stress and adjustment reaction 09/05/2018  . Depressive disorder 09/05/2018  . Vitamin D deficiency 03/13/2017  . Hypothyroidism 03/13/2017  . Mass of thyroid region 02/07/2017  . History of hiatal hernia   . Multinodular goiter s/p total thyroidectomy 02/07/2017 12/24/2016    Past Surgical History:  Procedure Laterality Date  . CHOLECYSTECTOMY  2005  . COLONOSCOPY N/A 04/28/2015   Procedure: COLONOSCOPY;  Surgeon: Rogene Houston, MD;  [facility];  Service: Endoscopy;  Laterality: N/A;  730  . right ankle surgery    . THYROIDECTOMY Left 02/07/2017   Procedure: TOTAL THYROIDECTOMY;  Surgeon: Michael Boston, MD;  Location: [facility];  Service: General;  Laterality: Left;  GENERAL AND LOCAL     Allergies Sulfur  Family History  Problem Relation Age of Onset  . Colon cancer Brother   . Cancer Brother   . Lung disease Sister   .  Hypothyroidism Sister   . Cancer Brother     Social History Social History   Tobacco Use  . Smoking status: Never Smoker  . Smokeless tobacco: Never Used  Vaping Use  . Vaping Use: Never used  Substance Use Topics  . Alcohol use: Yes    Comment: rare  . Drug use: No    Review of Systems  Constitutional: No fever/chills Eyes: No visual changes. ENT: No sore throat. Cardiovascular: Positive chest pain. Respiratory: Denies shortness of breath. Gastrointestinal: No abdominal pain.  No nausea, no vomiting.  No diarrhea.  No constipation. Genitourinary: Negative for dysuria. Musculoskeletal: Negative for back pain. Skin: Negative for rash. Neurological: Negative for headaches, focal weakness or numbness.  10-point ROS otherwise negative.  ____________________________________________   PHYSICAL EXAM:  VITAL SIGNS: ED Triage Vitals  Enc Vitals Group     BP 03/14/20 1100 (!) 184/96     Pulse Rate 03/14/20 1100 75     Resp 03/14/20 1100 18     Temp 03/14/20 1100 98.2 F (36.8 C)     Temp Source 03/14/20 1100 Oral     SpO2 03/14/20 1100 92 %     Weight --      Height 03/14/20 1105 5' 4.5" (1.638 m)   Constitutional: Alert and oriented. Well appearing and in no acute distress. Eyes: Conjunctivae are normal.  Head: Atraumatic. Nose: No congestion/rhinnorhea. Mouth/Throat: Mucous membranes are moist.  Neck: No stridor.  Cardiovascular: Normal rate, regular rhythm. Good peripheral circulation. Grossly normal heart sounds.   Respiratory: Normal respiratory effort.  No retractions. Lungs CTAB. Gastrointestinal: Soft and nontender. No RUQ or epigastric tenderness. No distention.  Musculoskeletal: No lower extremity tenderness nor edema. No gross deformities of extremities. Neurologic:  Normal speech and language. No gross focal neurologic deficits are appreciated.  Skin:  Skin is warm, dry and intact. No rash noted.  ____________________________________________    LABS (all labs ordered are listed, but only abnormal results are displayed)  Labs Reviewed  RESP PANEL BY RT-PCR (FLU A&B, COVID) [id number]  COMPREHENSIVE METABOLIC PANEL  LIPASE, BLOOD  CBC WITH DIFFERENTIAL/PLATELET  TROPONIN I (HIGH SENSITIVITY)  TROPONIN I (HIGH SENSITIVITY)   ____________________________________________  EKG   EKG Interpretation  Date/Time:  Monday March 14 2020 11:27:58 EST Ventricular Rate:  72 PR Interval:    QRS Duration: 102 QT Interval:  438 QTC Calculation: 480 R Axis:   -5 Text Interpretation: Sinus rhythm Borderline repolarization abnormality No significant change since last tracing No STEMI Confirmed by Nanda Quinton [phone]) on 03/14/2020 11:31:39 AM       ____________________________________________  RADIOLOGY  DG Chest Portable 1 View  Result Date: 03/14/2020 CLINICAL DATA:  Chest pain that began this morning, pending COVID test EXAM: PORTABLE CHEST 1 VIEW COMPARISON:  04/30/2016 FINDINGS: Trachea midline. Cardiomediastinal contours and hilar structures are normal. Partially obscured LEFT hemidiaphragm. Persistent elevation of the RIGHT hemidiaphragm. On limited assessment no acute skeletal process. IMPRESSION: Partially obscured LEFT hemidiaphragm may represent atelectasis or developing infection. Chronic elevation of the RIGHT hemidiaphragm. Electronically Signed   By: Zetta Bills M.D.   On: 03/14/2020 12:32    ____________________________________________   PROCEDURES  Procedure(s) performed:   Procedures  None  ____________________________________________   INITIAL IMPRESSION / ASSESSMENT AND PLAN / ED COURSE  Pertinent labs & imaging results that were available during my care of the patient were reviewed by me and considered in my medical decision making (see chart for details).   Patient presents to the emergency department for evaluation of chest discomfort starting yesterday.  EKG shows some nonspecific ST changes  that were actually present in tracings in the past.  No acute ischemic findings on EKG interpreted by me as above.  Chest x-ray reviewed showing some atelectasis at the left base.  Doubt infection/pneumonia with no active symptoms here.  My suspicion for PE is very low.  Patient has normal vital signs and is otherwise low risk for PE.  No pleuritic component to pain.  ACS is my primary consideration.  Troponins negative x2.  Patient's heart score low-moderate risk.  Plan for close PCP follow-up.  I have also placed a referral for cardiology.  Patient will call to confirm her appointment tomorrow.  Discussed ED return precautions with new or worsening pain symptoms.   ____________________________________________  FINAL CLINICAL IMPRESSION(S) / ED DIAGNOSES  Final diagnoses:  Precordial chest pain     MEDICATIONS GIVEN DURING THIS VISIT:  Medications  aspirin chewable tablet 162 mg (162 mg Oral Given 03/14/20 1144)    Note:  This document was prepared using Dragon voice recognition software and may include unintentional dictation errors.  Nanda Quinton, MD, [facility]    Embree Brawley, [facility], MD 03/15/20 0830

## 2020-03-14 NOTE — ED Triage Notes (Signed)
Chest pain onset this am. 

## 2020-03-14 NOTE — Discharge Instructions (Signed)

## 2020-03-17 ENCOUNTER — Telehealth: Payer: Self-pay | Admitting: Family Medicine

## 2020-03-17 ENCOUNTER — Other Ambulatory Visit: Payer: Self-pay | Admitting: Family Medicine

## 2020-03-17 DIAGNOSIS — E890 Postprocedural hypothyroidism: Secondary | ICD-10-CM

## 2020-03-17 NOTE — Telephone Encounter (Signed)
°  Prescription Request  03/17/2020  What is the name of the medication or equipment? levothyroxine  Have you contacted your pharmacy to request a refill? (if applicable) yes  Which pharmacy would you like this sent to? [facility]   Patient notified that their request is being sent to the clinical staff for review and that they should receive a response within 2 business days.

## 2020-03-18 MED ORDER — LEVOTHYROXINE SODIUM 100 MCG PO TABS
ORAL_TABLET | ORAL | 0 refills | Status: DC
Start: 2020-03-18 — End: 2020-04-26

## 2020-03-21 NOTE — Telephone Encounter (Signed)
LMOVM refill sent to pharmacy 

## 2020-04-07 ENCOUNTER — Encounter: Payer: Self-pay | Admitting: *Deleted

## 2020-04-07 ENCOUNTER — Ambulatory Visit: Payer: Medicare PPO | Admitting: Cardiology

## 2020-04-07 ENCOUNTER — Telehealth: Payer: Self-pay | Admitting: Cardiology

## 2020-04-07 ENCOUNTER — Encounter: Payer: Self-pay | Admitting: Cardiology

## 2020-04-07 VITALS — BP 156/90 | HR 65 | Ht 64.5 in | Wt 191.0 lb

## 2020-04-07 DIAGNOSIS — R030 Elevated blood-pressure reading, without diagnosis of hypertension: Secondary | ICD-10-CM | POA: Diagnosis not present

## 2020-04-07 DIAGNOSIS — Z01810 Encounter for preprocedural cardiovascular examination: Secondary | ICD-10-CM

## 2020-04-07 DIAGNOSIS — E039 Hypothyroidism, unspecified: Secondary | ICD-10-CM

## 2020-04-07 DIAGNOSIS — Z87898 Personal history of other specified conditions: Secondary | ICD-10-CM

## 2020-04-07 DIAGNOSIS — R079 Chest pain, unspecified: Secondary | ICD-10-CM

## 2020-04-07 NOTE — Telephone Encounter (Signed)
Pre-cert Verification for the following procedure    Lexiscan Myoview (URGENT-TOMORROW PER [facility]) dx: chest pain & surgical clearance  DATE:  04/08/2020  LOCATION: [facility]

## 2020-04-07 NOTE — Patient Instructions (Addendum)

## 2020-04-07 NOTE — Progress Notes (Signed)
Cardiology Office Note  Date: 04/07/2020   ID: Amber Rivas, Amber Rivas 08-Sep-1945, MRN [id number]  PCP:  [name], DO  Cardiologist:  Rozann Lesches, MD Electrophysiologist:  None   Chief Complaint  Patient presents with  . Chest Pain  . Preoperative cardiac evaluation    History of Present Illness: Amber Rivas is a 75 y.o. female referred for cardiology consultation by Dr. Laverta Baltimore for the evaluation of chest pain.  She was seen in the [facility] on December 20, I reviewed the notes.  ECG showed nonspecific ST segment changes.  Chest x-ray reported possible left basilar atelectasis versus developing infiltrate, patient afebrile at the time and without leukocytosis.  High-sensitivity troponin I levels were normal.  She tells me that she had been working on a recliner with her husband, began to experience a pressure-like sensation later in her left chest, ultimately went into her shoulder.  Symptoms lasted for a total of 4 hours and resolved spontaneously, she was only given aspirin.  Since that time she has had no recurrent symptoms, reports no change in baseline stamina, no progressive cough or fevers.  As it turns out, she is also scheduled to undergo left gastroc resection and calcaneal osteotomy under general anesthesia with Dr. Doran Durand on January 18.  Urgent preoperative cardiac clearance has been requested as well.  She reports no family history of premature CAD.  No known major baseline cardiac risk factors.  She does have hypothyroidism.  Blood pressure elevated today but no standing history of hypertension.  LDL was 110 in 2018.  She has never undergone any prior ischemic evaluation.  Past Medical History:  Diagnosis Date  . Arthritis   . History of hiatal hernia   . History of pneumonia   . Hyperthyroidism   . Migraine     Past Surgical History:  Procedure Laterality Date  . ANKLE SURGERY Right   . CHOLECYSTECTOMY  2005  . COLONOSCOPY N/A 04/28/2015   Procedure:  COLONOSCOPY;  Surgeon: Rogene Houston, MD;  [facility];  Service: Endoscopy;  Laterality: N/A;  730  . THYROIDECTOMY Left 02/07/2017   Procedure: TOTAL THYROIDECTOMY;  Surgeon: Michael Boston, MD;  Location: [facility];  Service: General;  Laterality: Left;  GENERAL AND LOCAL     Current Outpatient Medications  Medication Sig Dispense Refill  . acetaminophen (TYLENOL) 500 MG tablet Take 500 mg by mouth every 6 (six) hours as needed for mild pain or moderate pain.    [name] aspirin EC 81 MG tablet Take 81 mg by mouth daily as needed for mild pain or moderate pain.     . B-COMPLEX-C PO Take 1 tablet by mouth daily.    [name] buPROPion (WELLBUTRIN) 75 MG tablet Take 1 tablet (75 mg total) by mouth 2 (two) times daily. (Patient taking differently: Take 75 mg by mouth as needed.) 180 tablet 3  . levothyroxine (SYNTHROID) 100 MCG tablet TAKE 1 TABLET BY MOUTH DAILY 30 tablet 0  . Multiple Vitamin (MULTIVITAMIN WITH MINERALS) TABS tablet Take 1 tablet by mouth daily.    . Omega-3 Fatty Acids (OMEGA 3 PO) Take 1 capsule by mouth daily.      No current facility-administered medications for this visit.   Allergies:  Sulfur   Social History: The patient  reports that she has never smoked. She has never used smokeless tobacco. She reports current alcohol use. She reports that she does not use drugs.   Family History: The patient's  family history includes Cancer in her brother and brother; Colon cancer in her brother; Hypothyroidism in her sister; Lung disease in her sister.   ROS: Chronic foot pain.  Intermittent sinus drainage and congestion.  Physical Exam: VS:  BP (!) 156/90   Pulse 65   Ht 5' 4.5" (1.638 m)   Wt 191 lb (86.6 kg)   SpO2 96%   BMI 32.28 kg/m , BMI Body mass index is 32.28 kg/m.  Wt Readings from Last 3 Encounters:  04/07/20 191 lb (86.6 kg)  02/13/19 190 lb (86.2 kg)  09/05/18 192 lb (87.1 kg)    General: Patient appears comfortable at rest. HEENT: Conjunctiva and lids  normal, wearing a mask. Neck: Supple, no elevated JVP or carotid bruits, no thyromegaly. Lungs: Clear to auscultation, nonlabored breathing at rest. Cardiac: Regular rate and rhythm, no S3 or significant systolic murmur, no pericardial rub. Abdomen: Soft, bowel sounds present, no guarding or rebound. Extremities: No pitting edema, distal pulses 2+. Skin: Warm and dry. Musculoskeletal: No kyphosis. Neuropsychiatric: Alert and oriented x3, affect grossly appropriate.  ECG:  An ECG dated 03/14/2020 was personally reviewed today and demonstrated:  Sinus rhythm with nonspecific ST segment changes.  Recent Labwork: 03/14/2020: ALT 21; AST 17; BUN 13; Creatinine, Ser 0.61; Hemoglobin 13.8; Platelets 317; Potassium 4.1; Sodium 136     Component Value Date/Time   CHOL 209 (H) 11/12/2016 1018   TRIG 166 (H) 11/12/2016 1018   HDL 66 11/12/2016 1018   CHOLHDL 3.2 11/12/2016 1018   LDLCALC 110 (H) 11/12/2016 1018    Other Studies Reviewed Today:  Chest x-ray 03/14/2020: IMPRESSION: Partially obscured LEFT hemidiaphragm may represent atelectasis or developing infection.  Chronic elevation of the RIGHT hemidiaphragm.  Assessment and Plan:  1. History of chest pain in a 75 year old woman with hypothyroidism, blood pressure elevated today but no standing history of hypertension, last LDL 110.  ECG nonspecific and high-sensitivity troponin I levels normal at recent ER visit at [facility].  She has not undergone any prior ischemic testing.  Concurrently, urgent preoperative cardiac clearance has been requested prior to planned left gastroc resection and calcaneal osteotomy under general anesthesia by Dr. Doran Durand on January 18.  Plan is to proceed with a Lexiscan Myoview which will be added onto the schedule tomorrow.  Further recommendations to follow.  2. Elevated blood pressure measurement today.  No standing history of hypertension.  She does have an automatic cuff at home.  I asked her to check  blood pressure periodically and continue to follow-up with her PCP in case antihypertensive therapy needs to be considered.  Also discussed weight loss and salt restriction.  3. Hypothyroidism, on Synthroid.  Follows with PCP.  Medication Adjustments/Labs and Tests Ordered: Current medicines are reviewed at length with the patient today.  Concerns regarding medicines are outlined above.   Tests Ordered: Orders Placed This Encounter  Procedures  . NM Myocar Multi W/Spect W/Wall Motion / EF    Medication Changes: No orders of the defined types were placed in this encounter.   Disposition:  Follow up test results.  Signed, Satira Sark, MD, [facility] 04/07/2020 [facility] [place], [place], [place] [address] Phone: [phone]; Fax: [phone]

## 2020-04-08 ENCOUNTER — Telehealth: Payer: Self-pay | Admitting: *Deleted

## 2020-04-08 ENCOUNTER — Encounter (HOSPITAL_COMMUNITY)
Admission: RE | Admit: 2020-04-08 | Discharge: 2020-04-08 | Disposition: A | Discharge status: Home / Self Care | Payer: Medicare PPO | Source: Ambulatory Visit | Attending: Cardiology | Admitting: Cardiology

## 2020-04-08 ENCOUNTER — Encounter (HOSPITAL_COMMUNITY): Payer: Self-pay

## 2020-04-08 ENCOUNTER — Ambulatory Visit (HOSPITAL_COMMUNITY)
Admission: RE | Admit: 2020-04-08 | Discharge: 2020-04-08 | Disposition: A | Discharge status: Home / Self Care | Payer: Medicare PPO | Source: Ambulatory Visit | Attending: Cardiology | Admitting: Cardiology

## 2020-04-08 ENCOUNTER — Other Ambulatory Visit: Payer: Self-pay

## 2020-04-08 DIAGNOSIS — R079 Chest pain, unspecified: Secondary | ICD-10-CM | POA: Diagnosis not present

## 2020-04-08 DIAGNOSIS — Z01810 Encounter for preprocedural cardiovascular examination: Secondary | ICD-10-CM | POA: Diagnosis not present

## 2020-04-08 HISTORY — DX: Malignant (primary) neoplasm, unspecified: C80.1

## 2020-04-08 LAB — NM MYOCAR MULTI W/SPECT W/WALL MOTION / EF
LV dias vol: 58 mL (ref 46–106)
LV sys vol: 13 mL
Peak HR: 89 {beats}/min
RATE: 0.37
Rest HR: 74 {beats}/min
SDS: 1
SRS: 1
SSS: 2
TID: 0.99

## 2020-04-08 MED ORDER — TECHNETIUM TC 99M TETROFOSMIN IV KIT
10.0000 | PACK | Freq: Once | INTRAVENOUS | Status: AC | PRN
  Administered 2020-04-08: 10.3 via INTRAVENOUS

## 2020-04-08 MED ORDER — SODIUM CHLORIDE FLUSH 0.9 % IV SOLN
INTRAVENOUS | Status: AC
  Administered 2020-04-08: 10 mL via INTRAVENOUS
  Filled 2020-04-08: qty 10

## 2020-04-08 MED ORDER — TECHNETIUM TC 99M TETROFOSMIN IV KIT
30.0000 | PACK | Freq: Once | INTRAVENOUS | Status: AC | PRN
  Administered 2020-04-08: 32 via INTRAVENOUS

## 2020-04-08 MED ORDER — REGADENOSON 0.4 MG/5ML IV SOLN
INTRAVENOUS | Status: AC
  Administered 2020-04-08: 0.4 mg via INTRAVENOUS
  Filled 2020-04-08: qty 5

## 2020-04-08 NOTE — Telephone Encounter (Signed)
Pt voiced understanding - stress test results with Dr Chuck Hint notes and surgical clearance form faxed to [facility] of [place]

## 2020-04-08 NOTE — Telephone Encounter (Signed)
-----   Message from Satira Sark, MD sent at 04/08/2020  3:38 PM EST ----- Results reviewed.  Please let her know that the stress test was normal, argues against obstructive CAD as cause of her symptoms.  She should be able to proceed with planned left gastroc resection and calcaneal osteotomy under general anesthesia at an acceptable perioperative cardiac risk.  We will fax the information back with the preoperative clearance form today.

## 2020-04-11 ENCOUNTER — Ambulatory Visit: Payer: Medicare PPO | Admitting: Cardiology

## 2020-04-12 DIAGNOSIS — S93422A Sprain of deltoid ligament of left ankle, initial encounter: Secondary | ICD-10-CM | POA: Diagnosis not present

## 2020-04-12 DIAGNOSIS — M76822 Posterior tibial tendinitis, left leg: Secondary | ICD-10-CM | POA: Diagnosis not present

## 2020-04-12 DIAGNOSIS — M6702 Short Achilles tendon (acquired), left ankle: Secondary | ICD-10-CM | POA: Diagnosis not present

## 2020-04-12 DIAGNOSIS — G8918 Other acute postprocedural pain: Secondary | ICD-10-CM | POA: Diagnosis not present

## 2020-04-20 ENCOUNTER — Encounter (INDEPENDENT_AMBULATORY_CARE_PROVIDER_SITE_OTHER): Payer: Self-pay | Admitting: *Deleted

## 2020-04-25 DIAGNOSIS — M76822 Posterior tibial tendinitis, left leg: Secondary | ICD-10-CM | POA: Diagnosis not present

## 2020-04-25 DIAGNOSIS — Z4889 Encounter for other specified surgical aftercare: Secondary | ICD-10-CM | POA: Diagnosis not present

## 2020-04-26 ENCOUNTER — Other Ambulatory Visit: Payer: Self-pay

## 2020-04-26 ENCOUNTER — Ambulatory Visit: Payer: Medicare PPO | Admitting: Family Medicine

## 2020-04-26 VITALS — BP 149/89 | HR 81 | Temp 96.9°F

## 2020-04-26 DIAGNOSIS — M79672 Pain in left foot: Secondary | ICD-10-CM

## 2020-04-26 DIAGNOSIS — E890 Postprocedural hypothyroidism: Secondary | ICD-10-CM

## 2020-04-26 DIAGNOSIS — E559 Vitamin D deficiency, unspecified: Secondary | ICD-10-CM | POA: Diagnosis not present

## 2020-04-26 DIAGNOSIS — F339 Major depressive disorder, recurrent, unspecified: Secondary | ICD-10-CM

## 2020-04-26 DIAGNOSIS — F439 Reaction to severe stress, unspecified: Secondary | ICD-10-CM | POA: Diagnosis not present

## 2020-04-26 DIAGNOSIS — Z9889 Other specified postprocedural states: Secondary | ICD-10-CM

## 2020-04-26 MED ORDER — BUPROPION HCL 75 MG PO TABS: 75.0000 mg | ORAL_TABLET | Freq: Two times a day (BID) | ORAL | 3 refills | Status: DC

## 2020-04-26 MED ORDER — LEVOTHYROXINE SODIUM 100 MCG PO TABS: ORAL_TABLET | ORAL | 3 refills | Status: DC

## 2020-04-26 MED ORDER — TRAMADOL HCL 50 MG PO TABS: 50.0000 mg | ORAL_TABLET | Freq: Four times a day (QID) | ORAL | 0 refills | Status: AC | PRN

## 2020-04-26 NOTE — Progress Notes (Signed)
Subjective: CC: Post operative hypothyroidism, depression, vit d deficiency PCP: [name], DO Amber Rivas is a 75 y.o. female presenting to clinic today for:  1. Hypothyroidism, post-op Denies any change in voice, heart palpitations or tremoring.  She does report some intermittent increase of anxiety.  The Wellbutrin seems to relieve this.  Last dose of Synthroid was today.  No use of biotin-containing products  2. Depressive disorder Stable with intermittent use of Wellbutrin.  She did have some flares after the passing of her brothers.  She had 1 brother that was in hospice care for a while and passed shortly around the time of our last visit.  She had another brother passed away unexpectedly from gallbladder problems less than a couple weeks later.  Most recently she had another, her very youngest and last living brother, passed away from complications of alcoholism and liver cancer.  Her son, who struggles with schizophrenia and bipolar disorder is currently residing with them.  3. Vit D deficiency/recent surgery Recent foot/tendon surgery.  On over-the-counter vitamins.  She is currently being treated with tramadol but she has 1 pill left.  Her next visit is not for 2 weeks with her orthopedist.  She continues to take Advil and Tylenol scheduled as well every 4 hours.  She is currently in a cast but plan to transition over to a postop boot in about 2 weeks.  No current physical therapy   ROS: Per HPI  Allergies  Allergen Reactions  . Elemental Sulfur Anaphylaxis   Past Medical History:  Diagnosis Date  . Arthritis   . Cancer ([place])    Thyroid  . History of hiatal hernia   . History of pneumonia   . Hyperthyroidism   . Migraine     Current Outpatient Medications:  .  acetaminophen (TYLENOL) 500 MG tablet, Take 500 mg by mouth every 6 (six) hours as needed for mild pain or moderate pain., Disp: , Rfl:  .  aspirin EC 81 MG tablet, Take 81 mg by mouth daily as  needed for mild pain or moderate pain. , Disp: , Rfl:  .  B-COMPLEX-C PO, Take 1 tablet by mouth daily., Disp: , Rfl:  .  buPROPion (WELLBUTRIN) 75 MG tablet, Take 1 tablet (75 mg total) by mouth 2 (two) times daily. (Patient taking differently: Take 75 mg by mouth as needed.), Disp: 180 tablet, Rfl: 3 .  levothyroxine (SYNTHROID) 100 MCG tablet, TAKE 1 TABLET BY MOUTH DAILY, Disp: 30 tablet, Rfl: 0 .  Multiple Vitamin (MULTIVITAMIN WITH MINERALS) TABS tablet, Take 1 tablet by mouth daily., Disp: , Rfl:  .  Omega-3 Fatty Acids (OMEGA 3 PO), Take 1 capsule by mouth daily. , Disp: , Rfl:  Social History   Socioeconomic History  . Marital status: Married    Spouse name: [name]  . Number of children: 4  . Years of education: Bachelors Degree  . Highest education level: Bachelor's degree (e.g., BA, AB, BS)  Occupational History  . Occupation: Retired    Fish farm manager: [employer]  Tobacco Use  . Smoking status: Never Smoker  . Smokeless tobacco: Never Used  Vaping Use  . Vaping Use: Never used  Substance and Sexual Activity  . Alcohol use: Yes    Comment: rare  . Drug use: No  . Sexual activity: Yes    Birth control/protection: Post-menopausal  Other Topics Concern  . Not on file  Social History Narrative  . Not on file   Social  Determinants of Health   Financial Resource Strain: Not on file  Food Insecurity: Not on file  Transportation Needs: Not on file  Physical Activity: Not on file  Stress: Not on file  Social Connections: Not on file  Intimate Partner Violence: Not on file   Family History  Problem Relation Age of Onset  . Colon cancer Brother   . Cancer Brother   . Lung disease Sister   . Hypothyroidism Sister   . Cancer Brother     Objective: Office vital signs reviewed. BP (!) 149/89   Pulse 81   Temp (!) 96.9 F (36.1 C) (Temporal)   SpO2 93%   Physical Examination:  General: Awake, alert, well nourished, No acute distress HEENT: Normal;  sclera white.  No exophthalmos.  No goiter Cardio: regular rate and rhythm, S1S2 heard, no murmurs appreciated Pulm: clear to auscultation bilaterally, no wheezes, rhonchi or rales; normal work of breathing on room air Extremities: warm, well perfused, No edema, cyanosis or clubbing; +2 pulses bilaterally MSK: Arrives in wheelchair.  Left foot is in a cast.  She is able to wiggle her toes on the left side Skin: dry; intact; no rashes or lesions; normal temperature Neuro: No tremor Psych: Mood stable, speech normal, affect appropriate Depression screen [facility] 2/9 02/13/2019 02/09/2019 09/10/2018 09/05/2018 06/13/2018  Decreased Interest 0 1 1 0 1  Down, Depressed, Hopeless 1 1 1  0 1  PHQ - 2 Score 1 2 2  0 2  Altered sleeping 0 1 1 3  0  Tired, decreased energy 0 1 1 3 1   Change in appetite 0 0 0 1 0  Feeling bad or failure about yourself  0 0 0 0 0  Trouble concentrating 0 0 0 0 1  Moving slowly or fidgety/restless 0 0 0 0 0  Suicidal thoughts 0 0 0 0 0  PHQ-9 Score 1 4 4 7 4   Difficult doing work/chores Not difficult at all Somewhat difficult Somewhat difficult Somewhat difficult Somewhat difficult  Some recent data might be hidden     Assessment/ Plan: 75 y.o. female   Postoperative hypothyroidism - Plan: Thyroid Panel With TSH, levothyroxine (SYNTHROID) 100 MCG tablet  Vitamin D deficiency - Plan: VITAMIN D 25 Hydroxy (Vit-D Deficiency, Fractures)  S/P foot surgery - Plan: traMADol (ULTRAM) 50 MG tablet  Left foot pain - Plan: traMADol (ULTRAM) 50 MG tablet  Depression, recurrent (HCC) - Plan: buPROPion (WELLBUTRIN) 75 MG tablet  Stress at home - Plan: buPROPion (WELLBUTRIN) 75 MG tablet  Asymptomatic from a thyroid standpoint She is had some situational flares and depression and anxiety and have encouraged her to continue taking Wellbutrin twice daily as prescribed.  Renewal of that medicine has been sent  She is status post foot surgery and having some breakthrough pain with  only 1 tablet of tramadol left.  I have renewed this at every 6 hour usage.  Advised to continue Tylenol and Motrin.  Follow-up with surgeon as directed.  She will follow with me in 6 months for full physical unless she needs me sooner  The Narcotic Database has been reviewed.  There were no red flags.     No orders of the defined types were placed in this encounter.  No orders of the defined types were placed in this encounter.    [name], DO [place] [phone]

## 2020-04-27 LAB — VITAMIN D 25 HYDROXY (VIT D DEFICIENCY, FRACTURES): Vit D, 25-Hydroxy: 39.2 ng/mL (ref 30.0–100.0)

## 2020-04-27 LAB — THYROID PANEL WITH TSH
Free Thyroxine Index: 1.5 (ref 1.2–4.9)
T3 Uptake Ratio: 26 % (ref 24–39)
T4, Total: 5.7 ug/dL (ref 4.5–12.0)
TSH: 9.49 u[IU]/mL — ABNORMAL HIGH (ref 0.450–4.500)

## 2020-04-29 ENCOUNTER — Telehealth: Payer: Self-pay

## 2020-05-02 ENCOUNTER — Other Ambulatory Visit: Payer: Self-pay | Admitting: Family Medicine

## 2020-05-02 DIAGNOSIS — Z9889 Other specified postprocedural states: Secondary | ICD-10-CM

## 2020-05-02 DIAGNOSIS — M79672 Pain in left foot: Secondary | ICD-10-CM

## 2020-05-05 ENCOUNTER — Ambulatory Visit (INDEPENDENT_AMBULATORY_CARE_PROVIDER_SITE_OTHER): Payer: Medicare PPO

## 2020-05-05 DIAGNOSIS — Z0000 Encounter for general adult medical examination without abnormal findings: Secondary | ICD-10-CM | POA: Diagnosis not present

## 2020-05-05 NOTE — [facility] Notes (Signed)
MEDICARE ANNUAL WELLNESS VISIT  05/05/2020  Telephone Visit Disclaimer This Medicare AWV was conducted by telephone due to national recommendations for restrictions regarding the COVID-19 Pandemic (e.g. social distancing).  I verified, using two identifiers, that I am speaking with Amber Rivas or their authorized healthcare agent. I discussed the limitations, risks, security, and privacy concerns of performing an evaluation and management service by telephone and the potential availability of an in-person appointment in the future. The patient expressed understanding and agreed to proceed.  Location of Patient: Home Location of Provider (nurse):  [facility]  Subjective:    Amber Rivas is a 75 y.o. female patient of [name], DO who had a Medicare Annual Wellness Visit today via telephone. Amber Rivas is Retired and lives with their spouse. She has one child and two grandchildren. She reports that she is socially active and does interact with friends/family regularly. She is minimally physically active and enjoys photography, reading and crafts.  Patient Care Team: [name], DO as PCP - General (Family Medicine) Satira Sark, MD as PCP - Cardiology (Cardiology) Renato Shin, MD as Consulting Physician (Endocrinology) Wylene Simmer, MD as Consulting Physician (Orthopedic Surgery) Michael Boston, MD as Consulting Physician (General Surgery)  Advanced Directives 05/05/2020 03/14/2020 02/07/2017 02/04/2017 04/30/2016 08/11/2015 04/28/2015  Does Patient Have a Medical Advance Directive? Yes Yes Yes Yes Yes Yes Yes  Type of Paramedic of [place];Living will - [place];Living will [place];Living will Living will - Living will  Does patient want to make changes to medical advance directive? No - Patient declined - No - Patient declined - - - -  Copy of [place] in Chart? No - copy requested - -  - - - [facility] Utilization Over the Past 12 Months: # of hospitalizations or ER visits: 1 # of surgeries: 1  Review of Systems    Patient reports that her overall health is unchanged compared to last year.  History obtained from chart review and the patient  Patient Reported Readings (BP, Pulse, CBG, Weight, etc) none  Pain Assessment Pain : 0-10 Pain Type: Acute pain Pain Location: Ankle Pain Orientation: Left Pain Radiating Towards: Tramadol Pain Descriptors / Indicators: Aching,Dull Pain Onset: 1 to 4 weeks ago Pain Frequency: Several days a week Pain Relieving Factors: Tramadol  Pain Relieving Factors: Tramadol  Current Medications & Allergies (verified) Allergies as of 05/05/2020      Reactions   Elemental Sulfur Anaphylaxis      Medication List       Accurate as of May 05, 2020  4:18 PM. If you have any questions, ask your nurse or doctor.        acetaminophen 500 MG tablet Commonly known as: TYLENOL Take 500 mg by mouth every 6 (six) hours as needed for mild pain or moderate pain.   aspirin EC 81 MG tablet Take 81 mg by mouth daily as needed for mild pain or moderate pain.   B-COMPLEX-C PO Take 1 tablet by mouth daily.   buPROPion 75 MG tablet Commonly known as: WELLBUTRIN Take 1 tablet (75 mg total) by mouth 2 (two) times daily.   cholecalciferol 25 MCG (1000 UNIT) tablet Commonly known as: VITAMIN D3 Take 1,000 Units by mouth daily.   co-enzyme Q-10 30 MG capsule Take 30 mg by mouth 3 (three) times daily.   levothyroxine 100 MCG tablet Commonly known as: SYNTHROID TAKE 1  TABLET BY MOUTH DAILY   methocarbamol 500 MG tablet Commonly known as: ROBAXIN methocarbamol 500 mg tablet  Take 1 tablet(s) every 6 hours by oral route.   multivitamin with minerals Tabs tablet Take 1 tablet by mouth daily.   OMEGA 3 PO Take 1 capsule by mouth daily.   oxyCODONE 5 MG immediate release tablet Commonly known as: Oxy  IR/ROXICODONE oxycodone 5 mg tablet  TAKE 1 TABLET BY MOUTH EVERY 4 HOURS AS NEEDED FOR THREE DAYS   traMADol 50 MG tablet Commonly known as: ULTRAM tramadol 50 mg tablet  TAKE 1 TABLET BY MOUTH EVERY 4 HOURS AS NEEDED       History (reviewed): Past Medical History:  Diagnosis Date  . Arthritis   . Cancer ([place])    Thyroid  . History of hiatal hernia   . History of pneumonia   . Hyperthyroidism   . Migraine    Past Surgical History:  Procedure Laterality Date  . ANKLE SURGERY Right   . CHOLECYSTECTOMY  2005  . COLONOSCOPY N/A 04/28/2015   Procedure: COLONOSCOPY;  Surgeon: Rogene Houston, MD;  Location[facility];  Service: Endoscopy;  Laterality: N/A;  730  . THYROIDECTOMY Left 02/07/2017   Procedure: TOTAL THYROIDECTOMY;  Surgeon: Michael Boston, MD;  Location: [facility] ORS;  Service: General;  Laterality: Left;  GENERAL AND LOCAL    Family History  Problem Relation Age of Onset  . Colon cancer Brother   . Cancer Brother   . Lung disease Sister   . Hypothyroidism Sister   . Cancer Brother    Social History   Socioeconomic History  . Marital status: Married    Spouse name: Amber Rivas  . Number of children: 4  . Years of education: Bachelors Degree  . Highest education level: Bachelor's degree (e.g., BA, AB, BS)  Occupational History  . Occupation: Retired    Fish farm manager: [employer]  Tobacco Use  . Smoking status: Never Smoker  . Smokeless tobacco: Never Used  Vaping Use  . Vaping Use: Never used  Substance and Sexual Activity  . Alcohol use: Yes    Comment: rare  . Drug use: No  . Sexual activity: Yes    Birth control/protection: Post-menopausal  Other Topics Concern  . Not on file  Social History Narrative  . Not on file   Social Determinants of Health   Financial Resource Strain: Not on file  Food Insecurity: Not on file  Transportation Needs: Not on file  Physical Activity: Not on file  Stress: Not on file  Social Connections: Not on  file    Activities of Daily Living In your present state of health, do you have any difficulty performing the following activities: 05/05/2020  Hearing? N  Vision? N  Difficulty concentrating or making decisions? N  Walking or climbing stairs? N  Dressing or bathing? N  Doing errands, shopping? N  Preparing Food and eating ? N  Using the Toilet? N  In the past six months, have you accidently leaked urine? N  Do you have problems with loss of bowel control? N  Managing your Medications? N  Managing your Finances? N  Housekeeping or managing your Housekeeping? N  Some recent data might be hidden    Patient Education/ Literacy How often do you need to have someone help you when you read instructions, pamphlets, or other written materials from your doctor or pharmacy?: 1 - Never What is the last grade level you completed in school?: Bachelor's  degree  Exercise Current Exercise Habits: The patient does not participate in regular exercise at present, Exercise limited by: None identified  Diet Patient reports consuming 3 meals a day and 0 snack(s) a day Patient reports that her primary diet is: Regular Patient reports that she does have regular access to food.   Depression Screen PHQ 2/9 Scores 05/05/2020 02/13/2019 02/09/2019 09/10/2018 09/05/2018 06/13/2018 06/03/2018  PHQ - 2 Score 1 1 2 2  0 2 1  PHQ- 9 Score - 1 4 4 7 4 1      Fall Risk Fall Risk  05/05/2020 04/26/2020 06/03/2018 12/11/2017 12/10/2017  Falls in the past year? 1 1 0 No No  Number falls in past yr: 1 1 - - -  Injury with Fall? 0 0 - - -  Risk for fall due to : History of fall(s) History of fall(s) - - -  Follow up Falls evaluation completed Falls prevention discussed - - -     Objective:  [place] seemed alert and oriented and she participated appropriately during our telephone visit.  Blood Pressure Weight BMI  BP Readings from Last 3 Encounters:  04/26/20 (!) 149/89  04/07/20 (!) 156/90  03/14/20 (!)  152/81   Wt Readings from Last 3 Encounters:  04/07/20 191 lb (86.6 kg)  02/13/19 190 lb (86.2 kg)  09/05/18 192 lb (87.1 kg)   BMI Readings from Last 1 Encounters:  04/07/20 32.28 kg/m    *Unable to obtain current vital signs, weight, and BMI due to telephone visit type  Hearing/Vision  . Sujata did not seem to have difficulty with hearing/understanding during the telephone conversation . Reports that she has not had a formal eye exam by an eye care professional within the past year . Reports that she has not had a formal hearing evaluation within the past year *Unable to fully assess hearing and vision during telephone visit type  Cognitive Function: 6CIT Screen 05/05/2020  What Year? 0 points  What month? 0 points  What time? 0 points  Count back from 20 0 points  Months in reverse 0 points  Repeat phrase 0 points  Total Score 0   (Normal:0-7, Significant for Dysfunction: >8)  Normal Cognitive Function Screening: Yes   Immunization & Health Maintenance Record Immunization History  Administered Date(s) Administered  . Influenza, High Dose Seasonal PF 02/10/2019  . Influenza,inj,Quad PF,6+ Mos 01/14/2017  . Influenza,inj,quad, With Preservative 02/12/2017  . Moderna Sars-Covid-2 Vaccination 05/20/2019, 06/15/2019, 02/09/2020  . Pneumococcal Conjugate-13 02/13/2019    Health Maintenance  Topic Date Due  . Hepatitis C Screening  Never done  . MAMMOGRAM  Never done  . INFLUENZA VACCINE  10/25/2019  . PNA vac Low Risk Adult (2 of 2 - PPSV23) 02/13/2020  . TETANUS/TDAP  08/24/2021  . COLONOSCOPY (Pts 45-46yrs Insurance coverage will need to be confirmed)  02/01/2026  . DEXA SCAN  Completed  . COVID-19 Vaccine  Completed       Assessment  This is a routine wellness examination for [employer].  Health Maintenance: Due or Overdue Health Maintenance Due  Topic Date Due  . Hepatitis C Screening  Never done  . MAMMOGRAM  Never done  . INFLUENZA VACCINE   10/25/2019  . PNA vac Low Risk Adult (2 of 2 - PPSV23) 02/13/2020    Amber Rivas does not need a referral for Community Assistance: Care Management:   no Social Work:    no Prescription Assistance:  no Nutrition/Diabetes Education:  no  Plan:  Personalized Goals Goals Addressed            This Visit's Progress   . Patient Stated       05/05/2020 AWV Goal: Exercise for General Health   Patient will verbalize understanding of the benefits of increased physical activity:  Exercising regularly is important. It will improve your overall fitness, flexibility, and endurance.  Regular exercise also will improve your overall health. It can help you control your weight, reduce stress, and improve your bone density.  Over the next year, patient will increase physical activity as tolerated with a goal of at least 150 minutes of moderate physical activity per week.   You can tell that you are exercising at a moderate intensity if your heart starts beating faster and you start breathing faster but can still hold a conversation.  Moderate-intensity exercise ideas include:  Walking 1 mile (1.6 km) in about 15 minutes  Biking  Hiking  Golfing  Dancing  Water aerobics  Patient will verbalize understanding of everyday activities that increase physical activity by providing examples like the following: ? Yard work, such as: ? Pushing a Conservation officer, nature ? Raking and bagging leaves ? Washing your car ? Pushing a stroller ? Shoveling snow ? Gardening ? Washing windows or floors  Patient will be able to explain general safety guidelines for exercising:   Before you start a new exercise program, talk with your health care provider.  Do not exercise so much that you hurt yourself, feel dizzy, or get very short of breath.  Wear comfortable clothes and wear shoes with good support.  Drink plenty of water while you exercise to prevent dehydration or heat stroke.  Work out until your  breathing and your heartbeat get faster.       Personalized Health Maintenance & Screening Recommendations  Pneumococcal vaccine  Influenza vaccine Td vaccine Screening mammography  Lung Cancer Screening Recommended: no (Low Dose CT Chest recommended if Age 100-80 years, 30 pack-year currently smoking OR have quit w/in past 15 years) Hepatitis C Screening recommended: yes HIV Screening recommended: no  Advanced Directives: Written information was not prepared per patient's request.  Referrals & Orders No orders of the defined types were placed in this encounter.   Follow-up Plan . Follow-up with [name], DO as planned . Schedule mammogram   I have personally reviewed and noted the following in the patient's chart:   . Medical and social history . Use of alcohol, tobacco or illicit drugs  . Current medications and supplements . Functional ability and status . Nutritional status . Physical activity . Advanced directives . List of other physicians . Hospitalizations, surgeries, and ER visits in previous 12 months . Vitals . Screenings to include cognitive, depression, and falls . Referrals and appointments  In addition, I have reviewed and discussed with Amber Rivas certain preventive protocols, quality metrics, and best practice recommendations. A written personalized care plan for preventive services as well as general preventive health recommendations is available and can be mailed to the patient at her request.      Felicity Coyer, LPN     [id number]/41/[id number]  Patient declined after visit summary

## 2020-05-09 DIAGNOSIS — M76822 Posterior tibial tendinitis, left leg: Secondary | ICD-10-CM | POA: Diagnosis not present

## 2020-05-09 DIAGNOSIS — Z4889 Encounter for other specified surgical aftercare: Secondary | ICD-10-CM | POA: Diagnosis not present

## 2020-05-17 DIAGNOSIS — M76822 Posterior tibial tendinitis, left leg: Secondary | ICD-10-CM | POA: Diagnosis not present

## 2020-05-23 DIAGNOSIS — M76822 Posterior tibial tendinitis, left leg: Secondary | ICD-10-CM | POA: Diagnosis not present

## 2020-05-23 DIAGNOSIS — Z4889 Encounter for other specified surgical aftercare: Secondary | ICD-10-CM | POA: Diagnosis not present

## 2020-06-20 DIAGNOSIS — M76822 Posterior tibial tendinitis, left leg: Secondary | ICD-10-CM | POA: Diagnosis not present

## 2020-06-20 DIAGNOSIS — Z4889 Encounter for other specified surgical aftercare: Secondary | ICD-10-CM | POA: Diagnosis not present

## 2020-06-24 DIAGNOSIS — R262 Difficulty in walking, not elsewhere classified: Secondary | ICD-10-CM | POA: Diagnosis not present

## 2020-06-24 DIAGNOSIS — M25671 Stiffness of right ankle, not elsewhere classified: Secondary | ICD-10-CM | POA: Diagnosis not present

## 2020-06-24 DIAGNOSIS — M25572 Pain in left ankle and joints of left foot: Secondary | ICD-10-CM | POA: Diagnosis not present

## 2020-06-24 DIAGNOSIS — M6281 Muscle weakness (generalized): Secondary | ICD-10-CM | POA: Diagnosis not present

## 2020-06-28 DIAGNOSIS — M25671 Stiffness of right ankle, not elsewhere classified: Secondary | ICD-10-CM | POA: Diagnosis not present

## 2020-06-28 DIAGNOSIS — M6281 Muscle weakness (generalized): Secondary | ICD-10-CM | POA: Diagnosis not present

## 2020-06-28 DIAGNOSIS — M25572 Pain in left ankle and joints of left foot: Secondary | ICD-10-CM | POA: Diagnosis not present

## 2020-06-28 DIAGNOSIS — R262 Difficulty in walking, not elsewhere classified: Secondary | ICD-10-CM | POA: Diagnosis not present

## 2020-06-30 DIAGNOSIS — M25572 Pain in left ankle and joints of left foot: Secondary | ICD-10-CM | POA: Diagnosis not present

## 2020-06-30 DIAGNOSIS — R262 Difficulty in walking, not elsewhere classified: Secondary | ICD-10-CM | POA: Diagnosis not present

## 2020-06-30 DIAGNOSIS — M6281 Muscle weakness (generalized): Secondary | ICD-10-CM | POA: Diagnosis not present

## 2020-06-30 DIAGNOSIS — M25671 Stiffness of right ankle, not elsewhere classified: Secondary | ICD-10-CM | POA: Diagnosis not present

## 2020-07-01 DIAGNOSIS — M25572 Pain in left ankle and joints of left foot: Secondary | ICD-10-CM | POA: Diagnosis not present

## 2020-07-01 DIAGNOSIS — M6281 Muscle weakness (generalized): Secondary | ICD-10-CM | POA: Diagnosis not present

## 2020-07-01 DIAGNOSIS — R262 Difficulty in walking, not elsewhere classified: Secondary | ICD-10-CM | POA: Diagnosis not present

## 2020-07-01 DIAGNOSIS — M25671 Stiffness of right ankle, not elsewhere classified: Secondary | ICD-10-CM | POA: Diagnosis not present

## 2020-07-04 DIAGNOSIS — M25671 Stiffness of right ankle, not elsewhere classified: Secondary | ICD-10-CM | POA: Diagnosis not present

## 2020-07-04 DIAGNOSIS — M25572 Pain in left ankle and joints of left foot: Secondary | ICD-10-CM | POA: Diagnosis not present

## 2020-07-04 DIAGNOSIS — M6281 Muscle weakness (generalized): Secondary | ICD-10-CM | POA: Diagnosis not present

## 2020-07-04 DIAGNOSIS — R262 Difficulty in walking, not elsewhere classified: Secondary | ICD-10-CM | POA: Diagnosis not present

## 2020-07-06 DIAGNOSIS — M25572 Pain in left ankle and joints of left foot: Secondary | ICD-10-CM | POA: Diagnosis not present

## 2020-07-06 DIAGNOSIS — R262 Difficulty in walking, not elsewhere classified: Secondary | ICD-10-CM | POA: Diagnosis not present

## 2020-07-06 DIAGNOSIS — M6281 Muscle weakness (generalized): Secondary | ICD-10-CM | POA: Diagnosis not present

## 2020-07-06 DIAGNOSIS — M25671 Stiffness of right ankle, not elsewhere classified: Secondary | ICD-10-CM | POA: Diagnosis not present

## 2020-07-08 DIAGNOSIS — R262 Difficulty in walking, not elsewhere classified: Secondary | ICD-10-CM | POA: Diagnosis not present

## 2020-07-08 DIAGNOSIS — M25671 Stiffness of right ankle, not elsewhere classified: Secondary | ICD-10-CM | POA: Diagnosis not present

## 2020-07-08 DIAGNOSIS — M6281 Muscle weakness (generalized): Secondary | ICD-10-CM | POA: Diagnosis not present

## 2020-07-08 DIAGNOSIS — M25572 Pain in left ankle and joints of left foot: Secondary | ICD-10-CM | POA: Diagnosis not present

## 2020-07-13 DIAGNOSIS — R262 Difficulty in walking, not elsewhere classified: Secondary | ICD-10-CM | POA: Diagnosis not present

## 2020-07-13 DIAGNOSIS — M25671 Stiffness of right ankle, not elsewhere classified: Secondary | ICD-10-CM | POA: Diagnosis not present

## 2020-07-13 DIAGNOSIS — M25572 Pain in left ankle and joints of left foot: Secondary | ICD-10-CM | POA: Diagnosis not present

## 2020-07-13 DIAGNOSIS — M6281 Muscle weakness (generalized): Secondary | ICD-10-CM | POA: Diagnosis not present

## 2020-07-15 DIAGNOSIS — M6281 Muscle weakness (generalized): Secondary | ICD-10-CM | POA: Diagnosis not present

## 2020-07-15 DIAGNOSIS — R262 Difficulty in walking, not elsewhere classified: Secondary | ICD-10-CM | POA: Diagnosis not present

## 2020-07-15 DIAGNOSIS — M25572 Pain in left ankle and joints of left foot: Secondary | ICD-10-CM | POA: Diagnosis not present

## 2020-07-15 DIAGNOSIS — M25671 Stiffness of right ankle, not elsewhere classified: Secondary | ICD-10-CM | POA: Diagnosis not present

## 2020-07-19 DIAGNOSIS — Z23 Encounter for immunization: Secondary | ICD-10-CM | POA: Diagnosis not present

## 2020-07-20 DIAGNOSIS — M25572 Pain in left ankle and joints of left foot: Secondary | ICD-10-CM | POA: Diagnosis not present

## 2020-07-20 DIAGNOSIS — R262 Difficulty in walking, not elsewhere classified: Secondary | ICD-10-CM | POA: Diagnosis not present

## 2020-07-20 DIAGNOSIS — M6281 Muscle weakness (generalized): Secondary | ICD-10-CM | POA: Diagnosis not present

## 2020-07-20 DIAGNOSIS — M25671 Stiffness of right ankle, not elsewhere classified: Secondary | ICD-10-CM | POA: Diagnosis not present

## 2020-07-21 DIAGNOSIS — M79672 Pain in left foot: Secondary | ICD-10-CM | POA: Diagnosis not present

## 2020-07-21 DIAGNOSIS — M76822 Posterior tibial tendinitis, left leg: Secondary | ICD-10-CM | POA: Diagnosis not present

## 2020-07-22 DIAGNOSIS — R262 Difficulty in walking, not elsewhere classified: Secondary | ICD-10-CM | POA: Diagnosis not present

## 2020-07-22 DIAGNOSIS — M25671 Stiffness of right ankle, not elsewhere classified: Secondary | ICD-10-CM | POA: Diagnosis not present

## 2020-07-22 DIAGNOSIS — M6281 Muscle weakness (generalized): Secondary | ICD-10-CM | POA: Diagnosis not present

## 2020-07-22 DIAGNOSIS — M25572 Pain in left ankle and joints of left foot: Secondary | ICD-10-CM | POA: Diagnosis not present

## 2020-07-28 DIAGNOSIS — R262 Difficulty in walking, not elsewhere classified: Secondary | ICD-10-CM | POA: Diagnosis not present

## 2020-07-28 DIAGNOSIS — M25572 Pain in left ankle and joints of left foot: Secondary | ICD-10-CM | POA: Diagnosis not present

## 2020-07-28 DIAGNOSIS — M6281 Muscle weakness (generalized): Secondary | ICD-10-CM | POA: Diagnosis not present

## 2020-07-28 DIAGNOSIS — M25671 Stiffness of right ankle, not elsewhere classified: Secondary | ICD-10-CM | POA: Diagnosis not present

## 2020-08-04 DIAGNOSIS — H2513 Age-related nuclear cataract, bilateral: Secondary | ICD-10-CM | POA: Diagnosis not present

## 2020-08-08 DIAGNOSIS — M25671 Stiffness of right ankle, not elsewhere classified: Secondary | ICD-10-CM | POA: Diagnosis not present

## 2020-08-08 DIAGNOSIS — M25572 Pain in left ankle and joints of left foot: Secondary | ICD-10-CM | POA: Diagnosis not present

## 2020-08-08 DIAGNOSIS — R262 Difficulty in walking, not elsewhere classified: Secondary | ICD-10-CM | POA: Diagnosis not present

## 2020-08-08 DIAGNOSIS — M6281 Muscle weakness (generalized): Secondary | ICD-10-CM | POA: Diagnosis not present

## 2020-08-24 DIAGNOSIS — M79672 Pain in left foot: Secondary | ICD-10-CM | POA: Diagnosis not present

## 2020-08-24 DIAGNOSIS — M25572 Pain in left ankle and joints of left foot: Secondary | ICD-10-CM | POA: Diagnosis not present

## 2020-08-24 DIAGNOSIS — M76822 Posterior tibial tendinitis, left leg: Secondary | ICD-10-CM | POA: Diagnosis not present

## 2020-10-14 ENCOUNTER — Telehealth: Payer: Self-pay | Admitting: Family Medicine

## 2020-10-14 NOTE — Telephone Encounter (Signed)
  Prescription Request  10/14/2020  What is the name of the medication or equipment? levothyroxine (SYNTHROID) 100 MCG tablet  Have you contacted your pharmacy to request a refill? (if applicable) no  Which pharmacy would you like this sent to? [facility]  Pt has apt with Lajuana Ripple 10/24/2020  Patient notified that their request is being sent to the clinical staff for review and that they should receive a response within 2 business days.

## 2020-10-14 NOTE — Telephone Encounter (Signed)
Per our records, patient should have refills.  Spoke with patient, she checked her prescription bottle and she does so she will contact pharmacy for refills.

## 2020-10-24 ENCOUNTER — Ambulatory Visit: Payer: Medicare PPO | Admitting: Family Medicine

## 2020-10-24 ENCOUNTER — Encounter: Payer: Self-pay | Admitting: Family Medicine

## 2020-10-24 ENCOUNTER — Other Ambulatory Visit: Payer: Self-pay

## 2020-10-24 VITALS — BP 139/96 | HR 85 | Temp 97.1°F | Ht 64.5 in | Wt 187.6 lb

## 2020-10-24 DIAGNOSIS — E890 Postprocedural hypothyroidism: Secondary | ICD-10-CM

## 2020-10-24 DIAGNOSIS — F32A Depression, unspecified: Secondary | ICD-10-CM

## 2020-10-24 DIAGNOSIS — F439 Reaction to severe stress, unspecified: Secondary | ICD-10-CM | POA: Diagnosis not present

## 2020-10-24 DIAGNOSIS — F329 Major depressive disorder, single episode, unspecified: Secondary | ICD-10-CM

## 2020-10-24 DIAGNOSIS — Z23 Encounter for immunization: Secondary | ICD-10-CM

## 2020-10-24 DIAGNOSIS — Z0001 Encounter for general adult medical examination with abnormal findings: Secondary | ICD-10-CM | POA: Diagnosis not present

## 2020-10-24 DIAGNOSIS — R030 Elevated blood-pressure reading, without diagnosis of hypertension: Secondary | ICD-10-CM | POA: Diagnosis not present

## 2020-10-24 DIAGNOSIS — Z0000 Encounter for general adult medical examination without abnormal findings: Secondary | ICD-10-CM

## 2020-10-24 NOTE — Patient Instructions (Signed)
You had labs performed today.  You will be contacted with the results of the labs once they are available, usually in the next 3 business days for routine lab work.  If you have an active my chart account, they will be released to your MyChart.  If you prefer to have these labs released to you via telephone, please let us know.  If you had a pap smear or biopsy performed, expect to be contacted in about 7-10 days.  Preventive Care 7 Years and Older, Female Preventive care refers to lifestyle choices and visits with your health care provider that can promote health and wellness. This includes: A yearly physical exam. This is also called an annual wellness visit. Regular dental and eye exams. Immunizations. Screening for certain conditions. Healthy lifestyle choices, such as: Eating a healthy diet. Getting regular exercise. Not using drugs or products that contain nicotine and tobacco. Limiting alcohol use. What can I expect for my preventive care visit? Physical exam Your health care provider will check your: Height and weight. These may be used to calculate your BMI (body mass index). BMI is a measurement that tells if you are at a healthy weight. Heart rate and blood pressure. Body temperature. Skin for abnormal spots. Counseling Your health care provider may ask you questions about your: Past medical problems. Family's medical history. Alcohol, tobacco, and drug use. Emotional well-being. Home life and relationship well-being. Sexual activity. Diet, exercise, and sleep habits. History of falls. Memory and ability to understand (cognition). Work and work Statistician. Pregnancy and menstrual history. Access to firearms. What immunizations do I need?  Vaccines are usually given at various ages, according to a schedule. Your health care provider will recommend vaccines for you based on your age, medicalhistory, and lifestyle or other factors, such as travel or where you  work. What tests do I need? Blood tests Lipid and cholesterol levels. These may be checked every 5 years, or more often depending on your overall health. Hepatitis C test. Hepatitis B test. Screening Lung cancer screening. You may have this screening every year starting at age 63 if you have a 30-pack-year history of smoking and currently smoke or have quit within the past 15 years. Colorectal cancer screening. All adults should have this screening starting at age 40 and continuing until age 56. Your health care provider may recommend screening at age 44 if you are at increased risk. You will have tests every 1-10 years, depending on your results and the type of screening test. Diabetes screening. This is done by checking your blood sugar (glucose) after you have not eaten for a while (fasting). You may have this done every 1-3 years. Mammogram. This may be done every 1-2 years. Talk with your health care provider about how often you should have regular mammograms. Abdominal aortic aneurysm (AAA) screening. You may need this if you are a current or former smoker. BRCA-related cancer screening. This may be done if you have a family history of breast, ovarian, tubal, or peritoneal cancers. Other tests STD (sexually transmitted disease) testing, if you are at risk. Bone density scan. This is done to screen for osteoporosis. You may have this done starting at age 76. Talk with your health care provider about your test results, treatment options,and if necessary, the need for more tests. Follow these instructions at home: Eating and drinking  Eat a diet that includes fresh fruits and vegetables, whole grains, lean protein, and low-fat dairy products. Limit your intake of foods with  high amounts of sugar, saturated fats, and salt. Take vitamin and mineral supplements as recommended by your health care provider. Do not drink alcohol if your health care provider tells you not to drink. If you  drink alcohol: Limit how much you have to 0-1 drink a day. Be aware of how much alcohol is in your drink. In the [place] one drink equals one 12 oz bottle of beer (355 mL), one 5 oz glass of wine (148 mL), or one 1 oz glass of hard liquor (44 mL).  Lifestyle Take daily care of your teeth and gums. Brush your teeth every morning and night with fluoride toothpaste. Floss one time each day. Stay active. Exercise for at least 30 minutes 5 or more days each week. Do not use any products that contain nicotine or tobacco, such as cigarettes, e-cigarettes, and chewing tobacco. If you need help quitting, ask your health care provider. Do not use drugs. If you are sexually active, practice safe sex. Use a condom or other form of protection in order to prevent STIs (sexually transmitted infections). Talk with your health care provider about taking a low-dose aspirin or statin. Find healthy ways to cope with stress, such as: Meditation, yoga, or listening to music. Journaling. Talking to a trusted person. Spending time with friends and family. Safety Always wear your seat belt while driving or riding in a vehicle. Do not drive: If you have been drinking alcohol. Do not ride with someone who has been drinking. When you are tired or distracted. While texting. Wear a helmet and other protective equipment during sports activities. If you have firearms in your house, make sure you follow all gun safety procedures. What's next? Visit your health care provider once a year for an annual wellness visit. Ask your health care provider how often you should have your eyes and teeth checked. Stay up to date on all vaccines. This information is not intended to replace advice given to you by your health care provider. Make sure you discuss any questions you have with your healthcare provider. Document Revised: 03/02/2020 Document Reviewed: 03/06/2018 Elsevier Patient Education  2022 [employer].

## 2020-10-24 NOTE — Progress Notes (Signed)
Amber Rivas is a 75 y.o. female presents to office today for annual physical exam examination.    Concerns today include: 1.  None  Occupation: retired, Marital status: married, son Amber Rivas currently residing with patietn, Substance use: none Diet: fair, Exercise: no structured Last eye exam: UTD Last dental exam: UTD Last colonoscopy: UTD Last mammogram: UTD Last pap smear: n/a Refills needed today: none Immunizations needed: Shingles and pneumonia to be administered today Immunization History  Administered Date(s) Administered   Influenza, High Dose Seasonal PF 02/10/2019   Influenza,inj,Quad PF,6+ Mos 01/14/2017   Influenza,inj,quad, With Preservative 02/12/2017   Moderna Sars-Covid-2 Vaccination 05/20/2019, 06/15/2019, 02/09/2020   Pneumococcal Conjugate-13 02/13/2019   Pneumococcal Polysaccharide-23 10/24/2020   Zoster Recombinat (Shingrix) 10/24/2020     Past Medical History:  Diagnosis Date   Arthritis    Cancer ([place])    Thyroid   History of hiatal hernia    History of pneumonia    Hyperthyroidism    Migraine    Social History   Socioeconomic History   Marital status: Married    Spouse name: Amber Rivas   Number of children: 4   Years of education: Bachelors Degree   Highest education level: Bachelor's degree (e.g., BA, AB, BS)  Occupational History   Occupation: Retired    Fish farm manager: [employer]  Tobacco Use   Smoking status: Never   Smokeless tobacco: Never  Vaping Use   Vaping Use: Never used  Substance and Sexual Activity   Alcohol use: Yes    Comment: rare   Drug use: No   Sexual activity: Yes    Birth control/protection: Post-menopausal  Other Topics Concern   Not on file  Social History Narrative   Not on file   Social Determinants of Health   Financial Resource Strain: Not on file  Food Insecurity: Not on file  Transportation Needs: Not on file  Physical Activity: Not on file  Stress: Not on file  Social Connections:  Not on file  Intimate Partner Violence: Not on file   Past Surgical History:  Procedure Laterality Date   ANKLE SURGERY Right    CHOLECYSTECTOMY  2005   COLONOSCOPY N/A 04/28/2015   Procedure: COLONOSCOPY;  Surgeon: Rogene Houston, MD;  Location: [facility];  Service: Endoscopy;  Laterality: N/A;  730   THYROIDECTOMY Left 02/07/2017   Procedure: TOTAL THYROIDECTOMY;  Surgeon: Michael Boston, MD;  Location: [facility];  Service: General;  Laterality: Left;  GENERAL AND LOCAL    Family History  Problem Relation Age of Onset   Colon cancer Brother    Cancer Brother    Lung disease Sister    Hypothyroidism Sister    Cancer Brother     Current Outpatient Medications:    acetaminophen (TYLENOL) 500 MG tablet, Take 500 mg by mouth every 6 (six) hours as needed for mild pain or moderate pain., Disp: , Rfl:    aspirin EC 81 MG tablet, Take 81 mg by mouth daily as needed for mild pain or moderate pain. , Disp: , Rfl:    B-COMPLEX-C PO, Take 1 tablet by mouth daily., Disp: , Rfl:    buPROPion (WELLBUTRIN) 75 MG tablet, Take 1 tablet (75 mg total) by mouth 2 (two) times daily., Disp: 180 tablet, Rfl: 3   cholecalciferol (VITAMIN D3) 25 MCG (1000 UNIT) tablet, Take 1,000 Units by mouth daily., Disp: , Rfl:    co-enzyme Q-10 30 MG capsule, Take 30 mg by mouth 3 (three) times daily.,  Disp: , Rfl:    levothyroxine (SYNTHROID) 100 MCG tablet, TAKE 1 TABLET BY MOUTH DAILY, Disp: 90 tablet, Rfl: 3   Multiple Vitamin (MULTIVITAMIN WITH MINERALS) TABS tablet, Take 1 tablet by mouth daily., Disp: , Rfl:    Omega-3 Fatty Acids (OMEGA 3 PO), Take 1 capsule by mouth daily. , Disp: , Rfl:   Allergies  Allergen Reactions   Elemental Sulfur Anaphylaxis     ROS: Review of Systems Pertinent items noted in HPI and remainder of comprehensive ROS otherwise negative.    Physical exam BP (!) 139/96   Pulse 85   Temp (!) 97.1 F (36.2 C)   Ht 5' 4.5" (1.638 m)   Wt 187 lb 9.6 oz (85.1 kg)   SpO2 96%   BMI  31.70 kg/m  General appearance: alert, cooperative, appears stated age, and no distress Head: Normocephalic, without obvious abnormality, atraumatic Eyes: negative findings: lids and lashes normal, conjunctivae and sclerae normal, corneas clear, and pupils equal, round, reactive to light and accomodation Ears: normal TM's and external ear canals both ears Nose: Nares normal. Septum midline. Mucosa normal. No drainage or sinus tenderness. Throat: lips, mucosa, and tongue normal; teeth and gums normal Neck: no adenopathy, no carotid bruit, and supple, symmetrical, trachea midline Back: symmetric, no curvature. ROM normal. No CVA tenderness. Lungs: clear to auscultation bilaterally Heart: regular rate and rhythm, S1, S2 normal, no murmur, click, rub or gallop Abdomen: soft, non-tender; bowel sounds normal; no masses,  no organomegaly Extremities: extremities normal, atraumatic, no cyanosis or edema Pulses: 2+ and symmetric Skin: Skin color, texture, turgor normal. No rashes or lesions Lymph nodes: Cervical, supraclavicular, and axillary nodes normal. Neurologic: Grossly normal Psych: Mood stable, speech normal.  Becomes anxious when discussing Roe vs Wade Depression screen [facility] 2/9 05/05/2020 02/13/2019 02/09/2019  Decreased Interest 0 0 1  Down, Depressed, Hopeless '1 1 1  '$ PHQ - 2 Score '1 1 2  '$ Altered sleeping - 0 1  Tired, decreased energy - 0 1  Change in appetite - 0 0  Feeling bad or failure about yourself  - 0 0  Trouble concentrating - 0 0  Moving slowly or fidgety/restless - 0 0  Suicidal thoughts - 0 0  PHQ-9 Score - 1 4  Difficult doing work/chores - Not difficult at all Somewhat difficult  Some recent data might be hidden   GAD 7 : Generalized Anxiety Score 02/13/2019 09/05/2018 06/13/2018 06/03/2018  Nervous, Anxious, on Edge 0 '3 2 3  '$ Control/stop worrying 2 0 2 1  Worry too much - different things 2 0 2 1  Trouble relaxing 0 0 1 1  Restless 0 0 1 0  Easily annoyed or  irritable 0 0 0 0  Afraid - awful might happen 1 0 0 0  Total GAD 7 Score '5 3 8 6  '$ Anxiety Difficulty Not difficult at all - Very difficult -        Assessment/ Plan: [name] here for annual physical exam.   Annual physical exam  Postoperative hypothyroidism - Plan: TSH, T4, Free  Depressive disorder  Stress at home  Elevated blood pressure reading without diagnosis of hypertension  Shingles and pneumococcal vaccinations administered.  Asymptomatic from a thyroid standpoint.  Check TSH, free T4  Depressive disorder is stable with Wellbutrin.  Stress at home is improving  Blood pressure noted to be elevated.  Plan for recheck in 2 weeks  Counseled on healthy lifestyle choices, including diet (rich in fruits, vegetables and  lean meats and low in salt and simple carbohydrates) and exercise (at least 30 minutes of moderate physical activity daily).  Patient to follow up in 1 year for annual exam or sooner if needed.  Sorin Frimpong M. Lajuana Ripple, DO

## 2020-10-25 DIAGNOSIS — D485 Neoplasm of uncertain behavior of skin: Secondary | ICD-10-CM | POA: Diagnosis not present

## 2020-10-25 DIAGNOSIS — D0472 Carcinoma in situ of skin of left lower limb, including hip: Secondary | ICD-10-CM | POA: Diagnosis not present

## 2020-10-25 DIAGNOSIS — L570 Actinic keratosis: Secondary | ICD-10-CM | POA: Diagnosis not present

## 2020-10-25 DIAGNOSIS — D239 Other benign neoplasm of skin, unspecified: Secondary | ICD-10-CM | POA: Diagnosis not present

## 2020-10-25 DIAGNOSIS — L814 Other melanin hyperpigmentation: Secondary | ICD-10-CM | POA: Diagnosis not present

## 2020-10-25 DIAGNOSIS — D1801 Hemangioma of skin and subcutaneous tissue: Secondary | ICD-10-CM | POA: Diagnosis not present

## 2020-10-25 LAB — T4, FREE: Free T4: 1.44 ng/dL (ref 0.82–1.77)

## 2020-10-25 LAB — TSH: TSH: 2.81 u[IU]/mL (ref 0.450–4.500)

## 2020-11-12 ENCOUNTER — Encounter: Payer: Self-pay | Admitting: Family Medicine

## 2020-11-12 DIAGNOSIS — Z743 Need for continuous supervision: Secondary | ICD-10-CM | POA: Diagnosis not present

## 2020-11-12 DIAGNOSIS — R109 Unspecified abdominal pain: Secondary | ICD-10-CM | POA: Diagnosis not present

## 2020-11-14 ENCOUNTER — Telehealth: Payer: Self-pay | Admitting: Family Medicine

## 2020-11-14 NOTE — Telephone Encounter (Signed)
Spoke with patient, appointment scheduled for 11/15/20 at 8:30 am with Dr. Lajuana Ripple.  Patient had a syncopal episode over the weekend while at a wedding and went to ER in [place], [place].  Has had issues after eating with what she thinks may be hiatal hernia.

## 2020-11-15 ENCOUNTER — Encounter: Payer: Self-pay | Admitting: Family Medicine

## 2020-11-15 ENCOUNTER — Ambulatory Visit: Payer: Medicare PPO | Admitting: Family Medicine

## 2020-11-15 ENCOUNTER — Other Ambulatory Visit: Payer: Self-pay

## 2020-11-15 VITALS — BP 133/92 | HR 65 | Temp 97.8°F | Ht 64.5 in | Wt 188.0 lb

## 2020-11-15 DIAGNOSIS — R55 Syncope and collapse: Secondary | ICD-10-CM | POA: Diagnosis not present

## 2020-11-15 DIAGNOSIS — R1310 Dysphagia, unspecified: Secondary | ICD-10-CM

## 2020-11-15 NOTE — Patient Instructions (Signed)
You had labs performed today.  You will be contacted with the results of the labs once they are available, usually in the next 3 business days for routine lab work.  If you have an active my chart account, they will be released to your MyChart.  If you prefer to have these labs released to you via telephone, please let us know.  If you had a pap smear or biopsy performed, expect to be contacted in about 7-10 days.  I think this was vasovagal syncope.  I am going to see if we can get you into Dr Laural Golden and your cardiologist's office sooner for total evaluation.  If symptoms recur, please GO TO ER  Syncope  Syncope refers to a condition in which a person temporarily loses consciousness. Syncope may also be called fainting or passing out. It is caused by a sudden decrease in blood flow to the brain. Even though most causes of syncope are not dangerous, syncope can be a sign of a serious medical problem. Your health care provider may do tests to find the reason why you are havingsyncope. Signs that you may be about to faint include: Feeling dizzy or light-headed. Feeling nauseous. Seeing all white or all black in your field of vision. Having cold, clammy skin. If you faint, get medical help right away. Call your local emergency services (911 in the [place] Do not drive yourself to the hospital. Follow these instructions at home: Pay attention to any changes in your symptoms. Take these actions to stay safeand to help relieve your symptoms: Lifestyle Do not drive, use machinery, or play sports until your health care provider says it is okay. Do not drink alcohol. Do not use any products that contain nicotine or tobacco, such as cigarettes and e-cigarettes. If you need help quitting, ask your health care provider. Drink enough fluid to keep your urine pale yellow. General instructions Take over-the-counter and prescription medicines only as told by your health care provider. If you are taking  blood pressure or heart medicine, get up slowly and take several minutes to sit and then stand. This can reduce dizziness or light-headedness. Have someone stay with you until you feel stable. If you start to feel like you might faint, lie down right away and raise (elevate) your feet above the level of your heart. Breathe deeply and steadily. Wait until all the symptoms have passed. Keep all follow-up visits as told by your health care provider. This is important. Get help right away if you: Have a severe headache. Faint once or repeatedly. Have pain in your chest, abdomen, or back. Have a very fast or irregular heartbeat (palpitations). Have pain when you breathe. Are bleeding from your mouth or rectum, or you have black or tarry stool. Have a seizure. Are confused. Have trouble walking. Have severe weakness. Have vision problems. These symptoms may represent a serious problem that is an emergency. Do not wait to see if your symptoms will go away. Get medical help right away. Call your local emergency services (911 in the [place] Do not drive yourself to the hospital. Summary Syncope refers to a condition in which a person temporarily loses consciousness. It is caused by a sudden decrease in blood flow to the brain. Signs that you may be about to faint include dizziness, feeling light-headed, feeling nauseous, sudden vision changes, or cold, clammy skin. Although most causes of syncope are not dangerous, syncope can be a sign of a serious medical problem. If you faint, get  medical help right away. This information is not intended to replace advice given to you by your health care provider. Make sure you discuss any questions you have with your healthcare provider. Document Revised: 06/23/2019 Document Reviewed: 07/23/2019 Elsevier Patient Education  [place].

## 2020-11-15 NOTE — Progress Notes (Signed)
Subjective: CC: ER follow up for syncope PCP: [name], DO FUX:NATFTD W Kolarik is a 75 y.o. female presenting to clinic today for:  1. Syncope Patient reports that she experienced a case of syncope while she was at a wedding over the weekend.  She notes that the syncopal episode was preceded by some GI discomfort.  She is had about 3 episodes of feeling like food is getting stuck in her upper abdomen.  She started becoming nauseated, sweaty and then was observed to have passed out.  She was laid back in her chair and came to with many people standing around her.  Apparently there were several doctors and EMTs around that were able to assess her.  1 of whom had noted that her pulse was quite low during the episode.  She was allowed to rest for second and then they brought her back into seated position when a second episode occurred.  She sought care in the ER in [place] but the wait was several hours so she and her husband decided to leave and try and be evaluated today in office.  She is had no recurrence of her symptoms.  She has reached out to her cardiologist but apparently cannot be seen for a few weeks.  She has not reached out to Dr. Laural Golden, her GI doctor, for further evaluation of her abdominal symptoms.  She is hydrating without difficulty.  Denies any chest pain, shortness of breath, palpitations or bleeding.   ROS: Per HPI  Allergies  Allergen Reactions   Elemental Sulfur Anaphylaxis   Past Medical History:  Diagnosis Date   Arthritis    Cancer ([place])    Thyroid   History of hiatal hernia    History of pneumonia    Hyperthyroidism    Migraine     Current Outpatient Medications:    acetaminophen (TYLENOL) 500 MG tablet, Take 500 mg by mouth every 6 (six) hours as needed for mild pain or moderate pain., Disp: , Rfl:    aspirin EC 81 MG tablet, Take 81 mg by mouth daily as needed for mild pain or moderate pain. , Disp: , Rfl:    B-COMPLEX-C PO, Take 1 tablet by mouth  daily., Disp: , Rfl:    buPROPion (WELLBUTRIN) 75 MG tablet, Take 1 tablet (75 mg total) by mouth 2 (two) times daily., Disp: 180 tablet, Rfl: 3   cholecalciferol (VITAMIN D3) 25 MCG (1000 UNIT) tablet, Take 1,000 Units by mouth daily., Disp: , Rfl:    co-enzyme Q-10 30 MG capsule, Take 30 mg by mouth 3 (three) times daily., Disp: , Rfl:    levothyroxine (SYNTHROID) 100 MCG tablet, TAKE 1 TABLET BY MOUTH DAILY, Disp: 90 tablet, Rfl: 3   Multiple Vitamin (MULTIVITAMIN WITH MINERALS) TABS tablet, Take 1 tablet by mouth daily., Disp: , Rfl:    Omega-3 Fatty Acids (OMEGA 3 PO), Take 1 capsule by mouth daily. , Disp: , Rfl:  Social History   Socioeconomic History   Marital status: Married    Spouse name: [name]   Number of children: 4   Years of education: Bachelors Degree   Highest education level: Bachelor's degree (e.g., BA, AB, BS)  Occupational History   Occupation: Retired    Fish farm manager: [employer]  Tobacco Use   Smoking status: Never   Smokeless tobacco: Never  Vaping Use   Vaping Use: Never used  Substance and Sexual Activity   Alcohol use: Yes    Comment: rare  Drug use: No   Sexual activity: Yes    Birth control/protection: Post-menopausal  Other Topics Concern   Not on file  Social History Narrative   Not on file   Social Determinants of Health   Financial Resource Strain: Not on file  Food Insecurity: Not on file  Transportation Needs: Not on file  Physical Activity: Not on file  Stress: Not on file  Social Connections: Not on file  Intimate Partner Violence: Not on file   Family History  Problem Relation Age of Onset   Colon cancer Brother    Cancer Brother    Lung disease Sister    Hypothyroidism Sister    Cancer Brother     Objective: Office vital signs reviewed. BP (!) 133/92   Pulse 65   Temp 97.8 F (36.6 C)   Ht 5' 4.5" (1.638 m)   Wt 188 lb (85.3 kg)   SpO2 96%   BMI 31.77 kg/m   Physical Examination:  General: Awake,  alert, well nourished, No acute distress HEENT: Normal; sclera white.  No goiter.  No carotid bruits Cardio: regular rate and rhythm, S1S2 heard, no murmurs appreciated Pulm: clear to auscultation bilaterally, no wheezes, rhonchi or rales; normal work of breathing on room air Extremities: warm, well perfused, No edema, cyanosis or clubbing; +2 pulses bilaterally Neuro: Cranial nerves II through XII gross intact.  No focal neurologic deficits.  Alert and oriented     Assessment/ Plan: 75 y.o. female   Syncope, unspecified syncope type - Plan: TSH, T4, Free, CBC, CMP14+EGFR, Magnesium  Dysphagia, unspecified type  From description it sounds like she had a vasovagal episode.  However, I agree that her initial EKG with the ambulance seemed somewhat abnormal.  I would like her to see her cardiologist and I will CC her chart with her cardiologist and her gastroenterologist.  In the meantime, will evaluate for possible metabolic etiology.  Discussed that if this recurs she is to seek immediate medical attention the emergency department.  No orders of the defined types were placed in this encounter.  No orders of the defined types were placed in this encounter.    [name], DO [place] [phone]

## 2020-11-16 ENCOUNTER — Other Ambulatory Visit: Payer: Self-pay | Admitting: Family Medicine

## 2020-11-16 DIAGNOSIS — E890 Postprocedural hypothyroidism: Secondary | ICD-10-CM

## 2020-11-16 LAB — CMP14+EGFR
ALT: 16 IU/L (ref 0–32)
AST: 19 IU/L (ref 0–40)
Albumin/Globulin Ratio: 1.8 (ref 1.2–2.2)
Albumin: 4.2 g/dL (ref 3.7–4.7)
Alkaline Phosphatase: 98 IU/L (ref 44–121)
BUN/Creatinine Ratio: 21 (ref 12–28)
BUN: 15 mg/dL (ref 8–27)
Bilirubin Total: 0.3 mg/dL (ref 0.0–1.2)
CO2: 21 mmol/L (ref 20–29)
Calcium: 9.5 mg/dL (ref 8.7–10.3)
Chloride: 100 mmol/L (ref 96–106)
Creatinine, Ser: 0.7 mg/dL (ref 0.57–1.00)
Globulin, Total: 2.3 g/dL (ref 1.5–4.5)
Glucose: 86 mg/dL (ref 65–99)
Potassium: 4.7 mmol/L (ref 3.5–5.2)
Sodium: 135 mmol/L (ref 134–144)
Total Protein: 6.5 g/dL (ref 6.0–8.5)
eGFR: 91 mL/min/{1.73_m2} (ref >59)

## 2020-11-16 LAB — CBC
Hematocrit: 41.3 % (ref 34.0–46.6)
Hemoglobin: 13.8 g/dL (ref 11.1–15.9)
MCH: 28.6 pg (ref 26.6–33.0)
MCHC: 33.4 g/dL (ref 31.5–35.7)
MCV: 86 fL (ref 79–97)
Platelets: 319 10*3/uL (ref 150–450)
RBC: 4.83 x10E6/uL (ref 3.77–5.28)
RDW: 13.2 % (ref 11.7–15.4)
WBC: 7.2 10*3/uL (ref 3.4–10.8)

## 2020-11-16 LAB — T4, FREE: Free T4: 1.31 ng/dL (ref 0.82–1.77)

## 2020-11-16 LAB — TSH: TSH: 9.01 u[IU]/mL — ABNORMAL HIGH (ref 0.450–4.500)

## 2020-11-16 LAB — MAGNESIUM: Magnesium: 2.1 mg/dL (ref 1.6–2.3)

## 2020-11-17 NOTE — Progress Notes (Signed)
Cardiology Office Note  Date: 11/18/2020   ID: Carrole, Mcmoore 1946/01/10, MRN [id number]  PCP:  [name], DO  Cardiologist:  Rozann Lesches, MD Electrophysiologist:  None   Chief Complaint: Syncope episode on saturday 11/12/20  History of Present Illness: Amber Rivas is a 75 y.o. female with a history of history of chest pain, elevated blood pressure reading, hypothyroidism  She was last seen by Dr. Domenic Polite 04/07/2020 for cardiology consultation by Dr. Laverta Baltimore for evaluation of chest pain.  She had been seen in the emergency room at any panel December 21.  EKG with nonspecific ST changes.  CXR possible left basilar atelectasis versus developing infiltrate.  Troponins were normal.  She had been working on a recliner with her husband and began to experience pressure-like sensation in the left chest with radiation to the shoulder lasted approximately 4 hours and resolved.  She was only given aspirin.  She had no recurrent symptoms since that time.  She was also scheduled to undergo a left gastric resection and calcaneal osteotomy under general anesthesia with Dr. Doran Durand in January. Plan was to proceed with [facility].  Blood pressure was elevated at visit.  Had no history of hypertension.  She was asked to check blood pressure periodically and continue to follow-up with her PCP in case antihypertension therapy needed to be considered.  Also advised weight loss and salt restriction.  She was continuing Synthroid for hypothyroidism.   Telephone encounter on 11/14/2020 with nursing at PCP office.  Patient had a syncopal episode over the weekend while at a wedding and went to ER in [place].  Had issues after eating  which she thought to be hiatal hernia.  She saw Dr. Lajuana Ripple the following day on November 15, 2020.  She stated that syncopal episode was preceded by some GI discomfort.  She had 3 episodes where she felt like food was getting stuck in her upper abdomen.  She  started becoming nauseated, sweaty, and was observed to have passed out.  There were several medical professionals present after syncopal episode who noted her heart rate to be low.  She had no recurrence of her symptoms.  She had reached out to cardiology.  Could not be seen for a few weeks.  Her PCP ordered thyroid profile, CBC, metabolic panel and magnesium.  Magnesium was normal chemistry and renal function was normal with liver enzymes normal.  TSH was 9.0, free T4 1.31.  She is here for above mentioned complaint of syncope after becoming nauseated, sweaty after eating a piece of food.  At that visit she was participating in there were several doctors who examined her.  She was seen at [facility] hospital and EKG was performed which she has with her today showing sinus rhythm with a rate of 86.  This was done on 11/12/2020.  The EKG performed by EMS showed sinus rhythm with widespread ST abnormality with heart rate of 91.  She saw her primary care provider yesterday who performed several labs on her.  Her chemistry was normal.  CBC was normal.  TSH was 9.0.   T4 1.31.  She previously underwent a thyroidectomy.  She is on thyroid replacement therapy.  Blood pressure is slightly elevated today at 138/88.  She brings with her a log of blood pressures which seem to range in the low 120s to low [id number] systolic.  She has had no further recurrences of syncope.  He states he was eating a piece of meat  which was very chewy.  She became nauseated and felt as though the food got stuck in her esophagus.  She began having pain, nausea and sweating then syncopal episode.  She states she has had at least 4 episodes of feeling as though food would get stuck in her lower esophagus after eating leading up to this event.  She states she is under a lot of stress recently.  States she has had close family members with issues with her stomach and pancreas.    Past Medical History:  Diagnosis Date   Arthritis    Cancer [facility])     Thyroid   History of hiatal hernia    History of pneumonia    Hyperthyroidism    Migraine     Past Surgical History:  Procedure Laterality Date   ANKLE SURGERY Right    CHOLECYSTECTOMY  2005   COLONOSCOPY N/A 04/28/2015   Procedure: COLONOSCOPY;  Surgeon: Rogene Houston, MD;  [facility];  Service: Endoscopy;  Laterality: N/A;  730   THYROIDECTOMY Left 02/07/2017   Procedure: TOTAL THYROIDECTOMY;  Surgeon: Michael Boston, MD;  Location: [facility];  Service: General;  Laterality: Left;  GENERAL AND LOCAL     Current Outpatient Medications  Medication Sig Dispense Refill   acetaminophen (TYLENOL) 500 MG tablet Take 500 mg by mouth every 6 (six) hours as needed for mild pain or moderate pain.     aspirin EC 81 MG tablet Take 81 mg by mouth daily as needed for mild pain or moderate pain.      B-COMPLEX-C PO Take 1 tablet by mouth daily.     buPROPion (WELLBUTRIN) 75 MG tablet Take 1 tablet (75 mg total) by mouth 2 (two) times daily. 180 tablet 3   cholecalciferol (VITAMIN D3) 25 MCG (1000 UNIT) tablet Take 1,000 Units by mouth daily.     co-enzyme Q-10 30 MG capsule Take 30 mg by mouth 3 (three) times daily.     levothyroxine (SYNTHROID) 100 MCG tablet TAKE 1 TABLET BY MOUTH DAILY 90 tablet 3   Multiple Vitamin (MULTIVITAMIN WITH MINERALS) TABS tablet Take 1 tablet by mouth daily.     Omega-3 Fatty Acids (OMEGA 3 PO) Take 1 capsule by mouth daily.      No current facility-administered medications for this visit.   Allergies:  Elemental sulfur   Social History: The patient  reports that she has never smoked. She has never used smokeless tobacco. She reports current alcohol use. She reports that she does not use drugs.   Family History: The patient's family history includes Cancer in her brother and brother; Colon cancer in her brother; Hypothyroidism in her sister; Lung disease in her sister.   ROS:  Please see the history of present illness. Otherwise, complete review of  systems is positive for none.  All other systems are reviewed and negative.   Physical Exam: VS:  BP 138/88   Pulse 86   Ht 5' 4.5" (1.638 m)   Wt 186 lb 8 oz (84.6 kg)   SpO2 99%   BMI 31.52 kg/m , BMI Body mass index is 31.52 kg/m.  Wt Readings from Last 3 Encounters:  11/18/20 186 lb 8 oz (84.6 kg)  11/15/20 188 lb (85.3 kg)  10/24/20 187 lb 9.6 oz (85.1 kg)    General: Patient appears comfortable at rest. Neck: Supple, no elevated JVP or carotid bruits, no thyromegaly. Lungs: Clear to auscultation, nonlabored breathing at rest. Cardiac: Regular rate and rhythm, no  S3 or significant systolic murmur, no pericardial rub. Extremities: No pitting edema, distal pulses 2+. Skin: Warm and dry. Musculoskeletal: No kyphosis. Neuropsychiatric: Alert and oriented x3, affect grossly appropriate.  ECG: EKG from [facility] on November 12, 2020 at 2230 showed sinus rhythm with a rate of 86.  Recent Labwork: 11/15/2020: ALT 16; AST 19; BUN 15; Creatinine, Ser 0.70; Hemoglobin 13.8; Magnesium 2.1; Platelets 319; Potassium 4.7; Sodium 135; TSH 9.010     Component Value Date/Time   CHOL 209 (H) 11/12/2016 1018   TRIG 166 (H) 11/12/2016 1018   HDL 66 11/12/2016 1018   CHOLHDL 3.2 11/12/2016 1018   LDLCALC 110 (H) 11/12/2016 1018    Other Studies Reviewed Today:  Lexiscan stress test 04/08/2020  Study Result  Narrative & Impression  There was no ST segment deviation noted during stress. The study is normal. There are no perfusion defects consistent with prior infarct or current ischemia. This is a low risk study. The left ventricular ejection fraction is hyperdynamic (>65%).    Assessment and Plan:  1. Syncope, unspecified syncope type   2. History of chest pain   3. Elevated blood pressure reading   4. Hypothyroidism, unspecified type    1. Syncope, unspecified syncope type Recent syncopal episode.  Orthostatics today were normal.  Patient denies any sense of palpitations or  arrhythmias.  He states she feels occasional skipped beats.  Patient states she was in a recent social event and was eating a piece of meat which she felt got stuck in her lower esophagus.  She began having pain, nausea, sweatiness, dizziness and subsequent syncopal episode.  Several medical professionals attending the event that she had a very slow heart rate after passing out.  Please get a 7-day ZIO monitor to assess for possible arrhythmias  2. History of chest pain Previous history of chest pain with low risk stress test.  Recent epigastric pain after eating a piece of meat at a recent social event.  3. Elevated blood pressure reading Blood pressure today 138/88.  However she brought a log of blood pressures from home today which seem to be running in the low [id number] to [id number] systolic.  4. Hypothyroidism, unspecified type History of thyroidectomy with thyroid replacement therapy.  Recent lab work at PCP office showed elevated TSH of 9.0 but T4 was 1.31.  Currently on levothyroxine 100 mcg daily.  5.  Epigastric pain/dysphagia Patient states she recently was eating a piece of meat at a social event and felt as though it got stuck in her lower esophagus.  She is subsequently started having pain, nausea, sweatiness/clamminess with subsequent syncopal episode.  She states she has had previous episodes of feeling as though the food gets stuck in her lower esophagus.  Please refer to [facility] for epigastric pain and dysphagia.  Medication Adjustments/Labs and Tests Ordered: Current medicines are reviewed at length with the patient today.  Concerns regarding medicines are outlined above.   Disposition: Follow-up with Dr. Domenic Polite or APP 6 to  Signed, Levell July, NP 11/18/2020 2:10 PM    [facility] [place], [place], [place] [address] Phone: [phone]; Fax: [phone]

## 2020-11-18 ENCOUNTER — Ambulatory Visit (INDEPENDENT_AMBULATORY_CARE_PROVIDER_SITE_OTHER): Payer: Medicare PPO

## 2020-11-18 ENCOUNTER — Encounter: Payer: Self-pay | Admitting: Family Medicine

## 2020-11-18 ENCOUNTER — Ambulatory Visit: Payer: Medicare PPO | Admitting: Family Medicine

## 2020-11-18 ENCOUNTER — Other Ambulatory Visit: Payer: Self-pay | Admitting: Cardiology

## 2020-11-18 ENCOUNTER — Telehealth: Payer: Self-pay | Admitting: Family Medicine

## 2020-11-18 ENCOUNTER — Other Ambulatory Visit: Payer: Self-pay

## 2020-11-18 VITALS — BP 138/88 | HR 86 | Ht 64.5 in | Wt 186.5 lb

## 2020-11-18 DIAGNOSIS — Z87898 Personal history of other specified conditions: Secondary | ICD-10-CM

## 2020-11-18 DIAGNOSIS — E039 Hypothyroidism, unspecified: Secondary | ICD-10-CM

## 2020-11-18 DIAGNOSIS — R55 Syncope and collapse: Secondary | ICD-10-CM

## 2020-11-18 DIAGNOSIS — R1013 Epigastric pain: Secondary | ICD-10-CM | POA: Diagnosis not present

## 2020-11-18 DIAGNOSIS — R030 Elevated blood-pressure reading, without diagnosis of hypertension: Secondary | ICD-10-CM

## 2020-11-18 NOTE — Telephone Encounter (Signed)
PERCERT :  7 day zio - syncope

## 2020-11-18 NOTE — Patient Instructions (Addendum)
Medication Instructions:  Continue all current medications.   Labwork: none  Testing/Procedures: Your physician has recommended that you wear a 7 day event monitor. Event monitors are medical devices that record the heart's electrical activity. Doctors most often [place] these monitors to diagnose arrhythmias. Arrhythmias are problems with the speed or rhythm of the heartbeat. The monitor is a small, portable device. You can wear one while you do your normal daily activities. This is usually used to diagnose what is causing palpitations/syncope (passing out). Office will contact with results via phone or letter.   Follow-Up: 6 months   Any Other Special Instructions Will Be Listed Below (If Applicable).  You have been referred to:  GI    If you need a refill on your cardiac medications before your next appointment, please call your pharmacy.

## 2020-11-22 ENCOUNTER — Encounter (INDEPENDENT_AMBULATORY_CARE_PROVIDER_SITE_OTHER): Payer: Self-pay | Admitting: *Deleted

## 2020-11-23 DIAGNOSIS — R55 Syncope and collapse: Secondary | ICD-10-CM

## 2020-11-24 ENCOUNTER — Encounter (INDEPENDENT_AMBULATORY_CARE_PROVIDER_SITE_OTHER): Payer: Self-pay | Admitting: Gastroenterology

## 2020-11-24 ENCOUNTER — Other Ambulatory Visit: Payer: Self-pay

## 2020-11-24 ENCOUNTER — Ambulatory Visit (INDEPENDENT_AMBULATORY_CARE_PROVIDER_SITE_OTHER): Payer: Medicare PPO | Admitting: Gastroenterology

## 2020-11-24 DIAGNOSIS — R1319 Other dysphagia: Secondary | ICD-10-CM

## 2020-11-24 DIAGNOSIS — R1013 Epigastric pain: Secondary | ICD-10-CM | POA: Diagnosis not present

## 2020-11-24 DIAGNOSIS — R1310 Dysphagia, unspecified: Secondary | ICD-10-CM | POA: Insufficient documentation

## 2020-11-24 NOTE — Progress Notes (Signed)
Amber Rivas, M.D. Gastroenterology & Hepatology [facility] [address] [place], [place] [address] Primary Care Physician: [name], DO [address] [place] [address]  Referring MD: Lavetta Nielsen. Leonides Sake, NP  Chief Complaint: Dysphagia  History of Present Illness: Amber Rivas is a 75 y.o. female medical history of hypothyroidism, hypertension, thyroid cancer, who presents for evaluation of dysphagia and epigastric pain.  Patient reports that the last few years she has had episodes of feeling her "food got stuck in the lower chest", close to 4 times in the last year (at least two times it has happened with meat). However, she was in a wedding at [place] recently (on 11/12/20) and ate some prime rib and felt the food got stuck in her lower mid sternal area. Due to this, she started feeling malaise and with discomfort, after which she presented diaphoresis and had subsequent syncopal episode. She did not vomit the food. No odynophagia. As part of the evaluation of this episode, she was evaluated by cardiology on 11/18/2020 and it was considered the event was likely vasovagal as it was triggered with the food intake episode.  However, she is currently undergoing cardiac monitoring with a 7-day ZIO monitor.  The patient reports that she had a knee surgery in her L knee on January 2022 and was taking some NSAIDs as part of her post surgical management. This led to epigastric burning sensation. She currently takes Prilosec for the epigastric pain, which she feels has helped somehow to improve the pain but not completely resolved.  The patient reports that after getting her COVID shots, she had nausea, this has slightly improved since then.  The patient denies having any nausea, vomiting, fever, chills, hematochezia, melena, hematemesis, abdominal distention, abdominal pain, jaundice, pruritus. Has lost a few pounds on purpose.  She has also presented change in her bowel movement frequency (alternating diarrhea and constipation)  for the last month - states has been under significant stress due to numerous health issues and familial issues.  Last EGD:> 10 years ago, normal per patient but no report is available Last Colonoscopy:2016  Prep excellent. Normal mucosa of terminal ileum. Normal mucosa of cecum, ascending colon, hepatic flexure, transverse colon, splenic flexure and descending colon. Few small diverticula at sigmoid colon. Normal rectal mucosa and anorectal junction.  FHx: neg for any gastrointestinal/liver disease, colorectal cancer half brother at age 14, brother prostate cancer, liver cancer due to EtOH brother Social: neg smoking, alcohol or illicit drug use Surgical: cholecystectomy, tonsillectomy, ankle reconstruction  Past Medical History: Past Medical History:  Diagnosis Date   Arthritis    Cancer ([place])    Thyroid   History of hiatal hernia    History of pneumonia    Hyperthyroidism    Migraine     Past Surgical History: Past Surgical History:  Procedure Laterality Date   ANKLE SURGERY Right    CHOLECYSTECTOMY  2005   COLONOSCOPY N/A 04/28/2015   Procedure: COLONOSCOPY;  Surgeon: Rogene Houston, MD;  [facility];  Service: Endoscopy;  Laterality: N/A;  730   THYROIDECTOMY Left 02/07/2017   Procedure: TOTAL THYROIDECTOMY;  Surgeon: Michael Boston, MD;  Location[facility];  Service: General;  Laterality: Left;  GENERAL AND LOCAL     Family History: Family History  Problem Relation Age of Onset   Colon cancer Brother    Cancer Brother    Lung disease Sister    Hypothyroidism Sister  Cancer Brother     Social History: Social History   Tobacco Use  Smoking Status Never  Smokeless Tobacco Never   Social History   Substance and Sexual Activity  Alcohol Use Yes   Comment: rare   Social History   Substance and Sexual Activity  Drug Use No     Allergies: Allergies  Allergen Reactions   Elemental Sulfur Anaphylaxis    Medications: Current Outpatient Medications  Medication Sig Dispense Refill   acetaminophen (TYLENOL) 500 MG tablet Take 500 mg by mouth every 6 (six) hours as needed for mild pain or moderate pain.     aspirin EC 81 MG tablet Take 81 mg by mouth daily as needed for mild pain or moderate pain.      B-COMPLEX-C PO Take 1 tablet by mouth daily.     buPROPion (WELLBUTRIN) 75 MG tablet Take 1 tablet (75 mg total) by mouth 2 (two) times daily. 180 tablet 3   cholecalciferol (VITAMIN D3) 25 MCG (1000 UNIT) tablet Take 1,000 Units by mouth daily.     co-enzyme Q-10 30 MG capsule Take 30 mg by mouth 3 (three) times daily.     levothyroxine (SYNTHROID) 100 MCG tablet TAKE 1 TABLET BY MOUTH DAILY 90 tablet 3   Multiple Vitamin (MULTIVITAMIN WITH MINERALS) TABS tablet Take 1 tablet by mouth daily.     Omega-3 Fatty Acids (OMEGA 3 PO) Take 1 capsule by mouth daily.      No current facility-administered medications for this visit.    Review of Systems: GENERAL: negative for malaise, night sweats HEENT: No changes in hearing or vision, no nose bleeds or other nasal problems. NECK: Negative for lumps, goiter, pain and significant neck swelling RESPIRATORY: Negative for cough, wheezing CARDIOVASCULAR: Negative for chest pain, leg swelling, palpitations, orthopnea GI: SEE HPI MUSCULOSKELETAL: Negative for joint pain or swelling, back pain, and muscle pain. SKIN: Negative for lesions, rash PSYCH: Negative for sleep disturbance, mood disorder and recent psychosocial stressors. HEMATOLOGY Negative for prolonged bleeding, bruising easily, and swollen nodes. ENDOCRINE: Negative for cold or heat intolerance, polyuria, polydipsia and goiter. NEURO: negative for tremor, gait imbalance, syncope and seizures. The remainder of the review of systems is noncontributory.   Physical Exam: BP 134/86 (BP Location: Left Arm, Patient  Position: Sitting, Cuff Size: Large)   Pulse 82   Temp 98 F (36.7 C) (Oral)   Ht 5' 4.5" (1.638 m)   Wt 186 lb 12.8 oz (84.7 kg)   BMI 31.57 kg/m  GENERAL: The patient is AO x3, in no acute distress. HEENT: Head is normocephalic and atraumatic. EOMI are intact. Mouth is well hydrated and without lesions. NECK: Supple. No masses LUNGS: Clear to auscultation. No presence of rhonchi/wheezing/rales. Adequate chest expansion HEART: RRR, normal s1 and s2. ABDOMEN: Soft, nontender, no guarding, no peritoneal signs, and nondistended. BS +. No masses. EXTREMITIES: Without any cyanosis, clubbing, rash, lesions or edema. NEUROLOGIC: AOx3, no focal motor deficit. SKIN: no jaundice, no rashes   Imaging/Labs: as above  I personally reviewed and interpreted the available labs, imaging and endoscopic files.  Impression and Plan: KATLIN GODBOUT is a 75 y.o. female medical history of hypothyroidism, hypertension, thyroid cancer, who presents for evaluation of dysphagia and epigastric pain.  Patient has presented with some episodes of dysphagia chronically and recently presented with a syncopal episode due to her dysphagia.  We will need to explore this further with an EGD with possible esophageal dilation.  She has also presented some epigastric  pain of unclear etiology which will be further explored with this endoscopy but if negative investigations, we will need to perform cross-sectional abdominal imaging with a CT scan of the abdomen and pelvis with IV contrast.  As he has been significant improvement with the use of Prilosec, I advised her to continue taking the medication on a daily basis.  - Schedule EGD with ED - Continue Prilosec daily - Instruction provided in the use of antireflux medication - patient should take medication in the morning 30-45 minutes before eating breakfast. Discussed avoidance of eating within 2 hours of lying down to sleep and benefit of blocks to elevate head of bed.    All questions were answered.      Amber Peppers, MD Gastroenterology and Hepatology [facility]

## 2020-11-24 NOTE — Patient Instructions (Signed)
Schedule EGD with ED Continue Prilosec daily Instruction provided in the use of antireflux medication - patient should take medication in the morning 30-45 minutes before eating breakfast. Discussed avoidance of eating within 2 hours of lying down to sleep and benefit of blocks to elevate head of bed.

## 2020-11-30 ENCOUNTER — Ambulatory Visit: Payer: Medicare PPO | Admitting: Family Medicine

## 2020-12-06 ENCOUNTER — Encounter (INDEPENDENT_AMBULATORY_CARE_PROVIDER_SITE_OTHER): Payer: Self-pay

## 2020-12-06 ENCOUNTER — Other Ambulatory Visit (INDEPENDENT_AMBULATORY_CARE_PROVIDER_SITE_OTHER): Payer: Self-pay

## 2020-12-06 DIAGNOSIS — R55 Syncope and collapse: Secondary | ICD-10-CM | POA: Diagnosis not present

## 2020-12-08 DIAGNOSIS — D0472 Carcinoma in situ of skin of left lower limb, including hip: Secondary | ICD-10-CM | POA: Diagnosis not present

## 2020-12-21 ENCOUNTER — Other Ambulatory Visit: Payer: Medicare PPO

## 2020-12-21 ENCOUNTER — Other Ambulatory Visit: Payer: Self-pay

## 2020-12-22 LAB — THYROID PANEL WITH TSH
Free Thyroxine Index: 2.2 (ref 1.2–4.9)
T3 Uptake Ratio: 28 % (ref 24–39)
T4, Total: 8 ug/dL (ref 4.5–12.0)
TSH: 2.43 u[IU]/mL (ref 0.450–4.500)

## 2021-01-04 ENCOUNTER — Encounter: Payer: Self-pay | Admitting: *Deleted

## 2021-01-12 DIAGNOSIS — Z23 Encounter for immunization: Secondary | ICD-10-CM | POA: Diagnosis not present

## 2021-01-18 ENCOUNTER — Ambulatory Visit (HOSPITAL_COMMUNITY): Admission: RE | Admit: 2021-01-18 | Payer: Medicare PPO | Source: Home / Self Care | Admitting: Gastroenterology

## 2021-01-18 ENCOUNTER — Encounter (HOSPITAL_COMMUNITY): Admission: RE | Payer: Self-pay | Source: Home / Self Care

## 2021-01-18 SURGERY — ESOPHAGOGASTRODUODENOSCOPY (EGD) WITH PROPOFOL
Anesthesia: Monitor Anesthesia Care

## 2021-03-03 DIAGNOSIS — H01115 Allergic dermatitis of left lower eyelid: Secondary | ICD-10-CM | POA: Diagnosis not present

## 2021-03-21 ENCOUNTER — Ambulatory Visit (INDEPENDENT_AMBULATORY_CARE_PROVIDER_SITE_OTHER): Payer: Medicare PPO | Admitting: Family Medicine

## 2021-03-21 ENCOUNTER — Telehealth: Payer: Self-pay | Admitting: Family Medicine

## 2021-03-21 ENCOUNTER — Encounter: Payer: Self-pay | Admitting: Family Medicine

## 2021-03-21 DIAGNOSIS — N3090 Cystitis, unspecified without hematuria: Secondary | ICD-10-CM

## 2021-03-21 LAB — URINALYSIS, COMPLETE
Bilirubin, UA: NEGATIVE
Nitrite, UA: POSITIVE — AB
RBC, UA: NEGATIVE
Specific Gravity, UA: 1.015 (ref 1.005–1.030)
Urobilinogen, Ur: 1 mg/dL (ref 0.2–1.0)
pH, UA: 6 (ref 5.0–7.5)

## 2021-03-21 LAB — MICROSCOPIC EXAMINATION: Renal Epithel, UA: NONE SEEN /hpf

## 2021-03-21 MED ORDER — AMOXICILLIN 500 MG PO CAPS: 500.0000 mg | ORAL_CAPSULE | Freq: Three times a day (TID) | ORAL | 0 refills | Status: DC

## 2021-03-21 NOTE — Progress Notes (Signed)
6   Subjective:    Patient ID: Amber Rivas, female    DOB: January 20, 1946, 75 y.o.   MRN: [id number]   HPI: Amber Rivas is a 75 y.o. female presenting for burning with urination and frequency for several days. Denies fever . No flank pain. No nausea, vomiting.     Depression screen [facility] 2/9 11/15/2020 10/24/2020 05/05/2020 02/13/2019 02/09/2019  Decreased Interest 0 1 0 0 1  Down, Depressed, Hopeless 0 1 1 1 1   PHQ - 2 Score 0 2 1 1 2   Altered sleeping 0 0 - 0 1  Tired, decreased energy 1 1 - 0 1  Change in appetite 0 1 - 0 0  Feeling bad or failure about yourself  1 1 - 0 0  Trouble concentrating 1 0 - 0 0  Moving slowly or fidgety/restless 0 0 - 0 0  Suicidal thoughts 0 0 - 0 0  PHQ-9 Score 3 5 - 1 4  Difficult doing work/chores - - - Not difficult at all Somewhat difficult  Some recent data might be hidden     Relevant past medical, surgical, family and social history reviewed and updated as indicated.  Interim medical history since our last visit reviewed. Allergies and medications reviewed and updated.  ROS:  Review of Systems  Constitutional:  Negative for chills, diaphoresis and fever.  HENT:  Negative for congestion.   Eyes:  Negative for visual disturbance.  Respiratory:  Negative for cough and shortness of breath.   Cardiovascular:  Negative for chest pain and palpitations.  Gastrointestinal:  Negative for constipation, diarrhea and nausea.  Genitourinary:  Positive for dysuria, frequency and urgency. Negative for decreased urine volume, flank pain, hematuria, menstrual problem and pelvic pain.  Musculoskeletal:  Negative for arthralgias and joint swelling.  Skin:  Negative for rash.  Neurological:  Negative for dizziness and numbness.    Social History   Tobacco Use  Smoking Status Never  Smokeless Tobacco Never       Objective:     Wt Readings from Last 3 Encounters:  11/24/20 186 lb 12.8 oz (84.7 kg)  11/18/20 186 lb 8 oz (84.6 kg)  11/15/20 188 lb  (85.3 kg)     Exam deferred. Pt. Harboring due to COVID 19. Phone visit performed.   Assessment & Plan:   1. Cystitis     Meds ordered this encounter  Medications   amoxicillin (AMOXIL) 500 MG capsule    Sig: Take 1 capsule (500 mg total) by mouth 3 (three) times daily.    Dispense:  21 capsule    Refill:  0    Orders Placed This Encounter  Procedures   Urine Culture    Standing Status:   Future    Number of Occurrences:   1    Standing Expiration Date:   03/21/2022   Microscopic Examination   Urinalysis, Complete    Standing Status:   Future    Number of Occurrences:   1    Standing Expiration Date:   03/21/2022      Diagnoses and all orders for this visit:  Cystitis -     Urinalysis, Complete; Future -     Urine Culture; Future -     Urine Culture -     Urinalysis, Complete -     Microscopic Examination -     amoxicillin (AMOXIL) 500 MG capsule; Take 1 capsule (500 mg total) by mouth 3 (three) times daily.    Virtual Visit  via telephone Note  I discussed the limitations, risks, security and privacy concerns of performing an evaluation and management service by telephone and the availability of in person appointments. The patient was identified with two identifiers. Pt.expressed understanding and agreed to proceed. Pt. Is at home. Dr. Livia Snellen is in his office.  Follow Up Instructions:   I discussed the assessment and treatment plan with the patient. The patient was provided an opportunity to ask questions and all were answered. The patient agreed with the plan and demonstrated an understanding of the instructions.   The patient was advised to call back or seek an in-person evaluation if the symptoms worsen or if the condition fails to improve as anticipated.   Total minutes including chart review and phone contact time: 6   Follow up plan: Return if symptoms worsen or fail to improve.  Claretta Fraise, MD [place]

## 2021-03-21 NOTE — Telephone Encounter (Signed)
I spoke with pt and she states she already has her answer and had a UTI and is on her way to pick up her meds now.

## 2021-03-23 LAB — URINE CULTURE: Organism ID, Bacteria: NO GROWTH

## 2021-04-06 ENCOUNTER — Ambulatory Visit (INDEPENDENT_AMBULATORY_CARE_PROVIDER_SITE_OTHER): Payer: Medicare PPO | Admitting: Gastroenterology

## 2021-04-15 ENCOUNTER — Other Ambulatory Visit: Payer: Self-pay | Admitting: Family Medicine

## 2021-04-15 DIAGNOSIS — E890 Postprocedural hypothyroidism: Secondary | ICD-10-CM

## 2021-04-26 ENCOUNTER — Ambulatory Visit: Payer: Medicare PPO | Admitting: Family Medicine

## 2021-04-26 ENCOUNTER — Encounter: Payer: Self-pay | Admitting: Family Medicine

## 2021-04-26 VITALS — BP 130/89 | HR 77 | Temp 97.2°F | Ht 64.5 in | Wt 188.0 lb

## 2021-04-26 DIAGNOSIS — F339 Major depressive disorder, recurrent, unspecified: Secondary | ICD-10-CM

## 2021-04-26 DIAGNOSIS — Z23 Encounter for immunization: Secondary | ICD-10-CM

## 2021-04-26 DIAGNOSIS — F439 Reaction to severe stress, unspecified: Secondary | ICD-10-CM

## 2021-04-26 DIAGNOSIS — E890 Postprocedural hypothyroidism: Secondary | ICD-10-CM

## 2021-04-26 MED ORDER — BUPROPION HCL ER (SR) 150 MG PO TB12: 150.0000 mg | ORAL_TABLET | Freq: Two times a day (BID) | ORAL | 3 refills | Status: DC

## 2021-04-26 NOTE — Patient Instructions (Signed)
[facility] Located in: [facility] Address: [address], [place], [place] [address] Hours:  Open ? Closes 5?PM Phone: [phone]

## 2021-04-26 NOTE — Progress Notes (Signed)
Subjective: [id number] PCP: [name], DO [id number] W Espejo is a 76 y.o. female presenting to clinic today for:  1. Postoperative hypothyroidism/ mood Patient is compliant with her Synthroid 100 mcg daily.  No reports of tremor, heart palpitations or change in bowel habits.  She has felt somewhat more on edge and therefore increased her Wellbutrin to 150 mg twice daily.  She does find this helpful and would like to continue this regimen.   ROS: Per HPI  Allergies  Allergen Reactions   Elemental Sulfur Anaphylaxis   Past Medical History:  Diagnosis Date   Arthritis    Cancer ([place])    Thyroid   History of hiatal hernia    History of pneumonia    Hyperthyroidism    Migraine     Current Outpatient Medications:    aspirin EC 81 MG tablet, Take 81 mg by mouth daily as needed for mild pain or moderate pain. , Disp: , Rfl:    B-COMPLEX-C PO, Take 1 tablet by mouth daily., Disp: , Rfl:    buPROPion (WELLBUTRIN) 75 MG tablet, Take 1 tablet (75 mg total) by mouth 2 (two) times daily., Disp: 180 tablet, Rfl: 3   cholecalciferol (VITAMIN D3) 25 MCG (1000 UNIT) tablet, Take 1,000 Units by mouth daily., Disp: , Rfl:    levothyroxine (SYNTHROID) 100 MCG tablet, TAKE 1 TABLET BY MOUTH EVERY DAY, Disp: 90 tablet, Rfl: 2   Multiple Vitamin (MULTIVITAMIN WITH MINERALS) TABS tablet, Take 1 tablet by mouth daily., Disp: , Rfl:    Omega-3 Fatty Acids (OMEGA 3 PO), Take 1 capsule by mouth daily. , Disp: , Rfl:  Social History   Socioeconomic History   Marital status: Married    Spouse name: [name]   Number of children: 4   Years of education: Bachelors Degree   Highest education level: Bachelor's degree (e.g., BA, AB, BS)  Occupational History   Occupation: Retired    Fish farm manager: [employer]  Tobacco Use   Smoking status: Never   Smokeless tobacco: Never  Vaping Use   Vaping Use: Never used  Substance and Sexual Activity   Alcohol use: Yes    Comment:  rare   Drug use: No   Sexual activity: Yes    Birth control/protection: Post-menopausal  Other Topics Concern   Not on file  Social History Narrative   Not on file   Social Determinants of Health   Financial Resource Strain: Not on file  Food Insecurity: Not on file  Transportation Needs: Not on file  Physical Activity: Not on file  Stress: Not on file  Social Connections: Not on file  Intimate Partner Violence: Not on file   Family History  Problem Relation Age of Onset   Colon cancer Brother    Cancer Brother    Lung disease Sister    Hypothyroidism Sister    Cancer Brother     Objective: Office vital signs reviewed. BP 130/89    Pulse 77    Temp (!) 97.2 F (36.2 C)    Ht 5' 4.5" (1.638 m)    Wt 188 lb (85.3 kg)    SpO2 97%    BMI 31.77 kg/m   Physical Examination:  General: Awake, alert, well, No acute distress HEENT: Sclera white.  Notes of thalmus.  Well-healed surgical scar noted along the base of the left side of the neck Cardio: regular rate and rhythm, S1S2 heard, no murmurs appreciated Pulm: clear to auscultation bilaterally, no wheezes, rhonchi or  rales; normal work of breathing on room air Skin: dry; intact; no rashes or lesions; normal temperature Neuro: No tremor Psych: Mood stable, speech normal, affect appropriate Depression screen [facility] 2/9 04/26/2021 11/15/2020 10/24/2020  Decreased Interest 0 0 1  Down, Depressed, Hopeless 0 0 1  PHQ - 2 Score 0 0 2  Altered sleeping - 0 0  Tired, decreased energy - 1 1  Change in appetite - 0 1  Feeling bad or failure about yourself  - 1 1  Trouble concentrating - 1 0  Moving slowly or fidgety/restless - 0 0  Suicidal thoughts - 0 0  PHQ-9 Score - 3 5  Difficult doing work/chores - - -  Some recent data might be hidden   GAD 7 : Generalized Anxiety Score 04/26/2021 11/15/2020 10/24/2020 02/13/2019  Nervous, Anxious, on Edge 0 1 1 0  Control/stop worrying 0 1 0 2  Worry too much - different things 0 0 1 2  Trouble  relaxing 0 0 0 0  Restless 0 0 0 0  Easily annoyed or irritable 0 1 1 0  Afraid - awful might happen 0 0 0 1  Total GAD 7 Score 0 3 3 5   Anxiety Difficulty Not difficult at all Somewhat difficult - Not difficult at all    Assessment/ Plan: 76 y.o. female   Postoperative hypothyroidism - Plan: TSH, T4, Free  Need for immunization against influenza - Plan: Flu Vaccine QUAD High Dose(Fluad)  Depression, recurrent (HCC) - Plan: Ambulatory referral to Psychology, buPROPion (WELLBUTRIN SR) 150 MG 12 hr tablet  Stress at home  Check thyroid levels  Influenza vaccination administered  Wellbutrin 150 mg twice daily sent to pharmacy.  I have also placed referral to therapy as I do think this patient would benefit from given multiple stressors at home with both her son and spouse.  She is very motivated to see a therapist.  I have given her information for a reading therapy and encouraged her to contact them as well  No orders of the defined types were placed in this encounter.  No orders of the defined types were placed in this encounter.    [name], DO [place] [phone]

## 2021-04-27 LAB — T4, FREE: Free T4: 1.67 ng/dL (ref 0.82–1.77)

## 2021-04-27 LAB — TSH: TSH: 0.762 u[IU]/mL (ref 0.450–4.500)

## 2021-05-08 ENCOUNTER — Ambulatory Visit (INDEPENDENT_AMBULATORY_CARE_PROVIDER_SITE_OTHER): Payer: Medicare PPO

## 2021-05-08 VITALS — Wt 188.0 lb

## 2021-05-08 DIAGNOSIS — Z0000 Encounter for general adult medical examination without abnormal findings: Secondary | ICD-10-CM | POA: Diagnosis not present

## 2021-05-08 DIAGNOSIS — Z1231 Encounter for screening mammogram for malignant neoplasm of breast: Secondary | ICD-10-CM | POA: Diagnosis not present

## 2021-05-08 NOTE — Addendum Note (Signed)
Addended by: Adalberto Cole E on: 05/08/2021 12:31 PM   Modules accepted: Orders

## 2021-05-08 NOTE — Patient Instructions (Signed)
Amber Rivas , Thank you for taking time to come for your Medicare Wellness Visit. I appreciate your ongoing commitment to your health goals. Please review the following plan we discussed and let me know if I can assist you in the future.   Screening recommendations/referrals: Colonoscopy: Done 02/02/2016 - Repeat in 10 years Mammogram: We recommend annual repeats until age 74-80 - ordered today Bone Density: Done 12/20/2017 - Repeat in 5 years *next year Recommended yearly ophthalmology/optometry visit for glaucoma screening and checkup Recommended yearly dental visit for hygiene and checkup  Vaccinations: Influenza vaccine: Done 04/26/2021 - Repeat annually or every fall Pneumococcal vaccine: Done 01/13/2019 & 10/24/2020 Tdap vaccine: Done 08/25/2011 - Repeat in 10 years *this year Shingles vaccine: Done 10/24/2020 - get second dose at next visit   Covid-19:Done 05/20/2019, 06/15/2019, 02/09/2020, 07/19/2020, 01/12/2021  Advanced directives: Please bring a copy of your health care power of attorney and living will to the office to be added to your chart at your convenience.   Conditions/risks identified: Aim for 30 minutes of exercise or brisk walking each day, drink 6-8 glasses of water and eat lots of fruits and vegetables.   Next appointment: Follow up in one year for your annual wellness visit    Preventive Care 65 Years and Older, Female Preventive care refers to lifestyle choices and visits with your health care provider that can promote health and wellness. What does preventive care include? A yearly physical exam. This is also called an annual well check. Dental exams once or twice a year. Routine eye exams. Ask your health care provider how often you should have your eyes checked. Personal lifestyle choices, including: Daily care of your teeth and gums. Regular physical activity. Eating a healthy diet. Avoiding tobacco and drug use. Limiting alcohol use. Practicing safe sex. Taking  low-dose aspirin every day. Taking vitamin and mineral supplements as recommended by your health care provider. What happens during an annual well check? The services and screenings done by your health care provider during your annual well check will depend on your age, overall health, lifestyle risk factors, and family history of disease. Counseling  Your health care provider may ask you questions about your: Alcohol use. Tobacco use. Drug use. Emotional well-being. Home and relationship well-being. Sexual activity. Eating habits. History of falls. Memory and ability to understand (cognition). Work and work Statistician. Reproductive health. Screening  You may have the following tests or measurements: Height, weight, and BMI. Blood pressure. Lipid and cholesterol levels. These may be checked every 5 years, or more frequently if you are over 81 years old. Skin check. Lung cancer screening. You may have this screening every year starting at age 43 if you have a 30-pack-year history of smoking and currently smoke or have quit within the past 15 years. Fecal occult blood test (FOBT) of the stool. You may have this test every year starting at age 14. Flexible sigmoidoscopy or colonoscopy. You may have a sigmoidoscopy every 5 years or a colonoscopy every 10 years starting at age 9. Hepatitis C blood test. Hepatitis B blood test. Sexually transmitted disease (STD) testing. Diabetes screening. This is done by checking your blood sugar (glucose) after you have not eaten for a while (fasting). You may have this done every 1-3 years. Bone density scan. This is done to screen for osteoporosis. You may have this done starting at age 4. Mammogram. This may be done every 1-2 years. Talk to your health care provider about how often you should  have regular mammograms. Talk with your health care provider about your test results, treatment options, and if necessary, the need for more tests. Vaccines   Your health care provider may recommend certain vaccines, such as: Influenza vaccine. This is recommended every year. Tetanus, diphtheria, and acellular pertussis (Tdap, Td) vaccine. You may need a Td booster every 10 years. Zoster vaccine. You may need this after age 42. Pneumococcal 13-valent conjugate (PCV13) vaccine. One dose is recommended after age 29. Pneumococcal polysaccharide (PPSV23) vaccine. One dose is recommended after age 13. Talk to your health care provider about which screenings and vaccines you need and how often you need them. This information is not intended to replace advice given to you by your health care provider. Make sure you discuss any questions you have with your health care provider. Document Released: 04/08/2015 Document Revised: 11/30/2015 Document Reviewed: 01/11/2015 Elsevier Interactive Patient Education  2017 [place] Prevention in the Home Falls can cause injuries. They can happen to people of all ages. There are many things you can do to make your home safe and to help prevent falls. What can I do on the outside of my home? Regularly fix the edges of walkways and driveways and fix any cracks. Remove anything that might make you trip as you walk through a door, such as a raised step or threshold. Trim any bushes or trees on the path to your home. Use bright outdoor lighting. Clear any walking paths of anything that might make someone trip, such as rocks or tools. Regularly check to see if handrails are loose or broken. Make sure that both sides of any steps have handrails. Any raised decks and porches should have guardrails on the edges. Have any leaves, snow, or ice cleared regularly. Use sand or salt on walking paths during winter. Clean up any spills in your garage right away. This includes oil or grease spills. What can I do in the bathroom? Use night lights. Install grab bars by the toilet and in the tub and shower. Do not use towel  bars as grab bars. Use non-skid mats or decals in the tub or shower. If you need to sit down in the shower, use a plastic, non-slip stool. Keep the floor dry. Clean up any water that spills on the floor as soon as it happens. Remove soap buildup in the tub or shower regularly. Attach bath mats securely with double-sided non-slip rug tape. Do not have throw rugs and other things on the floor that can make you trip. What can I do in the bedroom? Use night lights. Make sure that you have a light by your bed that is easy to reach. Do not use any sheets or blankets that are too big for your bed. They should not hang down onto the floor. Have a firm chair that has side arms. You can use this for support while you get dressed. Do not have throw rugs and other things on the floor that can make you trip. What can I do in the kitchen? Clean up any spills right away. Avoid walking on wet floors. Keep items that you use a lot in easy-to-reach places. If you need to reach something above you, use a strong step stool that has a grab bar. Keep electrical cords out of the way. Do not use floor polish or wax that makes floors slippery. If you must use wax, use non-skid floor wax. Do not have throw rugs and other things on the floor  that can make you trip. What can I do with my stairs? Do not leave any items on the stairs. Make sure that there are handrails on both sides of the stairs and use them. Fix handrails that are broken or loose. Make sure that handrails are as long as the stairways. Check any carpeting to make sure that it is firmly attached to the stairs. Fix any carpet that is loose or worn. Avoid having throw rugs at the top or bottom of the stairs. If you do have throw rugs, attach them to the floor with carpet tape. Make sure that you have a light switch at the top of the stairs and the bottom of the stairs. If you do not have them, ask someone to add them for you. What else can I do to help  prevent falls? Wear shoes that: Do not have high heels. Have rubber bottoms. Are comfortable and fit you well. Are closed at the toe. Do not wear sandals. If you use a stepladder: Make sure that it is fully opened. Do not climb a closed stepladder. Make sure that both sides of the stepladder are locked into place. Ask someone to hold it for you, if possible. Clearly mark and make sure that you can see: Any grab bars or handrails. First and last steps. Where the edge of each step is. Use tools that help you move around (mobility aids) if they are needed. These include: Canes. Walkers. Scooters. Crutches. Turn on the lights when you go into a dark area. Replace any light bulbs as soon as they burn out. Set up your furniture so you have a clear path. Avoid moving your furniture around. If any of your floors are uneven, fix them. If there are any pets around you, be aware of where they are. Review your medicines with your doctor. Some medicines can make you feel dizzy. This can increase your chance of falling. Ask your doctor what other things that you can do to help prevent falls. This information is not intended to replace advice given to you by your health care provider. Make sure you discuss any questions you have with your health care provider. Document Released: 01/06/2009 Document Revised: 08/18/2015 Document Reviewed: 04/16/2014 Elsevier Interactive Patient Education  2017 [employer].

## 2021-05-08 NOTE — Progress Notes (Signed)
Subjective:   Amber Rivas is a 76 y.o. female who presents for Medicare Annual (Subsequent) preventive examination.  Virtual Visit via Telephone Note  I connected with  Amber Rivas on 05/08/21 at 12:00 PM EST by telephone and verified that I am speaking with the correct person using two identifiers.  Location: Patient: Home Provider: [facility] Persons participating in the virtual visit: patient/Nurse Health Advisor   I discussed the limitations, risks, security and privacy concerns of performing an evaluation and management service by telephone and the availability of in person appointments. The patient expressed understanding and agreed to proceed.  Interactive audio and video telecommunications were attempted between this nurse and patient, however failed, due to patient having technical difficulties OR patient did not have access to video capability.  We continued and completed visit with audio only.  Some vital signs may be absent or patient reported.   Amber Rivas E Amber Dayton, LPN   Review of Systems     Cardiac Risk Factors include: advanced age (>35men, >56 women);sedentary lifestyle;obesity (BMI >30kg/m2)     Objective:    Today's Vitals   05/08/21 1202  Weight: 188 lb (85.3 kg)   Body mass index is 31.77 kg/m.  Advanced Directives 05/08/2021 05/05/2020 03/14/2020 02/07/2017 02/04/2017 04/30/2016 08/11/2015  Does Patient Have a Medical Advance Directive? Yes Yes Yes Yes Yes Yes Yes  Type of Paramedic of [place];Living will [place];Living will - [place];Living will [place];Living will Living will -  Does patient want to make changes to medical advance directive? - No - Patient declined - No - Patient declined - - -  Copy of [place] in Chart? No - copy requested No - copy requested - - - - -    Current Medications (verified) Outpatient Encounter Medications as of 05/08/2021   Medication Sig   aspirin EC 81 MG tablet Take 81 mg by mouth daily as needed for mild pain or moderate pain.    B-COMPLEX-C PO Take 1 tablet by mouth daily.   buPROPion (WELLBUTRIN SR) 150 MG 12 hr tablet Take 1 tablet (150 mg total) by mouth 2 (two) times daily.   cholecalciferol (VITAMIN D3) 25 MCG (1000 UNIT) tablet Take 1,000 Units by mouth daily.   levothyroxine (SYNTHROID) 100 MCG tablet TAKE 1 TABLET BY MOUTH EVERY DAY   Multiple Vitamin (MULTIVITAMIN WITH MINERALS) TABS tablet Take 1 tablet by mouth daily.   Omega-3 Fatty Acids (OMEGA 3 PO) Take 1 capsule by mouth daily.    No facility-administered encounter medications on file as of 05/08/2021.    Allergies (verified) Elemental sulfur   History: Past Medical History:  Diagnosis Date   Arthritis    Cancer ([place])    Thyroid   History of hiatal hernia    History of pneumonia    Hyperthyroidism    Migraine    Past Surgical History:  Procedure Laterality Date   ANKLE SURGERY Right    CHOLECYSTECTOMY  2005   COLONOSCOPY N/A 04/28/2015   Procedure: COLONOSCOPY;  Surgeon: Rogene Houston, MD;  Location: [facility];  Service: Endoscopy;  Laterality: N/A;  730   THYROIDECTOMY Left 02/07/2017   Procedure: TOTAL THYROIDECTOMY;  Surgeon: Michael Boston, MD;  Location: [facility];  Service: General;  Laterality: Left;  GENERAL AND LOCAL    Family History  Problem Relation Age of Onset   Colon cancer Brother    Cancer Brother  Lung disease Sister    Hypothyroidism Sister    Cancer Brother    Social History   Socioeconomic History   Marital status: Married    Spouse name: Amber Rivas   Number of children: 4   Years of education: Bachelors Degree   Highest education level: Bachelor's degree (e.g., BA, AB, BS)  Occupational History   Occupation: Retired    Fish farm manager: [employer]  Tobacco Use   Smoking status: Never   Smokeless tobacco: Never  Vaping Use   Vaping Use: Never used  Substance and Sexual Activity    Alcohol use: Yes    Comment: rare   Drug use: No   Sexual activity: Yes    Birth control/protection: Post-menopausal  Other Topics Concern   Not on file  Social History Narrative   Son living with them right now while going through his divorce - 04/2021   Social Determinants of Health   Financial Resource Strain: Low Risk    Difficulty of Paying Living Expenses: Not hard at all  Food Insecurity: No Food Insecurity   Worried About Charity fundraiser in the Last Year: Never true   [place] in the Last Year: Never true  Transportation Needs: No Transportation Needs   Lack of Transportation (Medical): No   Lack of Transportation (Non-Medical): No  Physical Activity: Insufficiently Active   Days of Exercise per Week: 7 days   Minutes of Exercise per Session: 20 min  Stress: No Stress Concern Present   Feeling of Stress : Only a little  Social Connections: Engineer, building services of Communication with Friends and Family: Not on file   Frequency of Social Gatherings with Friends and Family: More than three times a week   Attends Religious Services: 1 to 4 times per year   Active Member of [employer] or Organizations: Yes   Attends Archivist Meetings: 1 to 4 times per year   Marital Status: Married    Tobacco Counseling Counseling given: Not Answered   Clinical Intake:  Pre-visit preparation completed: Yes  Pain : No/denies pain     BMI - recorded: 31.77 Nutritional Status: BMI > 30  Obese Nutritional Risks: None Diabetes: No  How often do you need to have someone help you when you read instructions, pamphlets, or other written materials from your doctor or pharmacy?: 1 - Never  Diabetic? no  Interpreter Needed?: No  Information entered by :: Lafayette Dunlevy, LPN   Activities of Daily Living In your present state of health, do you have any difficulty performing the following activities: 05/08/2021  Hearing? N  Vision? Y  Comment cataracts   Difficulty concentrating or making decisions? N  Walking or climbing stairs? N  Dressing or bathing? N  Doing errands, shopping? N  Preparing Food and eating ? N  Using the Toilet? N  In the past six months, have you accidently leaked urine? N  Do you have problems with loss of bowel control? N  Managing your Medications? N  Managing your Finances? N  Housekeeping or managing your Housekeeping? N  Some recent data might be hidden    Patient Care Team: [name], DO as PCP - General (Family Medicine) Satira Sark, MD as PCP - Cardiology (Cardiology) Renato Shin, MD as Consulting Physician (Endocrinology) Wylene Simmer, MD as Consulting Physician (Orthopedic Surgery) Michael Boston, MD as Consulting Physician (General Surgery)  Indicate any recent [place] you may have received from other than  [facility] providers in the past year (date may be approximate).     Assessment:   This is a routine wellness examination for [place].  Hearing/Vision screen Hearing Screening - Comments:: Denies hearing difficulties   Vision Screening - Comments:: Wears rx glasses - up to date with routine eye exams with Rosana Hoes in [place] - also seeing Dr Katy Fitch for cataracts  Dietary issues and exercise activities discussed: Current Exercise Habits: Home exercise routine, Type of exercise: walking, Time (Minutes): 20, Frequency (Times/Week): 7, Weekly Exercise (Minutes/Week): 140, Intensity: Mild, Exercise limited by: orthopedic condition(s)   Goals Addressed             This Visit's Progress    Patient Stated   On track    05/05/2020 AWV Goal: Exercise for General Health  Patient will verbalize understanding of the benefits of increased physical activity: Exercising regularly is important. It will improve your overall fitness, flexibility, and endurance. Regular exercise also will improve your overall health. It can help you control your weight, reduce stress, and improve your bone  density. Over the next year, patient will increase physical activity as tolerated with a goal of at least 150 minutes of moderate physical activity per week.  You can tell that you are exercising at a moderate intensity if your heart starts beating faster and you start breathing faster but can still hold a conversation. Moderate-intensity exercise ideas include: Walking 1 mile (1.6 km) in about 15 minutes Biking Hiking Golfing Dancing Water aerobics Patient will verbalize understanding of everyday activities that increase physical activity by providing examples like the following: Yard work, such as: Sales promotion account executive Gardening Washing windows or floors Patient will be able to explain general safety guidelines for exercising:  Before you start a new exercise program, talk with your health care provider. Do not exercise so much that you hurt yourself, feel dizzy, or get very short of breath. Wear comfortable clothes and wear shoes with good support. Drink plenty of water while you exercise to prevent dehydration or heat stroke. Work out until your breathing and your heartbeat get faster.        Depression Screen PHQ 2/9 Scores 05/08/2021 04/26/2021 11/15/2020 10/24/2020 05/05/2020 02/13/2019 02/09/2019  PHQ - 2 Score 0 0 0 2 1 1 2   PHQ- 9 Score - - 3 5 - 1 4    Fall Risk Fall Risk  05/08/2021 04/26/2021 11/15/2020 10/24/2020 05/05/2020  Falls in the past year? 0 0 0 0 1  Number falls in past yr: - - - - 1  Injury with Fall? 0 - - - 0  Risk for fall due to : Orthopedic patient - - - History of fall(s)  Follow up Falls prevention discussed - - - Falls evaluation completed    [place]:  Any stairs in or around the home? No  If so, are there any without handrails? No  Home free of loose throw rugs in walkways, pet beds, electrical cords, etc? Yes  Adequate lighting in your  home to reduce risk of falls? Yes   ASSISTIVE DEVICES UTILIZED TO PREVENT FALLS:  Life alert? No  Use of a cane, walker or w/c? No  Grab bars in the bathroom? Yes  Shower chair or bench in shower? Yes  Elevated toilet seat or a handicapped toilet? Yes   TIMED UP AND GO:  Was the test performed? No . Telephonic  visit  Cognitive Function: MMSE - Mini Mental State Exam 12/10/2017  Orientation to time 5  Orientation to Place 5  Registration 3  Attention/ Calculation 5  Recall 3  Language- name 2 objects 2  Language- repeat 1  Language- follow 3 step command 3  Language- read & follow direction 1  Write a sentence 1  Copy design 1  Total score 30     6CIT Screen 05/05/2020  What Year? 0 points  What month? 0 points  What time? 0 points  Count back from 20 0 points  Months in reverse 0 points  Repeat phrase 0 points  Total Score 0    Immunizations Immunization History  Administered Date(s) Administered   Fluad Quad(high Dose 65+) 04/26/2021   Influenza, High Dose Seasonal PF 02/10/2019   Influenza,inj,Quad PF,6+ Mos 01/14/2017   Influenza,inj,quad, With Preservative 02/12/2017   Moderna Sars-Covid-2 Vaccination 05/20/2019, 06/15/2019, 02/09/2020, 07/19/2020, 01/12/2021   Pneumococcal Conjugate-13 02/13/2019   Pneumococcal Polysaccharide-23 10/24/2020   Zoster Recombinat (Shingrix) 10/24/2020    TDAP status: Up to date  Flu Vaccine status: Up to date  Pneumococcal vaccine status: Up to date  Covid-19 vaccine status: Completed vaccines  Qualifies for Shingles Vaccine? Yes   Zostavax completed No   Shingrix Completed?: No.    Education has been provided regarding the importance of this vaccine. Patient has been advised to call insurance company to determine out of pocket expense if they have not yet received this vaccine. Advised may also receive vaccine at local pharmacy or Health Dept. Verbalized acceptance and understanding.  Screening Tests Health  Maintenance  Topic Date Due   Zoster Vaccines- Shingrix (2 of 2) 07/24/2021 (Originally 12/19/2020)   MAMMOGRAM  10/24/2021 (Originally 04/22/1997)   Hepatitis C Screening  10/24/2021 (Originally 04/22/1963)   TETANUS/TDAP  08/24/2021   DEXA SCAN  12/21/2022   COLONOSCOPY (Pts 45-32yrs Insurance coverage will need to be confirmed)  02/01/2026   Pneumonia Vaccine 8+ Years old  Completed   INFLUENZA VACCINE  Completed   COVID-19 Vaccine  Completed   HPV VACCINES  Aged Out    Health Maintenance  There are no preventive care reminders to display for this patient.  Colorectal cancer screening: Type of screening: Colonoscopy. Completed 02/02/2016. Repeat every 10 years  Mammogram status: Ordered 05/08/2021. Pt provided with contact info and advised to call to schedule appt.   Bone Density status: Completed 12/20/2017. Results reflect: Bone density results: NORMAL. Repeat every 5 years.  Lung Cancer Screening: (Low Dose CT Chest recommended if Age 32-80 years, 30 pack-year currently smoking OR have quit w/in 15years.) does not qualify.    Additional Screening:  Hepatitis C Screening: does qualify; Due  Vision Screening: Recommended annual ophthalmology exams for early detection of glaucoma and other disorders of the eye. Is the patient up to date with their annual eye exam?  Yes  Who is the provider or what is the name of the office in which the patient attends annual eye exams? [facility] [place] in [place] If pt is not established with a provider, would they like to be referred to a provider to establish care? No .   Dental Screening: Recommended annual dental exams for proper oral hygiene  Community Resource Referral / Chronic Care Management: CRR required this visit?  No   CCM required this visit?  No      Plan:     I have personally reviewed and noted the following in the patients chart:  Medical and social history Use of alcohol, tobacco or illicit drugs   Current medications and supplements including opioid prescriptions.  Functional ability and status Nutritional status Physical activity Advanced directives List of other physicians Hospitalizations, surgeries, and ER visits in previous 12 months Vitals Screenings to include cognitive, depression, and falls Referrals and appointments  In addition, I have reviewed and discussed with patient certain preventive protocols, quality metrics, and best practice recommendations. A written personalized care plan for preventive services as well as general preventive health recommendations were provided to patient.   Due to this being a telephonic visit, the after visit summary with patients personalized plan was offered to patient via mail or my-chart.  Patient would like to access on my-chart   Sandrea Hammond, LPN   11/11/4035   Nurse Notes: None

## 2021-06-19 ENCOUNTER — Inpatient Hospital Stay: Admission: RE | Admit: 2021-06-19 | Payer: Medicare PPO | Source: Ambulatory Visit

## 2021-07-03 DIAGNOSIS — H50812 Duane's syndrome, left eye: Secondary | ICD-10-CM | POA: Diagnosis not present

## 2021-07-03 DIAGNOSIS — H10413 Chronic giant papillary conjunctivitis, bilateral: Secondary | ICD-10-CM | POA: Diagnosis not present

## 2021-07-03 DIAGNOSIS — H2512 Age-related nuclear cataract, left eye: Secondary | ICD-10-CM | POA: Diagnosis not present

## 2021-07-03 DIAGNOSIS — H43813 Vitreous degeneration, bilateral: Secondary | ICD-10-CM | POA: Diagnosis not present

## 2021-07-06 ENCOUNTER — Encounter: Payer: Self-pay | Admitting: Family Medicine

## 2021-07-06 ENCOUNTER — Telehealth: Payer: Medicare PPO | Admitting: Family Medicine

## 2021-07-06 DIAGNOSIS — G43909 Migraine, unspecified, not intractable, without status migrainosus: Secondary | ICD-10-CM

## 2021-07-06 MED ORDER — NURTEC 75 MG PO TBDP: 75.0000 mg | ORAL_TABLET | ORAL | 0 refills | Status: DC

## 2021-07-06 MED ORDER — SUMATRIPTAN SUCCINATE 25 MG PO TABS
25.0000 mg | ORAL_TABLET | ORAL | 0 refills | Status: DC | PRN
Start: 2021-07-06 — End: 2022-04-19

## 2021-07-06 NOTE — Progress Notes (Signed)
? ?Virtual Visit via Video note ? ?I connected with Amber Rivas on 07/06/21 at 1:20 PM by video and verified that I am speaking with the correct person using two identifiers. Amber Rivas is currently located at home and nobody is currently with her during visit. The provider, Loman Brooklyn, FNP is located in their office at time of visit. ? ?I discussed the limitations, risks, security and privacy concerns of performing an evaluation and management service by video and the availability of in person appointments. I also discussed with the patient that there may be a patient responsible charge related to this service. The patient expressed understanding and agreed to proceed. ? ?Subjective: ?PCP: Amber Norlander, DO ? ?Chief Complaint  ?Patient presents with  ? Headache  ? ?Patient reports her usual migraine starts in the corner of her left eye and then "takes out the whole left side of her face and head". She has to go lie down in the dark and quiet. She has had her present migraine for the past nine days. She has tried OTC Tylenol and Excedrin which are not very helpful currently. In the past she has taken Imitrex which was effective, but reports PCP did not want to continue it due to her age. ? ? ?ROS: Per HPI ? ?Current Outpatient Medications:  ?  aspirin EC 81 MG tablet, Take 81 mg by mouth daily as needed for mild pain or moderate pain. , Disp: , Rfl:  ?  B-COMPLEX-C PO, Take 1 tablet by mouth daily., Disp: , Rfl:  ?  buPROPion (WELLBUTRIN SR) 150 MG 12 hr tablet, Take 1 tablet (150 mg total) by mouth 2 (two) times daily., Disp: 180 tablet, Rfl: 3 ?  cholecalciferol (VITAMIN D3) 25 MCG (1000 UNIT) tablet, Take 1,000 Units by mouth daily., Disp: , Rfl:  ?  levothyroxine (SYNTHROID) 100 MCG tablet, TAKE 1 TABLET BY MOUTH EVERY DAY, Disp: 90 tablet, Rfl: 2 ?  Multiple Vitamin (MULTIVITAMIN WITH MINERALS) TABS tablet, Take 1 tablet by mouth daily., Disp: , Rfl:  ?  Omega-3 Fatty Acids (OMEGA 3 PO), Take 1  capsule by mouth daily. , Disp: , Rfl:  ? ?Allergies  ?Allergen Reactions  ? Elemental Sulfur Anaphylaxis  ? ?Past Medical History:  ?Diagnosis Date  ? Arthritis   ? Cancer [facility])   ? Thyroid  ? History of hiatal hernia   ? History of pneumonia   ? Hyperthyroidism   ? Migraine   ? ? ?Observations/Objective: ?Physical Exam ?Constitutional:   ?   General: She is not in acute distress. ?   Appearance: Normal appearance. She is not ill-appearing or toxic-appearing.  ?Eyes:  ?   General: No scleral icterus.    ?   Right eye: No discharge.     ?   Left eye: No discharge.  ?   Conjunctiva/sclera: Conjunctivae normal.  ?Pulmonary:  ?   Effort: Pulmonary effort is normal. No respiratory distress.  ?Neurological:  ?   Mental Status: She is alert and oriented to person, place, and time.  ?Psychiatric:     ?   Mood and Affect: Mood normal.     ?   Behavior: Behavior normal.     ?   Thought Content: Thought content normal.     ?   Judgment: Judgment normal.  ? ?Assessment and Plan: ?1. Acute migraine ?Uncontrolled. Sending Imitrex for abortive treatment immediately. Discussed Nurtec would be more appropriate due to her age, but this requires  a PA. Advised to schedule a follow-up appointment with PCP to discuss ongoing management.  ?- Rimegepant Sulfate (NURTEC) 75 MG TBDP; Take 75 mg by mouth every other day.  Dispense: 16 tablet; Refill: 0 ?- SUMAtriptan (IMITREX) 25 MG tablet; Take 1 tablet (25 mg total) by mouth every 2 (two) hours as needed for migraine. May repeat in 2 hours if headache persists or recurs.  Dispense: 10 tablet; Refill: 0 ? ? ?Follow Up Instructions: ?Return with PCP, for migraines. ?  ?I discussed the assessment and treatment plan with the patient. The patient was provided an opportunity to ask questions and all were answered. The patient agreed with the plan and demonstrated an understanding of the instructions. ?  ?The patient was advised to call back or seek an in-person evaluation if the symptoms worsen  or if the condition fails to improve as anticipated. ? ?The above assessment and management plan was discussed with the patient. The patient verbalized understanding of and has agreed to the management plan. Patient is aware to call the clinic if symptoms persist or worsen. Patient is aware when to return to the clinic for a follow-up visit. Patient educated on when it is appropriate to go to the emergency department.  ? ?Time call ended: 1:31 PM ? ?I provided 11 minutes of face-to-face time during this encounter. ? ? ?Hendricks Limes, MSN, APRN, FNP-C ?[place] ?07/06/21 ? ?

## 2021-07-07 ENCOUNTER — Telehealth: Payer: Self-pay | Admitting: Family Medicine

## 2021-07-07 NOTE — Telephone Encounter (Signed)
Your information has been sent to [facility] ?

## 2021-07-08 DIAGNOSIS — J069 Acute upper respiratory infection, unspecified: Secondary | ICD-10-CM | POA: Diagnosis not present

## 2021-07-08 DIAGNOSIS — J029 Acute pharyngitis, unspecified: Secondary | ICD-10-CM | POA: Diagnosis not present

## 2021-07-10 ENCOUNTER — Encounter: Payer: Self-pay | Admitting: Family Medicine

## 2021-07-10 ENCOUNTER — Ambulatory Visit: Payer: Medicare PPO | Admitting: Family Medicine

## 2021-07-10 DIAGNOSIS — J029 Acute pharyngitis, unspecified: Secondary | ICD-10-CM

## 2021-07-10 LAB — RAPID STREP SCREEN (MED CTR MEBANE ONLY): Strep Gp A Ag, IA W/Reflex: NEGATIVE

## 2021-07-10 LAB — CULTURE, GROUP A STREP

## 2021-07-10 MED ORDER — AMOXICILLIN-POT CLAVULANATE 875-125 MG PO TABS: 1.0000 | ORAL_TABLET | Freq: Two times a day (BID) | ORAL | 0 refills | Status: DC

## 2021-07-10 NOTE — Progress Notes (Signed)
Telephone visit ? ?Subjective: ?CC: URI ?PCP: Amber Norlander, DO ?CWU:Amber Rivas is a 76 y.o. female calls for telephone consult today. Patient provides verbal consent for consult held via phone. ? ?Due to COVID-19 pandemic this visit was conducted virtually. This visit type was conducted due to national recommendations for restrictions regarding the COVID-19 Pandemic (e.g. social distancing, sheltering in place) in an effort to limit this patient's exposure and mitigate transmission in our community. All issues noted in this document were discussed and addressed.  A physical exam was not performed with this format.  ? ?Location of patient: home ?Location of provider: [facility] ?Others present for call: none ? ?1. URI ?Patient reports fever and sore throat that onset Thursday.  She went [facility] urgent care Sat am and had a strep/ flu that were negative.  She was not put on any medications. She is using OTC robitussin cough and magic mouthwash w/ lidocaine but it is not helping.  Also using Motrin and Tylenol but again without much improvement.  She denies drainage, cough. ? ? ?ROS: Per HPI ? ?Allergies  ?Allergen Reactions  ? Elemental Sulfur Anaphylaxis  ? ?Past Medical History:  ?Diagnosis Date  ? Arthritis   ? Cancer [facility])   ? Thyroid  ? History of hiatal hernia   ? History of pneumonia   ? Hyperthyroidism   ? Migraine   ? ? ?Current Outpatient Medications:  ?  aspirin EC 81 MG tablet, Take 81 mg by mouth daily as needed for mild pain or moderate pain. , Disp: , Rfl:  ?  B-COMPLEX-C PO, Take 1 tablet by mouth daily., Disp: , Rfl:  ?  buPROPion (WELLBUTRIN SR) 150 MG 12 hr tablet, Take 1 tablet (150 mg total) by mouth 2 (two) times daily., Disp: 180 tablet, Rfl: 3 ?  cholecalciferol (VITAMIN D3) 25 MCG (1000 UNIT) tablet, Take 1,000 Units by mouth daily., Disp: , Rfl:  ?  levothyroxine (SYNTHROID) 100 MCG tablet, TAKE 1 TABLET BY MOUTH EVERY DAY, Disp: 90 tablet, Rfl: 2 ?  Multiple Vitamin (MULTIVITAMIN WITH  MINERALS) TABS tablet, Take 1 tablet by mouth daily., Disp: , Rfl:  ?  Omega-3 Fatty Acids (OMEGA 3 PO), Take 1 capsule by mouth daily. , Disp: , Rfl:  ?  Rimegepant Sulfate (NURTEC) 75 MG TBDP, Take 75 mg by mouth every other day., Disp: 16 tablet, Rfl: 0 ?  SUMAtriptan (IMITREX) 25 MG tablet, Take 1 tablet (25 mg total) by mouth every 2 (two) hours as needed for migraine. May repeat in 2 hours if headache persists or recurs., Disp: 10 tablet, Rfl: 0 ? ?Assessment/ Plan: ?76 y.o. female  ? ?Sore throat - Plan: amoxicillin-clavulanate (AUGMENTIN) 875-125 MG tablet, Culture, Group A Strep, Rapid Strep Screen ([facility] ONLY) ? ?Question possible false negative for strep given ongoing symptoms despite multiple modalities of treatment and lack of other drainage type symptoms that would make me think more along the lines of allergy mediated versus viral mediated symptoms. ? ?Start time: 9:07am ?End time: 9:12am ? ?Total time spent on patient care (including telephone call/ virtual visit): 5 minutes ? ?Amber Norlander, DO ?[place] ?[phone] ? ?

## 2021-07-10 NOTE — Patient Instructions (Signed)
Sore Throat When you have a sore throat, your throat may feel: Tender. Burning. Irritated. Scratchy. Painful when you swallow. Painful when you talk. Many things can cause a sore throat, such as: An infection. Allergies. Dry air. Smoke or pollution. Radiation treatment for cancer. Gastroesophageal reflux disease (GERD). A tumor. A sore throat can be the first sign of another sickness. It can happen with other problems, like: Coughing. Sneezing. Fever. Swelling of the glands in the neck. Most sore throats go away without treatment. Follow these instructions at home:     Medicines Take over-the-counter and prescription medicines only as told by your doctor. Children often get sore throats. Do not give your child aspirin. Use throat sprays to soothe your throat as told by your health care provider. Managing pain To help with pain: Sip warm liquids, such as broth, herbal tea, or warm water. Eat or drink cold or frozen liquids, such as frozen ice pops. Rinse your mouth (gargle) with a salt water mixture 3-4 times a day or as needed. To make salt water, dissolve -1 tsp (3-6 g) of salt in 1 cup (237 mL) of warm water. Do not swallow this mixture. Suck on hard candy or throat lozenges. Put a cool-mist humidifier in your bedroom at night. Sit in the bathroom with the door closed for 5-10 minutes while you run hot water in the shower. General instructions Do not smoke or use any products that contain nicotine or tobacco. If you need help quitting, ask your doctor. Get plenty of rest. Drink enough fluid to keep your pee (urine) pale yellow. Wash your hands often for at least 20 seconds with soap and water. If soap and water are not available, use hand sanitizer. Contact a doctor if: You have a fever for more than 2-3 days. You keep having symptoms for more than 2-3 days. Your throat does not get better in 7 days. You have a fever and your symptoms suddenly get worse. Your  child who is 3 months to 3 years old has a temperature of 102.2F (39C) or higher. Get help right away if: You have trouble breathing. You cannot swallow fluids, soft foods, or your spit. You have swelling in your throat or neck that gets worse. You feel like you may vomit (nauseous) and this feeling lasts a long time. You cannot stop vomiting. These symptoms may be an emergency. Get help right away. Call your local emergency services (911 in the [place] Do not wait to see if the symptoms will go away. Do not drive yourself to the hospital. Summary A sore throat is a painful, burning, irritated, or scratchy throat. Many things can cause a sore throat. Take over-the-counter medicines only as told by your doctor. Get plenty of rest. Drink enough fluid to keep your pee (urine) pale yellow. Contact a doctor if your symptoms get worse or your sore throat does not get better within 7 days. This information is not intended to replace advice given to you by your health care provider. Make sure you discuss any questions you have with your health care provider. Document Revised: 06/08/2020 Document Reviewed: 06/08/2020 Elsevier Patient Education  2023 Elsevier Inc.  

## 2021-07-10 NOTE — Addendum Note (Signed)
Addended by: [name] on: 07/10/2021 04:21 PM ? ? Modules accepted: Orders ? ?

## 2021-07-10 NOTE — Telephone Encounter (Signed)
Denied on April 16 ?We cover this drug when our criteria are met. The unmet criteria are: has tried or cannot use TWO of the following: Aimovig, Emgality, Qulipta. This decision was from [employer] [facility]urtec [facility] (rimegepant) Pharmacy Coverage Policy ?

## 2021-07-10 NOTE — Telephone Encounter (Signed)
It looks like this was discussed with the patient on 58 by [place].  Patient is aware that this requires PA and she would likely have to trial something else before this is covered.  She has a May appointment with me and we can discuss this further at that time unless she would like to be seen sooner ?

## 2021-07-12 LAB — CULTURE, GROUP A STREP: Strep A Culture: NEGATIVE

## 2021-07-17 ENCOUNTER — Ambulatory Visit
Admission: RE | Admit: 2021-07-17 | Discharge: 2021-07-17 | Disposition: A | Discharge status: Home / Self Care | Payer: Medicare PPO | Source: Ambulatory Visit | Attending: Family Medicine | Admitting: Family Medicine

## 2021-07-17 DIAGNOSIS — Z1231 Encounter for screening mammogram for malignant neoplasm of breast: Secondary | ICD-10-CM

## 2021-07-28 ENCOUNTER — Encounter: Payer: Self-pay | Admitting: Family Medicine

## 2021-07-28 ENCOUNTER — Ambulatory Visit: Payer: Medicare PPO | Admitting: Family Medicine

## 2021-07-28 VITALS — BP 132/85 | HR 80 | Temp 98.1°F | Ht 64.5 in | Wt 190.2 lb

## 2021-07-28 DIAGNOSIS — F439 Reaction to severe stress, unspecified: Secondary | ICD-10-CM

## 2021-07-28 DIAGNOSIS — G43709 Chronic migraine without aura, not intractable, without status migrainosus: Secondary | ICD-10-CM | POA: Diagnosis not present

## 2021-07-28 DIAGNOSIS — J9801 Acute bronchospasm: Secondary | ICD-10-CM

## 2021-07-28 DIAGNOSIS — F339 Major depressive disorder, recurrent, unspecified: Secondary | ICD-10-CM | POA: Diagnosis not present

## 2021-07-28 MED ORDER — AMITRIPTYLINE HCL 25 MG PO TABS: 25.0000 mg | ORAL_TABLET | Freq: Every day | ORAL | 1 refills | Status: DC

## 2021-07-28 MED ORDER — ALBUTEROL SULFATE HFA 108 (90 BASE) MCG/ACT IN AERS: 2.0000 | INHALATION_SPRAY | Freq: Four times a day (QID) | RESPIRATORY_TRACT | 0 refills | Status: DC | PRN

## 2021-07-28 NOTE — Patient Instructions (Signed)
Form - Headache Record There are many types and causes of headaches. A headache record can help guide your treatment plan. Use this form to record the details. Bring this form with you to your follow-up visits. Follow your health care provider's instructions on how to describe your headache. You may be asked to: Use a pain scale. This is a tool to rate the intensity of your headache using words or numbers. Describe what your headache feels like, such as dull, achy, throbbing, or sharp. Headache record Date: _______________ Time (from start to end): ____________________ Location of the headache: _________________________ Intensity of the headache: ____________________ Description of the headache: ______________________________________________________________ Hours of sleep the night before the headache: __________ Food or drinks before the headache started: ______________________________________________________________________________________ Events before the headache started: _______________________________________________________________________________________________ Symptoms before the headache started: __________________________________________________________________________________________ Symptoms during the headache: __________________________________________________________________________________________________ Treatment: ________________________________________________________________________________________________________________ Effect of treatment: _________________________________________________________________________________________________________ Other comments: ___________________________________________________________________________________________________________ Date: _______________ Time (from start to end): ____________________ Location of the headache: _________________________ Intensity of the headache: ____________________ Description of the headache:  ______________________________________________________________ Hours of sleep the night before the headache: __________ Food or drinks before the headache started: ______________________________________________________________________________________ Events before the headache started: ____________________________________________________________________________________________ Symptoms before the headache started: _________________________________________________________________________________________ Symptoms during the headache: _______________________________________________________________________________________________ Treatment: ________________________________________________________________________________________________________________ Effect of treatment: _________________________________________________________________________________________________________ Other comments: ___________________________________________________________________________________________________________ Date: _______________ Time (from start to end): ____________________ Location of the headache: _________________________ Intensity of the headache: ____________________ Description of the headache: ______________________________________________________________ Hours of sleep the night before the headache: __________ Food or drinks before the headache started: ______________________________________________________________________________________ Events before the headache started: ____________________________________________________________________________________________ Symptoms before the headache started: _________________________________________________________________________________________ Symptoms during the headache: _______________________________________________________________________________________________ Treatment:  ________________________________________________________________________________________________________________ Effect of treatment: _________________________________________________________________________________________________________ Other comments: ___________________________________________________________________________________________________________ Date: _______________ Time (from start to end): ____________________ Location of the headache: _________________________ Intensity of the headache: ____________________ Description of the headache: ______________________________________________________________ Hours of sleep the night before the headache: _________ Food or drinks before the headache started: ______________________________________________________________________________________ Events before the headache started: ____________________________________________________________________________________________ Symptoms before the headache started: _________________________________________________________________________________________ Symptoms during the headache: _______________________________________________________________________________________________ Treatment: ________________________________________________________________________________________________________________ Effect of treatment: _________________________________________________________________________________________________________ Other comments: ___________________________________________________________________________________________________________ Date: _______________ Time (from start to end): ____________________ Location of the headache: _________________________ Intensity of the headache: ____________________ Description of the headache: ______________________________________________________________ Hours of sleep the night before the headache: _________ Food or drinks before the headache started:  ______________________________________________________________________________________ Events before the headache started: ____________________________________________________________________________________________ Symptoms before the headache started: _________________________________________________________________________________________ Symptoms during the headache: _______________________________________________________________________________________________ Treatment: ________________________________________________________________________________________________________________ Effect of treatment: _________________________________________________________________________________________________________ Other comments: ___________________________________________________________________________________________________________ This information is not intended to replace advice given to you by your health care provider. Make sure you discuss any questions you have with your health care provider. Document Revised: 08/10/2020 Document Reviewed: 08/10/2020 Elsevier Patient Education  2023 Elsevier Inc.  

## 2021-07-28 NOTE — Progress Notes (Signed)
? ?Subjective: ?FB:PZWCHENI headache follow up ?PCP: [name], DO ?DPO:Amber Rivas is a 76 y.o. female presenting to clinic today for: ? ?1. Migraine headache ?Was seen by brittney joyce a month ago and was started on PRN Imitrex as Nurtec was not covered.  Patient reports history of migraine headaches.  She does report increased stress which may be leading to increased headache frequency.  Her son is residing with her and smokes.  She feels like that cigarette smoke even may be a trigger for her.  The Imitrex unfortunately causes excessive drowsiness.  She has been limiting use of this but does report 2 migraine headaches this past week that she just tried to deal with. ? ?2.  Bronchospasm ?Patient reports bronchospasm.  Sometimes it feels like it is hard to take a deep breath then.  She reports this seems to be more prevalent with exposure to smoke and with the season change.  When she sits in her car in the air conditioning this seems to get better.  She had some old albuterol that she has been utilizing and this does seem to help. ? ? ?ROS: Per HPI ? ?Allergies  ?Allergen Reactions  ? Elemental Sulfur Anaphylaxis  ? ?Past Medical History:  ?Diagnosis Date  ? Arthritis   ? Cancer [facility])   ? Thyroid  ? History of hiatal hernia   ? History of pneumonia   ? Hyperthyroidism   ? Migraine   ? ? ?Current Outpatient Medications:  ?  aspirin EC 81 MG tablet, Take 81 mg by mouth daily as needed for mild pain or moderate pain. , Disp: , Rfl:  ?  B-COMPLEX-C PO, Take 1 tablet by mouth daily., Disp: , Rfl:  ?  buPROPion (WELLBUTRIN SR) 150 MG 12 hr tablet, Take 1 tablet (150 mg total) by mouth 2 (two) times daily., Disp: 180 tablet, Rfl: 3 ?  cholecalciferol (VITAMIN D3) 25 MCG (1000 UNIT) tablet, Take 1,000 Units by mouth daily., Disp: , Rfl:  ?  levothyroxine (SYNTHROID) 100 MCG tablet, TAKE 1 TABLET BY MOUTH EVERY DAY, Disp: 90 tablet, Rfl: 2 ?  Multiple Vitamin (MULTIVITAMIN WITH MINERALS) TABS tablet, Take 1  tablet by mouth daily., Disp: , Rfl:  ?  Omega-3 Fatty Acids (OMEGA 3 PO), Take 1 capsule by mouth daily. , Disp: , Rfl:  ?  SUMAtriptan (IMITREX) 25 MG tablet, Take 1 tablet (25 mg total) by mouth every 2 (two) hours as needed for migraine. May repeat in 2 hours if headache persists or recurs., Disp: 10 tablet, Rfl: 0 ?Social History  ? ?Socioeconomic History  ? Marital status: Married  ?  Spouse name: [name]  ? Number of children: 4  ? Years of education: Bachelors Degree  ? Highest education level: Bachelor's degree (e.g., BA, AB, BS)  ?Occupational History  ? Occupation: Retired  ?  Employer: [employer]  ?Tobacco Use  ? Smoking status: Never  ? Smokeless tobacco: Never  ?Vaping Use  ? Vaping Use: Never used  ?Substance and Sexual Activity  ? Alcohol use: Yes  ?  Comment: rare  ? Drug use: No  ? Sexual activity: Yes  ?  Birth control/protection: Post-menopausal  ?Other Topics Concern  ? Not on file  ?Social History Narrative  ? Son living with them right now while going through his divorce - 04/2021  ? ?Social Determinants of Health  ? ?Financial Resource Strain: Low Risk   ? Difficulty of Paying Living Expenses: Not hard at  all  ?Food Insecurity: No Food Insecurity  ? Worried About Charity fundraiser in the Last Year: Never true  ? Ran Out of Food in the Last Year: Never true  ?Transportation Needs: No Transportation Needs  ? Lack of Transportation (Medical): No  ? Lack of Transportation (Non-Medical): No  ?Physical Activity: Insufficiently Active  ? Days of Exercise per Week: 7 days  ? Minutes of Exercise per Session: 20 min  ?Stress: No Stress Concern Present  ? Feeling of Stress : Only a little  ?Social Connections: Socially Integrated  ? Frequency of Communication with Friends and Family: Not on file  ? Frequency of Social Gatherings with Friends and Family: More than three times a week  ? Attends Religious Services: 1 to 4 times per year  ? Active Member of Clubs or Organizations: Yes  ?  Attends Archivist Meetings: 1 to 4 times per year  ? Marital Status: Married  ?Intimate Partner Violence: Not At Risk  ? Fear of Current or Ex-Partner: No  ? Emotionally Abused: No  ? Physically Abused: No  ? Sexually Abused: No  ? ?Family History  ?Problem Relation Age of Onset  ? Lung disease Sister   ? Hypothyroidism Sister   ? Breast cancer Maternal Aunt   ? Colon cancer Brother   ? Cancer Brother   ? Cancer Brother   ? ? ?Objective: ?Office vital signs reviewed. ?BP 132/85   Pulse 80   Temp 98.1 ?F (36.7 ?C)   Ht 5' 4.5" (1.638 m)   Wt 190 lb 3.2 oz (86.3 kg)   SpO2 95%   BMI 32.14 kg/m?  ? ?Physical Examination:  ?General: Awake, alert, well nourished, No acute distress ?HEENT: sclera white, MMM ?Cardio: regular rate and rhythm, S1S2 heard, no murmurs appreciated ?Pulm: clear to auscultation bilaterally, no wheezes, rhonchi or rales; normal work of breathing on room air ?Neuro: No focal neurologic deficits ?Psych: Mood stable, speech normal ? ?Assessment/ Plan: ?76 y.o. female  ? ?Depression, recurrent ([place]) - Plan: amitriptyline (ELAVIL) 25 MG tablet ? ?Stress at home - Plan: amitriptyline (ELAVIL) 25 MG tablet ? ?Chronic migraine without aura without status migrainosus, not intractable - Plan: amitriptyline (ELAVIL) 25 MG tablet ? ?Bronchospasm - Plan: albuterol (VENTOLIN HFA) 108 (90 Base) MCG/ACT inhaler ? ?Elavil rx'd at low dose.   QTc normal.  Hopefully this will help with mood, migraine and fatigue.  Advised to take at bedtime.  Migraine headache diary provided.  We will reassess in 1 month, sooner if concerns arise ? ?Albuterol inhaler provided.  If utilizing more than 3 times a week we will consider chronic daily inhaler ? ?No orders of the defined types were placed in this encounter. ? ?No orders of the defined types were placed in this encounter. ? ? ? ?[name], DO ?[place] ?[phone] ? ?

## 2021-08-24 DIAGNOSIS — H2512 Age-related nuclear cataract, left eye: Secondary | ICD-10-CM | POA: Diagnosis not present

## 2021-08-28 ENCOUNTER — Ambulatory Visit: Payer: Medicare PPO | Admitting: Family Medicine

## 2021-09-03 DIAGNOSIS — H2511 Age-related nuclear cataract, right eye: Secondary | ICD-10-CM | POA: Diagnosis not present

## 2021-09-07 DIAGNOSIS — H2511 Age-related nuclear cataract, right eye: Secondary | ICD-10-CM | POA: Diagnosis not present

## 2021-12-19 ENCOUNTER — Ambulatory Visit (INDEPENDENT_AMBULATORY_CARE_PROVIDER_SITE_OTHER): Payer: Medicare PPO | Admitting: Family Medicine

## 2021-12-19 ENCOUNTER — Encounter: Payer: Self-pay | Admitting: Family Medicine

## 2021-12-19 DIAGNOSIS — N390 Urinary tract infection, site not specified: Secondary | ICD-10-CM

## 2021-12-19 LAB — URINALYSIS, ROUTINE W REFLEX MICROSCOPIC
Bilirubin, UA: NEGATIVE
Glucose, UA: NEGATIVE
Ketones, UA: NEGATIVE
Nitrite, UA: NEGATIVE
Specific Gravity, UA: 1.01 (ref 1.005–1.030)
Urobilinogen, Ur: 0.2 mg/dL (ref 0.2–1.0)
pH, UA: 6 (ref 5.0–7.5)

## 2021-12-19 LAB — MICROSCOPIC EXAMINATION
Epithelial Cells (non renal): NONE SEEN /hpf (ref 0–10)
Renal Epithel, UA: NONE SEEN /hpf

## 2021-12-19 MED ORDER — CIPROFLOXACIN HCL 250 MG PO TABS: 250.0000 mg | ORAL_TABLET | Freq: Two times a day (BID) | ORAL | 0 refills | Status: DC

## 2021-12-19 NOTE — Progress Notes (Signed)
Subjective:    Patient ID: Amber Rivas, female    DOB: 04-20-1945, 76 y.o.   MRN: [id number]   HPI: Amber Rivas is a 76 y.o. female presenting for burning with urination and frequency for 3 days. Denies fever . No flank pain. No nausea, vomiting. Just finished amoxil for a tooth infection.      07/28/2021    1:59 PM 05/08/2021   12:07 PM 04/26/2021    1:14 PM 11/15/2020    8:53 AM 10/24/2020    3:45 PM  Depression screen PHQ 2/9  Decreased Interest 0 0 0 0 1  Down, Depressed, Hopeless 1 0 0 0 1  PHQ - 2 Score 1 0 0 0 2  Altered sleeping 2   0 0  Tired, decreased energy '2   1 1  '$ Change in appetite 1   0 1  Feeling bad or failure about yourself  '1   1 1  '$ Trouble concentrating 0   1 0  Moving slowly or fidgety/restless 0   0 0  Suicidal thoughts 0   0 0  PHQ-9 Score '7   3 5  '$ Difficult doing work/chores Somewhat difficult         Relevant past medical, surgical, family and social history reviewed and updated as indicated.  Interim medical history since our last visit reviewed. Allergies and medications reviewed and updated.  ROS:  Review of Systems  Constitutional:  Negative for chills, diaphoresis and fever.  HENT:  Negative for congestion.   Eyes:  Negative for visual disturbance.  Respiratory:  Negative for cough and shortness of breath.   Cardiovascular:  Negative for chest pain and palpitations.  Gastrointestinal:  Negative for constipation, diarrhea and nausea.  Genitourinary:  Positive for dysuria, frequency and urgency. Negative for decreased urine volume, flank pain, hematuria, menstrual problem and pelvic pain.  Musculoskeletal:  Negative for arthralgias and joint swelling.  Skin:  Negative for rash.  Neurological:  Negative for dizziness and numbness.     Social History   Tobacco Use  Smoking Status Never  Smokeless Tobacco Never   UA: 11-30 WBC, 0-2 RBC, few bacteria. Di pshowed 3+ blood, 2+ Leu, Nitrite negative    Objective:     Wt Readings from  Last 3 Encounters:  07/28/21 190 lb 3.2 oz (86.3 kg)  05/08/21 188 lb (85.3 kg)  04/26/21 188 lb (85.3 kg)     Exam deferred. Pt. Harboring due to COVID 19. Phone visit performed.   Assessment & Plan:   1. Urinary tract infection without hematuria, site unspecified     Meds ordered this encounter  Medications   ciprofloxacin (CIPRO) 250 MG tablet    Sig: Take 1 tablet (250 mg total) by mouth 2 (two) times daily.    Dispense:  14 tablet    Refill:  0    Orders Placed This Encounter  Procedures   Urinalysis, Routine w reflex microscopic      Diagnoses and all orders for this visit:  Urinary tract infection without hematuria, site unspecified -     Urinalysis, Routine w reflex microscopic  Other orders -     ciprofloxacin (CIPRO) 250 MG tablet; Take 1 tablet (250 mg total) by mouth 2 (two) times daily.    Virtual Visit via telephone Note  I discussed the limitations, risks, security and privacy concerns of performing an evaluation and management service by telephone and the availability of in person appointments. The patient was identified with  two identifiers. Pt.expressed understanding and agreed to proceed. Pt. Is at home. Dr. Livia Snellen is in his office.  Follow Up Instructions:   I discussed the assessment and treatment plan with the patient. The patient was provided an opportunity to ask questions and all were answered. The patient agreed with the plan and demonstrated an understanding of the instructions.   The patient was advised to call back or seek an in-person evaluation if the symptoms worsen or if the condition fails to improve as anticipated.   Total minutes including chart review and phone contact time: 11   Follow up plan: Return if symptoms worsen or fail to improve.  Claretta Fraise, MD [place]

## 2022-01-26 ENCOUNTER — Other Ambulatory Visit: Payer: Self-pay | Admitting: Family Medicine

## 2022-01-26 DIAGNOSIS — E890 Postprocedural hypothyroidism: Secondary | ICD-10-CM

## 2022-02-28 DIAGNOSIS — H5089 Other specified strabismus: Secondary | ICD-10-CM | POA: Insufficient documentation

## 2022-03-01 DIAGNOSIS — H02834 Dermatochalasis of left upper eyelid: Secondary | ICD-10-CM | POA: Diagnosis not present

## 2022-03-01 DIAGNOSIS — H02831 Dermatochalasis of right upper eyelid: Secondary | ICD-10-CM | POA: Diagnosis not present

## 2022-03-01 DIAGNOSIS — H02413 Mechanical ptosis of bilateral eyelids: Secondary | ICD-10-CM | POA: Diagnosis not present

## 2022-03-01 DIAGNOSIS — H53483 Generalized contraction of visual field, bilateral: Secondary | ICD-10-CM | POA: Diagnosis not present

## 2022-03-01 DIAGNOSIS — H57813 Brow ptosis, bilateral: Secondary | ICD-10-CM | POA: Diagnosis not present

## 2022-03-01 DIAGNOSIS — H0279 Other degenerative disorders of eyelid and periocular area: Secondary | ICD-10-CM | POA: Diagnosis not present

## 2022-04-16 DIAGNOSIS — H43813 Vitreous degeneration, bilateral: Secondary | ICD-10-CM | POA: Diagnosis not present

## 2022-04-19 ENCOUNTER — Ambulatory Visit: Payer: Medicare PPO | Attending: Nurse Practitioner | Admitting: Nurse Practitioner

## 2022-04-19 ENCOUNTER — Encounter: Payer: Self-pay | Admitting: Nurse Practitioner

## 2022-04-19 VITALS — BP 120/80 | HR 73 | Ht 64.5 in | Wt 183.2 lb

## 2022-04-19 DIAGNOSIS — Z87898 Personal history of other specified conditions: Secondary | ICD-10-CM | POA: Diagnosis not present

## 2022-04-19 DIAGNOSIS — E6609 Other obesity due to excess calories: Secondary | ICD-10-CM | POA: Diagnosis not present

## 2022-04-19 DIAGNOSIS — R002 Palpitations: Secondary | ICD-10-CM

## 2022-04-19 DIAGNOSIS — E66811 Obesity, class 1: Secondary | ICD-10-CM

## 2022-04-19 DIAGNOSIS — R9431 Abnormal electrocardiogram [ECG] [EKG]: Secondary | ICD-10-CM

## 2022-04-19 MED ORDER — METOPROLOL SUCCINATE ER 25 MG PO TB24: 12.5000 mg | ORAL_TABLET | Freq: Every day | ORAL | 6 refills | Status: DC

## 2022-04-19 NOTE — Patient Instructions (Addendum)
Medication Instructions:  Begin Torprol XL 12.'5mg'$  daily  Continue all other medications.     Labwork: none  Testing/Procedures: none  Follow-Up: 6 months - Dr. Domenic Polite   Any Other Special Instructions Will Be Listed Below (If Applicable). You have been referred to Prep program - [place]   If you need a refill on your cardiac medications before your next appointment, please call your pharmacy.

## 2022-04-19 NOTE — Progress Notes (Signed)
Cardiology Office Note:    Date:  04/19/2022   ID:  Amber Rivas, Amber Rivas 05-29-1945, MRN [id number]  PCP:  [name], [place] [facility] Cardiologist:  Rozann Lesches, MD     Referring MD: [name], DO   CC: Here for follow-up  History of Present Illness:    Amber Rivas is a 77 y.o. female with a hx of the following:  History of chest pain History of syncope Thyroid cancer, status post thyroidectomy->hypothyroidism Epigastric pain/dysphagia Elevated BP Migraines  Patient is a 77 year old female with past medical history as mentioned above.  Underwent NST in January 2022 that was normal.  Had a syncopal episode in August 2022 while at a wedding, went to ER in [place].  Episode was preceded by GI discomfort.  She started to become nauseated, diaphoretic, and passed out.  Several medical professionals present at the wedding noted her heart rate to be low.  She has had no recurrence in symptoms.  Her PCP ordered lab work that was overall normal.  Patient was seen at [place], EKG showed normal sinus rhythm.  EKG that was previously performed by EMS showed sinus rhythm with widespread ST abnormality  Last seen by Levell July on November 18, 2020.  She denied any recurrent syncopal episodes.  She was referred to [facility] gastroenterology for epigastric pain and dysphagia.  ZIO monitor was arranged and revealed average heart rate of 76 bpm, rare PACs representing less than 1% total beats, rare PVCs, 9 brief episodes of SVT, longest of which lasted 7 beats, a run of an SVT versus aberrantly conducted SVT lasting 12 beats, no pauses or sustained arrhythmias.  Today she presents for overdue follow-up.  She states she is feeling good today. Had an episode yesterday where she felt heart palpitations. HR went up to 137, lasted for about 30 minutes. Took baby ASA and husband's Ativan and after 30 minutes, went away. Denies any recurrence. Denies  any chest pain, shortness of breath, syncope, presyncope, dizziness, orthopnea, PND, swelling or significant weight changes, acute bleeding, or claudication. She is working on exercise and losing weight, has lost 5 lbs recently.   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   Past Medical History:  Diagnosis Date   Arthritis    Cancer [facility])    Thyroid   History of hiatal hernia    History of pneumonia    Hyperthyroidism    Migraine     Past Surgical History:  Procedure Laterality Date   ANKLE SURGERY Right    BREAST EXCISIONAL BIOPSY Right    over 20 years ago   CHOLECYSTECTOMY  2005   COLONOSCOPY N/A 04/28/2015   Procedure: COLONOSCOPY;  Surgeon: Rogene Houston, MD;  Location: [facility] ENDO SUITE;  Service: Endoscopy;  Laterality: N/A;  730   THYROIDECTOMY Left 02/07/2017   Procedure: TOTAL THYROIDECTOMY;  Surgeon: Michael Boston, MD;  Location: [facility] ORS;  Service: General;  Laterality: Left;  GENERAL AND LOCAL     Current Medications: Current Meds  Medication Sig   aspirin EC 81 MG tablet Take 81 mg by mouth daily as needed for mild pain or moderate pain.    B-COMPLEX-C PO Take 1 tablet by mouth daily.   buPROPion (WELLBUTRIN SR) 150 MG 12 hr tablet Take 1 tablet (150 mg total) by mouth 2 (two) times daily.   levothyroxine (SYNTHROID) 100 MCG tablet TAKE 1 TABLET BY MOUTH EVERY DAY   metoprolol succinate (TOPROL XL) 25  MG 24 hr tablet Take 0.5 tablets (12.5 mg total) by mouth daily.   Multiple Vitamin (MULTIVITAMIN WITH MINERALS) TABS tablet Take 1 tablet by mouth daily.   Omega-3 Fatty Acids (OMEGA 3 PO) Take 1 capsule by mouth daily.    [DISCONTINUED] albuterol (VENTOLIN HFA) 108 (90 Base) MCG/ACT inhaler Inhale 2 puffs into the lungs every 6 (six) hours as needed for wheezing or shortness of breath.     Allergies:   Elemental sulfur   Social History   Socioeconomic History   Marital status: Married    Spouse name: [name]   Number of children: 4   Years of education: Bachelors Degree    Highest education level: Bachelor's degree (e.g., BA, AB, BS)  Occupational History   Occupation: Retired    Fish farm manager: [employer]  Tobacco Use   Smoking status: Never   Smokeless tobacco: Never  Vaping Use   Vaping Use: Never used  Substance and Sexual Activity   Alcohol use: Yes    Comment: rare   Drug use: No   Sexual activity: Yes    Birth control/protection: Post-menopausal  Other Topics Concern   Not on file  Social History Narrative   Son living with them right now while going through his divorce - 04/2021   Social Determinants of Health   Financial Resource Strain: Low Risk  (05/08/2021)   Overall Financial Resource Strain (CARDIA)    Difficulty of Paying Living Expenses: Not hard at all  Food Insecurity: No Food Insecurity (05/08/2021)   Hunger Vital Sign    Worried About Running Out of Food in the Last Year: Never true    Ran Out of Food in the Last Year: Never true  Transportation Needs: No Transportation Needs (05/08/2021)   PRAPARE - Hydrologist (Medical): No    Lack of Transportation (Non-Medical): No  Physical Activity: Insufficiently Active (05/08/2021)   Exercise Vital Sign    Days of Exercise per Week: 7 days    Minutes of Exercise per Session: 20 min  Stress: No Stress Concern Present (05/08/2021)   [place]    Feeling of Stress : Only a little  Social Connections: Socially Integrated (05/08/2021)   Social Connection and Isolation Panel [NHANES]    Frequency of Communication with Friends and Family: Not on file    Frequency of Social Gatherings with Friends and Family: More than three times a week    Attends Religious Services: 1 to 4 times per year    Active Member of [employer] or Organizations: Yes    Attends Archivist Meetings: 1 to 4 times per year    Marital Status: Married     Family History: The patient's family history includes  Breast cancer in her maternal aunt; Cancer in her brother and brother; Colon cancer in her brother; Hypothyroidism in her sister; Lung disease in her sister.  ROS:   Review of Systems  Constitutional: Negative.   HENT: Negative.    Eyes: Negative.   Respiratory: Negative.    Cardiovascular:  Positive for palpitations. Negative for chest pain, orthopnea, claudication, leg swelling and PND.  Gastrointestinal: Negative.   Genitourinary: Negative.   Musculoskeletal: Negative.   Skin: Negative.   Neurological: Negative.   Endo/Heme/Allergies: Negative.   Psychiatric/Behavioral: Negative.      Please see the history of present illness.    All other systems reviewed and are negative.  EKGs/Labs/Other Studies Reviewed:  The following studies were reviewed today:   EKG:  EKG is ordered today.  The ekg ordered today demonstrates normal sinus rhythm, 73 bpm, nonspecific ST/T wave abnormality, prolonged QT, otherwise nothing acute.   Cardiac monitor on December 18, 2020: Predominant rhythm is sinus with heart rate ranging from 50 bpm up to 112 bpm and average heart rate 76 bpm. There were rare PACs including couplets and triplets presenting less than 1% total beats. There were also rare PVCs representing less than 1% total beats. There were 9 brief episodes of SVT, the longest of which lasted 7 beats. There was also a run of NSVT versus aberrantly conducted SVT lasting 12 beats. No sustained arrhythmias or pauses.  Myoview on April 08, 2020: There was no ST segment deviation noted during stress. The study is normal. There are no perfusion defects consistent with prior infarct or current ischemia. This is a low risk study. The left ventricular ejection fraction is hyperdynamic (>65%).  Recent Labs: 04/26/2021: TSH 0.762  Recent Lipid Panel    Component Value Date/Time   CHOL 209 (H) 11/12/2016 1018   TRIG 166 (H) 11/12/2016 1018   HDL 66 11/12/2016 1018   CHOLHDL 3.2 11/12/2016  1018   LDLCALC 110 (H) 11/12/2016 1018    Physical Exam:    VS:  BP 120/80   Pulse 73   Ht 5' 4.5" (1.638 m)   Wt 183 lb 3.2 oz (83.1 kg)   SpO2 99%   BMI 30.96 kg/m     Wt Readings from Last 3 Encounters:  04/19/22 183 lb 3.2 oz (83.1 kg)  07/28/21 190 lb 3.2 oz (86.3 kg)  05/08/21 188 lb (85.3 kg)     GEN: Obese, 77 y.o. female in no acute distress HEENT: Normal NECK: No JVD; No carotid bruits CARDIAC: S1/S2, RRR, no murmurs, rubs, gallops; 2+ pulses RESPIRATORY:  Clear to auscultation without rales, wheezing or rhonchi  MUSCULOSKELETAL:  No edema; No deformity  SKIN: Warm and dry NEUROLOGIC:  Alert and oriented x 3 PSYCHIATRIC:  Normal affect   ASSESSMENT:    1. Palpitations   2. History of chest pain   3. Prolonged Q-T interval on ECG   4. Class 1 obesity due to excess calories in adult, unspecified BMI, unspecified whether serious comorbidity present    PLAN:    In order of problems listed above:  Palpitations Had an episode that lasted about 30 minutes yesterday while taking down Christmas decorations.  Episode was relieved by taking aspirin and husband's Ativan and sitting down. The ekg ordered today demonstrates normal sinus rhythm, 73 bpm, nonspecific ST/T wave abnormality, prolonged QT, otherwise nothing acute.  Previous cardiac monitor revealed rare PAC these, rare PVCs, 9 brief episodes of SVT, the longest of which lasted 7 beats, also a run of NSVT versus aberrantly conducted SVT lasting 12 beats, no sustained arrhythmias or pauses were noted.  Will initiate low-dose Toprol-XL, metoprolol succinate 12.5 mg daily.  Discussed importance of taking her own medication that is prescribed to her by her provider, and she verbalized understanding. Heart healthy diet and regular cardiovascular exercise encouraged. Recommended obtaining [employer] mobile app for monitoring.   History of chest pain Denied any chest pain with her above episode.  Initiating Toprol-XL as  mentioned above.  Continue current medication regimen. Heart healthy diet and regular cardiovascular exercise encouraged. ED precautions discussed.   3. Prolonged QT interval  EKG today shows prolonged QT, most likely d/t taking Wellbutrin and taking Ativan recently. See  above. Discussed to only take medications prescribed to her by her provider, and she verbalized understanding. Will continue to monitor. Has upcoming labs scheduled with her PCP for annual wellness check. Continue to follow with PCP.  4. Obesity BMI today 30.96.  Patient states she is working on her exercise regimen and losing weight.  Will refer her to PREP program in [place].     5. Disposition: Follow-up with Dr. Domenic Polite in 6 months or sooner if anything changes.   Medication Adjustments/Labs and Tests Ordered: Current medicines are reviewed at length with the patient today.  Concerns regarding medicines are outlined above.  Orders Placed This Encounter  Procedures   Amb Referral To Provider Referral Exercise Program (P.R.E.P)   EKG 12-Lead   Meds ordered this encounter  Medications   metoprolol succinate (TOPROL XL) 25 MG 24 hr tablet    Sig: Take 0.5 tablets (12.5 mg total) by mouth daily.    Dispense:  15 tablet    Refill:  6    New 04/19/2022    Patient Instructions  Medication Instructions:  Begin Torprol XL 12.'5mg'$  daily  Continue all other medications.     Labwork: none  Testing/Procedures: none  Follow-Up: 6 months - Dr. Domenic Polite   Any Other Special Instructions Will Be Listed Below (If Applicable). You have been referred to Prep program - [place]   If you need a refill on your cardiac medications before your next appointment, please call your pharmacy.    Signed, Finis Bud, NP  04/19/2022 2:14 PM    [place]

## 2022-05-09 ENCOUNTER — Other Ambulatory Visit: Payer: Self-pay

## 2022-05-09 ENCOUNTER — Ambulatory Visit (INDEPENDENT_AMBULATORY_CARE_PROVIDER_SITE_OTHER): Payer: Medicare PPO

## 2022-05-09 VITALS — Wt 173.0 lb

## 2022-05-09 DIAGNOSIS — Z0000 Encounter for general adult medical examination without abnormal findings: Secondary | ICD-10-CM | POA: Diagnosis not present

## 2022-05-09 DIAGNOSIS — E890 Postprocedural hypothyroidism: Secondary | ICD-10-CM

## 2022-05-09 MED ORDER — LEVOTHYROXINE SODIUM 100 MCG PO TABS: 100.0000 ug | ORAL_TABLET | Freq: Every day | ORAL | 3 refills | Status: DC

## 2022-05-09 NOTE — Telephone Encounter (Signed)
She needs rf on Synthroid. I made her a routine f/u appt for 05/30/22.  She would like labs prior to her appointment please if someone can please call her with an appt

## 2022-05-09 NOTE — Progress Notes (Signed)
Subjective:   Amber Rivas is a 77 y.o. female who presents for Medicare Annual (Subsequent) preventive examination.  I connected with  Amber Rivas on 05/09/22 by a audio enabled telemedicine application and verified that I am speaking with the correct person using two identifiers.  Patient Location: Home  Provider Location: Home Office  I discussed the limitations of evaluation and management by telemedicine. The patient expressed understanding and agreed to proceed.   Review of Systems     Cardiac Risk Factors include: advanced age (>80mn, >>45women)     Objective:    Today's Vitals   05/09/22 1200  Weight: 173 lb (78.5 kg)   Body mass index is 29.24 kg/m.     05/09/2022   12:11 PM 05/08/2021   12:08 PM 05/05/2020    4:16 PM 03/14/2020   11:02 AM 02/07/2017    9:35 AM 02/04/2017    1:10 PM 04/30/2016    7:50 PM  Advanced Directives  Does Patient Have a Medical Advance Directive? Yes Yes Yes Yes Yes Yes Yes  Type of AParamedicof [place]Living will [place] Section [place]Living will [place]Living will  HNorth [place]Living will [place]sideLiving will Living will  Does patient want to make changes to medical advance directive?   No - Patient declined  No - Patient declined    Copy of HNott[place]ayin Chart? No - copy requested No - copy requested No - copy requested        Current Medications (verified) Outpatient Encounter Medications as of 05/09/2022  Medication Sig   aspirin EC 81 MG tablet Take 81 mg by mouth daily as needed for mild pain or moderate pain.    B-COMPLEX-C PO Take 1 tablet by mouth daily.   doxycycline (VIBRAMYCIN) 100 MG capsule Take 100 mg by mouth 2 (two) times daily.   levothyroxine (SYNTHROID) 100 MCG tablet TAKE 1 TABLET BY MOUTH EVERY DAY   Multiple Vitamin (MULTIVITAMIN WITH MINERALS) TABS tablet Take 1 tablet by mouth daily.   Omega-3  Fatty Acids (OMEGA 3 PO) Take 1 capsule by mouth daily.    [DISCONTINUED] buPROPion (WELLBUTRIN SR) 150 MG 12 hr tablet Take 1 tablet (150 mg total) by mouth 2 (two) times daily.   [DISCONTINUED] metoprolol succinate (TOPROL XL) 25 MG 24 hr tablet Take 0.5 tablets (12.5 mg total) by mouth daily.   No facility-administered encounter medications on file as of 05/09/2022.    Allergies (verified) Elemental sulfur   History: Past Medical History:  Diagnosis Date   Arthritis    Cancer ([place]    Thyroid   History of hiatal hernia    History of pneumonia    Hyperthyroidism    Migraine    Past Surgical History:  Procedure Laterality Date   ANKLE SURGERY Right    BREAST EXCISIONAL BIOPSY Right    over 20 years ago   CATARACT EXTRACTION, BILATERAL     CHOLECYSTECTOMY  2005   COLONOSCOPY N/A 04/28/2015   Procedure: COLONOSCOPY;  Surgeon: NRogene Houston MD;  Location: [facility];  Service: Endoscopy;  Laterality: N/A;  730   MOUTH SURGERY     THYROIDECTOMY Left 02/07/2017   Procedure: TOTAL THYROIDECTOMY;  Surgeon: GMichael Boston MD;  Location: [facility];  Service: General;  Laterality: Left;  GENERAL AND LOCAL    Family History  Problem Relation Age of Onset   Lung disease Sister  Hypothyroidism Sister    Breast cancer Maternal Aunt    Colon cancer Brother    Cancer Brother    Cancer Brother    Early death Brother    Social History   Socioeconomic History   Marital status: Married    Spouse name: [name]   Number of children: 4   Years of education: Bachelors Degree   Highest education level: Bachelor's degree (e.g., BA, AB, BS)  Occupational History   Occupation: Retired    Fish farm manager: [employer]  Tobacco Use   Smoking status: Never   Smokeless tobacco: Never  Vaping Use   Vaping Use: Never used  Substance and Sexual Activity   Alcohol use: Not Currently    Comment: rare   Drug use: No   Sexual activity: Yes    Birth control/protection:  Post-menopausal  Other Topics Concern   Not on file  Social History Narrative   Son living with them right now while going through his divorce - 04/2021   Social Determinants of Health   Financial Resource Strain: Low Risk  (05/03/2022)   Overall Financial Resource Strain (CARDIA)    Difficulty of Paying Living Expenses: Not hard at all  Food Insecurity: No Food Insecurity (05/03/2022)   Hunger Vital Sign    Worried About Running Out of Food in the Last Year: Never true    Ran Out of Food in the Last Year: Never true  Transportation Needs: No Transportation Needs (05/03/2022)   PRAPARE - Hydrologist (Medical): No    Lack of Transportation (Non-Medical): No  Physical Activity: Unknown (05/03/2022)   Exercise Vital Sign    Days of Exercise per Week: 3 days    Minutes of Exercise per Session: Patient refused  Stress: No Stress Concern Present (05/03/2022)   [place]    Feeling of Stress : Only a little  Social Connections: Unknown (05/03/2022)   Social Connection and Isolation Panel [NHANES]    Frequency of Communication with Friends and Family: More than three times a week    Frequency of Social Gatherings with Friends and Family: Patient refused    Attends Religious Services: Patient refused    Marine scientist or Organizations: Patient refused    Attends Music therapist: Patient refused    Marital Status: Married    Tobacco Counseling Counseling given: Not Answered   Clinical Intake:  Pre-visit preparation completed: Yes  Pain : No/denies pain     BMI - recorded: 29.24 Nutritional Status: BMI 25 -29 Overweight Nutritional Risks: None Diabetes: No  How often do you need to have someone help you when you read instructions, pamphlets, or other written materials from your doctor or pharmacy?: 1 - Never  Diabetic? no  Interpreter Needed?: No  Information  entered by :: Curry Dulski, LPN   Activities of Daily Living    05/03/2022    7:44 AM  In your present state of health, do you have any difficulty performing the following activities:  Hearing? 0  Vision? 0  Difficulty concentrating or making decisions? 0  Walking or climbing stairs? 0  Dressing or bathing? 0  Doing errands, shopping? 0  Preparing Food and eating ? N  Using the Toilet? N  In the past six months, have you accidently leaked urine? N  Do you have problems with loss of bowel control? N  Managing your Medications? N  Managing your Finances? N  Housekeeping or managing your Housekeeping? N    Patient Care Team: [name], DO as PCP - General (Family Medicine) Satira Sark, MD as PCP - Cardiology (Cardiology)  Indicate any recent Medical Services you may have received from other than [facility] providers in the past year (date may be approximate).     Assessment:   This is a routine wellness examination for [place].  Hearing/Vision screen Hearing Screening - Comments:: Denies hearing difficulties   Vision Screening - Comments:: Wears reading glasses prn- up to date with routine eye exams with [facility]ana [facility]es and [facility]  Dietary issues and exercise activities discussed: Current Exercise Habits: Home exercise routine, Type of exercise: walking;stretching, Time (Minutes): 30, Frequency (Times/Week): 7, Weekly Exercise (Minutes/Week): 210, Intensity: Mild, Exercise limited by: None identified   Goals Addressed               This Visit's Progress     COMPLETED: Client said:  " I need to talk with someone about stress, anxiety related to family members' health issues" (pt-stated)        Current Barriers:  Mental Health Concerns  of client with chronic diagnoses of Depressive Disorder, Hypothyroidism, Vitamin D deficiency Family members health issues of concern  Clinical Social Work Clinical Goal(s):  Over the next 30 days, client will work with [name] to address  concerns related to stress concerning health needs of family members  Interventions: [name] provided counseling support for client [name] talked with client about relaxation techniques of choice for client Talked with client about health needs of family members  Patient Self Care Activities:  Self administers medications as prescribed Attends all scheduled provider appointments   Plan:  Client to call [name] as needed to discuss mental health needs of client Client to attend scheduled medical appointments Client to use relaxation techniques of choice to help manage client stress/anxiety symptoms [name] to call client in next 3 weeks to talk with client about stress management for client  Initial goal documentation      Patient Stated   On track     05/05/2020 AWV Goal: Exercise for General Health  Patient will verbalize understanding of the benefits of increased physical activity: Exercising regularly is important. It will improve your overall fitness, flexibility, and endurance. Regular exercise also will improve your overall health. It can help you control your weight, reduce stress, and improve your bone density. Over the next year, patient will increase physical activity as tolerated with a goal of at least 150 minutes of moderate physical activity per week.  You can tell that you are exercising at a moderate intensity if your heart starts beating faster and you start breathing faster but can still hold a conversation. Moderate-intensity exercise ideas include: Walking 1 mile (1.6 km) in about 15 minutes Biking Hiking Golfing Dancing Water aerobics Patient will verbalize understanding of everyday activities that increase physical activity by providing examples like the following: Yard work, such as: Sales promotion account executive Gardening Washing windows or floors Patient will be able to explain general safety  guidelines for exercising:  Before you start a new exercise program, talk with your health care provider. Do not exercise so much that you hurt yourself, feel dizzy, or get very short of breath. Wear comfortable clothes and wear shoes with good support. Drink plenty of water while you exercise to prevent dehydration or heat stroke. Work out until your breathing and your heartbeat  get faster.       Weight (lb) < 150 lb (68 kg)   173 lb (78.5 kg)     Hopes to get down to 145. On track       Depression Screen    05/09/2022   12:09 PM 07/28/2021    1:59 PM 05/08/2021   12:07 PM 04/26/2021    1:14 PM 11/15/2020    8:53 AM 10/24/2020    3:45 PM 05/05/2020    4:18 PM  PHQ 2/9 Scores  PHQ - 2 Score 0 1 0 0 0 2 1  PHQ- 9 Score  7   3 5     $ Fall Risk    05/09/2022   12:03 PM 05/03/2022    7:44 AM 07/28/2021    1:59 PM 05/08/2021   12:03 PM 04/26/2021    1:14 PM  Fall Risk   Falls in the past year? 0 0 0 0 0  Number falls in past yr: 0      Injury with Fall? 0   0   Risk for fall due to : No Fall Risks   Orthopedic patient   Follow up Falls prevention discussed   Falls prevention discussed     [place]:  Any stairs in or around the home? No  If so, are there any without handrails? No  Home free of loose throw rugs in walkways, pet beds, electrical cords, etc? Yes  Adequate lighting in your home to reduce risk of falls? Yes   ASSISTIVE DEVICES UTILIZED TO PREVENT FALLS:  Life alert? No  Use of a cane, walker or w/c? No  Grab bars in the bathroom? Yes  Shower chair or bench in shower? Yes  Elevated toilet seat or a handicapped toilet? No   TIMED UP AND GO:  Was the test performed? No . televisit  Cognitive Function:    12/10/2017   11:35 AM  MMSE - Mini Mental State Exam  Orientation to time 5  Orientation to Place 5  Registration 3  Attention/ Calculation 5  Recall 3  Language- name 2 objects 2  Language- repeat 1  Language- follow 3 step  command 3  Language- read & follow direction 1  Write a sentence 1  Copy design 1  Total score 30        05/05/2020    4:17 PM  6CIT Screen  What Year? 0 points  What month? 0 points  What time? 0 points  Count back from 20 0 points  Months in reverse 0 points  Repeat phrase 0 points  Total Score 0 points    Immunizations Immunization History  Administered Date(s) Administered   Fluad Quad(high Dose 65+) 04/26/2021   Influenza, High Dose Seasonal PF 02/10/2019   Influenza,inj,Quad PF,6+ Mos 01/14/2017   Influenza,inj,quad, With Preservative 02/12/2017   Moderna Sars-Covid-2 Vaccination 05/20/2019, 06/15/2019, 02/09/2020, 07/19/2020, 01/12/2021   Pneumococcal Conjugate-13 02/13/2019   Pneumococcal Polysaccharide-23 10/24/2020   Zoster Recombinat (Shingrix) 10/24/2020    TDAP status: Up to date  Flu Vaccine status: Due, Education has been provided regarding the importance of this vaccine. Advised may receive this vaccine at local pharmacy or Health Dept. Aware to provide a copy of the vaccination record if obtained from local pharmacy or Health Dept. Verbalized acceptance and understanding.  Pneumococcal vaccine status: Up to date  Covid-19 vaccine status: Completed vaccines  Qualifies for Shingles Vaccine? Yes   Zostavax completed No   Shingrix Completed?: No.  Education has been provided regarding the importance of this vaccine. Patient has been advised to call insurance company to determine out of pocket expense if they have not yet received this vaccine. Advised may also receive vaccine at local pharmacy or Health Dept. Verbalized acceptance and understanding.  Screening Tests Health Maintenance  Topic Date Due   Hepatitis C Screening  Never done   DTaP/Tdap/Td (1 - Tdap) Never done   Zoster Vaccines- Shingrix (2 of 2) 12/19/2020   INFLUENZA VACCINE  10/24/2021   COVID-19 Vaccine (6 - 2023-24 season) 11/24/2021   DEXA SCAN  12/21/2022   Medicare Annual  Wellness (AWV)  05/10/2023   COLONOSCOPY (Pts 45-32yr Insurance coverage will need to be confirmed)  02/01/2026   Pneumonia Vaccine 75 Years old  Completed   HPV VACCINES  Aged Out    Health Maintenance  Health Maintenance Due  Topic Date Due   Hepatitis C Screening  Never done   DTaP/Tdap/Td (1 - Tdap) Never done   Zoster Vaccines- Shingrix (2 of 2) 12/19/2020   INFLUENZA VACCINE  10/24/2021   COVID-19 Vaccine (6 - 2023-24 season) 11/24/2021    Colorectal cancer screening: Type of screening: Colonoscopy. Completed 04/28/2015. Repeat every 10 years  Mammogram status: Ordered 05/09/2022. Pt provided with contact info and advised to call to schedule appt.  Made mobile unit appt 07/2022  Bone Density status: Completed 12/20/2017. Results reflect: Bone density results: NORMAL. Repeat every 5 years.  Lung Cancer Screening: (Low Dose CT Chest recommended if Age 75-80years, 30 pack-year currently smoking OR have quit w/in 15years.) does not qualify.   Additional Screening:  Hepatitis C Screening: does qualify; DUE - ADD TO LABS  Vision Screening: Recommended annual ophthalmology exams for early detection of glaucoma and other disorders of the eye. Is the patient up to date with their annual eye exam?  Yes  Who is the provider or what is the name of the office in which the patient attends annual eye exams? [facility]If pt is not established with a provider, would they like to be referred to a provider to establish care? No .   Dental Screening: Recommended annual dental exams for proper oral hygiene  Community Resource Referral / Chronic Care Management: CRR required this visit?  No   CCM required this visit?  No      Plan:     I have personally reviewed and noted the following in the patient's chart:   Medical and social history Use of alcohol, tobacco or illicit drugs  Current medications and supplements including opioid prescriptions. Patient is not currently taking  opioid prescriptions. Functional ability and status Nutritional status Physical activity Advanced directives List of other physicians Hospitalizations, surgeries, and ER visits in previous 12 months Vitals Screenings to include cognitive, depression, and falls Referrals and appointments  In addition, I have reviewed and discussed with patient certain preventive protocols, quality metrics, and best practice recommendations. A written personalized care plan for preventive services as well as general preventive health recommendations were provided to patient.     ASandrea Hammond LPN   [id number]  Nurse Notes: stopped taking Bupropion and never started Metoprolol - doesn't feel like she needs either one. Scheduled for L blepharotomy in last February 2024 - [place]

## 2022-05-09 NOTE — Progress Notes (Signed)
Patient Medicare AWV questionnaire was completed by the patient on 05/03/2022; I have confirmed that all information answered by patient is correct and no changes since this date.   Adalberto Cole, LPN [id number] [facility] AWV Team Direct Dial: [phone]

## 2022-05-09 NOTE — Patient Instructions (Signed)
Amber Rivas , Thank you for taking time to come for your Medicare Wellness Visit. I appreciate your ongoing commitment to your health goals. Please review the following plan we discussed and let me know if I can assist you in the future.   These are the goals we discussed:  Goals      Patient Stated     05/05/2020 AWV Goal: Exercise for General Health  Patient will verbalize understanding of the benefits of increased physical activity: Exercising regularly is important. It will improve your overall fitness, flexibility, and endurance. Regular exercise also will improve your overall health. It can help you control your weight, reduce stress, and improve your bone density. Over the next year, patient will increase physical activity as tolerated with a goal of at least 150 minutes of moderate physical activity per week.  You can tell that you are exercising at a moderate intensity if your heart starts beating faster and you start breathing faster but can still hold a conversation. Moderate-intensity exercise ideas include: Walking 1 mile (1.6 km) in about 15 minutes Biking Hiking Golfing Dancing Water aerobics Patient will verbalize understanding of everyday activities that increase physical activity by providing examples like the following: Yard work, such as: Sales promotion account executive Gardening Washing windows or floors Patient will be able to explain general safety guidelines for exercising:  Before you start a new exercise program, talk with your health care provider. Do not exercise so much that you hurt yourself, feel dizzy, or get very short of breath. Wear comfortable clothes and wear shoes with good support. Drink plenty of water while you exercise to prevent dehydration or heat stroke. Work out until your breathing and your heartbeat get faster.      Weight (lb) < 150 lb (68 kg)     Hopes to get down to  145. On track        This is a list of the screening recommended for you and due dates:  Health Maintenance  Topic Date Due   Hepatitis C Screening: USPSTF Recommendation to screen - Ages 17-79 yo.  Never done   DTaP/Tdap/Td vaccine (1 - Tdap) Never done   Zoster (Shingles) Vaccine (2 of 2) 12/19/2020   Flu Shot  10/24/2021   COVID-19 Vaccine (6 - 2023-24 season) 11/24/2021   DEXA scan (bone density measurement)  12/21/2022   Medicare Annual Wellness Visit  05/10/2023   Colon Cancer Screening  02/01/2026   Pneumonia Vaccine  Completed   HPV Vaccine  Aged Out    Advanced directives: Please bring a copy of your health care power of attorney and living will to the office to be added to your chart at your convenience.   Conditions/risks identified: Keep up the great work staying active and losing weight.   Next appointment: Follow up in one year for your annual wellness visit    Preventive Care 65 Years and Older, Female Preventive care refers to lifestyle choices and visits with your health care provider that can promote health and wellness. What does preventive care include? A yearly physical exam. This is also called an annual well check. Dental exams once or twice a year. Routine eye exams. Ask your health care provider how often you should have your eyes checked. Personal lifestyle choices, including: Daily care of your teeth and gums. Regular physical activity. Eating a healthy diet. Avoiding tobacco and drug use. Limiting alcohol use. Practicing  safe sex. Taking low-dose aspirin every day. Taking vitamin and mineral supplements as recommended by your health care provider. What happens during an annual well check? The services and screenings done by your health care provider during your annual well check will depend on your age, overall health, lifestyle risk factors, and family history of disease. Counseling  Your health care provider may ask you questions about  your: Alcohol use. Tobacco use. Drug use. Emotional well-being. Home and relationship well-being. Sexual activity. Eating habits. History of falls. Memory and ability to understand (cognition). Work and work Statistician. Reproductive health. Screening  You may have the following tests or measurements: Height, weight, and BMI. Blood pressure. Lipid and cholesterol levels. These may be checked every 5 years, or more frequently if you are over 36 years old. Skin check. Lung cancer screening. You may have this screening every year starting at age 54 if you have a 30-pack-year history of smoking and currently smoke or have quit within the past 15 years. Fecal occult blood test (FOBT) of the stool. You may have this test every year starting at age 60. Flexible sigmoidoscopy or colonoscopy. You may have a sigmoidoscopy every 5 years or a colonoscopy every 10 years starting at age 37. Hepatitis C blood test. Hepatitis B blood test. Sexually transmitted disease (STD) testing. Diabetes screening. This is done by checking your blood sugar (glucose) after you have not eaten for a while (fasting). You may have this done every 1-3 years. Bone density scan. This is done to screen for osteoporosis. You may have this done starting at age 52. Mammogram. This may be done every 1-2 years. Talk to your health care provider about how often you should have regular mammograms. Talk with your health care provider about your test results, treatment options, and if necessary, the need for more tests. Vaccines  Your health care provider may recommend certain vaccines, such as: Influenza vaccine. This is recommended every year. Tetanus, diphtheria, and acellular pertussis (Tdap, Td) vaccine. You may need a Td booster every 10 years. Zoster vaccine. You may need this after age 61. Pneumococcal 13-valent conjugate (PCV13) vaccine. One dose is recommended after age 71. Pneumococcal polysaccharide (PPSV23) vaccine.  One dose is recommended after age 15. Talk to your health care provider about which screenings and vaccines you need and how often you need them. This information is not intended to replace advice given to you by your health care provider. Make sure you discuss any questions you have with your health care provider. Document Released: 04/08/2015 Document Revised: 11/30/2015 Document Reviewed: 01/11/2015 Elsevier Interactive Patient Education  2017 [place] Prevention in the Home Falls can cause injuries. They can happen to people of all ages. There are many things you can do to make your home safe and to help prevent falls. What can I do on the outside of my home? Regularly fix the edges of walkways and driveways and fix any cracks. Remove anything that might make you trip as you walk through a door, such as a raised step or threshold. Trim any bushes or trees on the path to your home. Use bright outdoor lighting. Clear any walking paths of anything that might make someone trip, such as rocks or tools. Regularly check to see if handrails are loose or broken. Make sure that both sides of any steps have handrails. Any raised decks and porches should have guardrails on the edges. Have any leaves, snow, or ice cleared regularly. Use sand or salt  on walking paths during winter. Clean up any spills in your garage right away. This includes oil or grease spills. What can I do in the bathroom? Use night lights. Install grab bars by the toilet and in the tub and shower. Do not use towel bars as grab bars. Use non-skid mats or decals in the tub or shower. If you need to sit down in the shower, use a plastic, non-slip stool. Keep the floor dry. Clean up any water that spills on the floor as soon as it happens. Remove soap buildup in the tub or shower regularly. Attach bath mats securely with double-sided non-slip rug tape. Do not have throw rugs and other things on the floor that can make  you trip. What can I do in the bedroom? Use night lights. Make sure that you have a light by your bed that is easy to reach. Do not use any sheets or blankets that are too big for your bed. They should not hang down onto the floor. Have a firm chair that has side arms. You can use this for support while you get dressed. Do not have throw rugs and other things on the floor that can make you trip. What can I do in the kitchen? Clean up any spills right away. Avoid walking on wet floors. Keep items that you use a lot in easy-to-reach places. If you need to reach something above you, use a strong step stool that has a grab bar. Keep electrical cords out of the way. Do not use floor polish or wax that makes floors slippery. If you must use wax, use non-skid floor wax. Do not have throw rugs and other things on the floor that can make you trip. What can I do with my stairs? Do not leave any items on the stairs. Make sure that there are handrails on both sides of the stairs and use them. Fix handrails that are broken or loose. Make sure that handrails are as long as the stairways. Check any carpeting to make sure that it is firmly attached to the stairs. Fix any carpet that is loose or worn. Avoid having throw rugs at the top or bottom of the stairs. If you do have throw rugs, attach them to the floor with carpet tape. Make sure that you have a light switch at the top of the stairs and the bottom of the stairs. If you do not have them, ask someone to add them for you. What else can I do to help prevent falls? Wear shoes that: Do not have high heels. Have rubber bottoms. Are comfortable and fit you well. Are closed at the toe. Do not wear sandals. If you use a stepladder: Make sure that it is fully opened. Do not climb a closed stepladder. Make sure that both sides of the stepladder are locked into place. Ask someone to hold it for you, if possible. Clearly mark and make sure that you can  see: Any grab bars or handrails. First and last steps. Where the edge of each step is. Use tools that help you move around (mobility aids) if they are needed. These include: Canes. Walkers. Scooters. Crutches. Turn on the lights when you go into a dark area. Replace any light bulbs as soon as they burn out. Set up your furniture so you have a clear path. Avoid moving your furniture around. If any of your floors are uneven, fix them. If there are any pets around you, be aware of where  they are. Review your medicines with your doctor. Some medicines can make you feel dizzy. This can increase your chance of falling. Ask your doctor what other things that you can do to help prevent falls. This information is not intended to replace advice given to you by your health care provider. Make sure you discuss any questions you have with your health care provider. Document Released: 01/06/2009 Document Revised: 08/18/2015 Document Reviewed: 04/16/2014 Elsevier Interactive Patient Education  2017 [employer].

## 2022-05-22 DIAGNOSIS — H57813 Brow ptosis, bilateral: Secondary | ICD-10-CM | POA: Diagnosis not present

## 2022-05-22 DIAGNOSIS — H02413 Mechanical ptosis of bilateral eyelids: Secondary | ICD-10-CM | POA: Diagnosis not present

## 2022-05-22 DIAGNOSIS — H0279 Other degenerative disorders of eyelid and periocular area: Secondary | ICD-10-CM | POA: Diagnosis not present

## 2022-05-22 DIAGNOSIS — H02831 Dermatochalasis of right upper eyelid: Secondary | ICD-10-CM | POA: Diagnosis not present

## 2022-05-22 DIAGNOSIS — H02834 Dermatochalasis of left upper eyelid: Secondary | ICD-10-CM | POA: Diagnosis not present

## 2022-05-22 DIAGNOSIS — H5340 Unspecified visual field defects: Secondary | ICD-10-CM | POA: Diagnosis not present

## 2022-05-30 ENCOUNTER — Ambulatory Visit: Payer: Medicare PPO | Admitting: Family Medicine

## 2022-05-30 ENCOUNTER — Encounter: Payer: Self-pay | Admitting: Family Medicine

## 2022-05-30 VITALS — BP 140/81 | HR 77 | Temp 98.6°F | Ht 64.0 in | Wt 170.0 lb

## 2022-05-30 DIAGNOSIS — G43709 Chronic migraine without aura, not intractable, without status migrainosus: Secondary | ICD-10-CM

## 2022-05-30 DIAGNOSIS — F339 Major depressive disorder, recurrent, unspecified: Secondary | ICD-10-CM | POA: Diagnosis not present

## 2022-05-30 DIAGNOSIS — E890 Postprocedural hypothyroidism: Secondary | ICD-10-CM | POA: Diagnosis not present

## 2022-05-30 NOTE — [facility] Notes (Signed)
Subjective: CC: Hypothyroidism PCP: Amber Norlander, DO ZD:[id number] Amber Rivas is a 77 y.o. female presenting to clinic today for:  1.  Hypothyroidism Patient reports compliance with Synthroid 100 mcg daily.  No reports of changes in bowel habits, tremors or heart palpitations.  She has had some weight loss but is actively working with the weight loss clinic in [place] to achieve this.  Overall she is feeling better, more energetic most times and is seeing success with her dietary changes.  Has not resumed back exercising because she just had a lid lift done.  Her vision has gotten much better after this lid lift.  Follow-up with her specialist will be next week  [name], she recently had a tooth done and feels like her headaches have gotten better after that tooth removal.  She is on antibiotics for presumed sinus infection for the next several months as there is a possible canal connecting her oral cavity with her sinus on the left-hand side. No fevers, facial swelling, fevers ROS: Per HPI  Allergies  Allergen Reactions   Elemental Sulfur Anaphylaxis   Past Medical History:  Diagnosis Date   Arthritis    Cancer ([place])    Thyroid   History of hiatal hernia    History of pneumonia    Hyperthyroidism    Migraine     Current Outpatient Medications:    aspirin EC 81 MG tablet, Take 81 mg by mouth daily as needed for mild pain or moderate pain. , Disp: , Rfl:    B-COMPLEX-C PO, Take 1 tablet by mouth daily., Disp: , Rfl:    doxycycline (VIBRAMYCIN) 100 MG capsule, Take 100 mg by mouth 2 (two) times daily., Disp: , Rfl:    levothyroxine (SYNTHROID) 100 MCG tablet, Take 1 tablet (100 mcg total) by mouth daily., Disp: 90 tablet, Rfl: 3   Multiple Vitamin (MULTIVITAMIN WITH MINERALS) TABS tablet, Take 1 tablet by mouth daily., Disp: , Rfl:    Omega-3 Fatty Acids (OMEGA 3 PO), Take 1 capsule by mouth daily. , Disp: , Rfl:  Social History   Socioeconomic History   Marital status:  Married    Spouse name: [name]   Number of children: 4   Years of education: Bachelors Degree   Highest education level: Bachelor's degree (e.g., BA, AB, BS)  Occupational History   Occupation: Retired    Fish farm manager: [employer]  Tobacco Use   Smoking status: Never   Smokeless tobacco: Never  Vaping Use   Vaping Use: Never used  Substance and Sexual Activity   Alcohol use: Not Currently    Comment: rare   Drug use: No   Sexual activity: Yes    Birth control/protection: Post-menopausal  Other Topics Concern   Not on file  Social History Narrative   Son living with them right now while going through his divorce - 04/2021   Social Determinants of Health   Financial Resource Strain: Low Risk  (05/03/2022)   Overall Financial Resource Strain (CARDIA)    Difficulty of Paying Living Expenses: Not hard at all  Food Insecurity: No Food Insecurity (05/03/2022)   Hunger Vital Sign    Worried About Running Out of Food in the Last Year: Never true    Ran Out of Food in the Last Year: Never true  Transportation Needs: No Transportation Needs (05/03/2022)   PRAPARE - Hydrologist (Medical): No    Lack of Transportation (Non-Medical): No  Physical Activity: Unknown (05/03/2022)  Exercise Vital Sign    Days of Exercise per Week: 3 days    Minutes of Exercise per Session: Patient refused  Stress: No Stress Concern Present (05/03/2022)   [place]    Feeling of Stress : Only a little  Social Connections: Unknown (05/03/2022)   Social Connection and Isolation Panel [NHANES]    Frequency of Communication with Friends and Family: More than three times a week    Frequency of Social Gatherings with Friends and Family: Patient refused    Attends Religious Services: Patient refused    Active Member of Clubs or Organizations: Patient refused    Attends Archivist Meetings: Patient refused     Marital Status: Married  Human resources officer Violence: Not At Risk (05/09/2022)   Humiliation, Afraid, Rape, and Kick questionnaire    Fear of Current or Ex-Partner: No    Emotionally Abused: No    Physically Abused: No    Sexually Abused: No   Family History  Problem Relation Age of Onset   Lung disease Sister    Hypothyroidism Sister    Breast cancer Maternal Aunt    Colon cancer Brother    Cancer Brother    Cancer Brother    Early death Brother     Objective: Office vital signs reviewed. BP (!) 140/81   Pulse 77   Temp 98.6 F (37 C)   Ht '5\' 4"'$  (1.626 m)   Wt 170 lb (77.1 kg)   SpO2 97%   BMI 29.18 kg/m   Physical Examination:  General: Awake, alert, well nourished, No acute distress HEENT: Well-appearing postsurgical size at both the upper brow and superior eyelid.  No evidence of secondary infection.  Stitches are intact.  No wound dehiscence appreciated Cardio: regular rate and rhythm, S1S2 heard, no murmurs appreciated Pulm: clear to auscultation bilaterally, no wheezes, rhonchi or rales; normal work of breathing on room air Neuro: No tremor.  Assessment/ Plan: 77 y.o. female   Postoperative hypothyroidism - Plan: TSH, T4, Free, CMP14+EGFR, LDL Cholesterol, Direct  Chronic migraine without aura without status migrainosus, not intractable - Plan: CMP14+EGFR, CBC  Asymptomatic from a thyroid standpoint.  Check thyroid levels, CMP, direct LDL given nonfasting status  Headaches have improved after she had her tooth pulled and is now on antibiotics for suspected sinus infection.  Orders Placed This Encounter  Procedures   TSH   T4, Free   CMP14+EGFR   LDL Cholesterol, Direct   CBC   No orders of the defined types were placed in this encounter.    Amber Norlander, DO [place] [phone]

## 2022-05-31 LAB — CBC
Hematocrit: 41.4 % (ref 34.0–46.6)
Hemoglobin: 13.9 g/dL (ref 11.1–15.9)
MCH: 28.7 pg (ref 26.6–33.0)
MCHC: 33.6 g/dL (ref 31.5–35.7)
MCV: 86 fL (ref 79–97)
Platelets: 302 10*3/uL (ref 150–450)
RBC: 4.84 x10E6/uL (ref 3.77–5.28)
RDW: 14.1 % (ref 11.7–15.4)
WBC: 7 10*3/uL (ref 3.4–10.8)

## 2022-05-31 LAB — CMP14+EGFR
ALT: 19 IU/L (ref 0–32)
AST: 19 IU/L (ref 0–40)
Albumin/Globulin Ratio: 1.7 (ref 1.2–2.2)
Albumin: 4.3 g/dL (ref 3.8–4.8)
Alkaline Phosphatase: 85 IU/L (ref 44–121)
BUN/Creatinine Ratio: 19 (ref 12–28)
BUN: 13 mg/dL (ref 8–27)
Bilirubin Total: 0.2 mg/dL (ref 0.0–1.2)
CO2: 22 mmol/L (ref 20–29)
Calcium: 9.4 mg/dL (ref 8.7–10.3)
Chloride: 100 mmol/L (ref 96–106)
Creatinine, Ser: 0.67 mg/dL (ref 0.57–1.00)
Globulin, Total: 2.5 g/dL (ref 1.5–4.5)
Glucose: 94 mg/dL (ref 70–99)
Potassium: 4.1 mmol/L (ref 3.5–5.2)
Sodium: 139 mmol/L (ref 134–144)
Total Protein: 6.8 g/dL (ref 6.0–8.5)
eGFR: 91 mL/min/{1.73_m2} (ref >59)

## 2022-05-31 LAB — TSH: TSH: 0.231 u[IU]/mL — ABNORMAL LOW (ref 0.450–4.500)

## 2022-05-31 LAB — T4, FREE: Free T4: 1.87 ng/dL — ABNORMAL HIGH (ref 0.82–1.77)

## 2022-05-31 LAB — LDL CHOLESTEROL, DIRECT: LDL Direct: 116 mg/dL — ABNORMAL HIGH (ref 0–99)

## 2022-07-24 ENCOUNTER — Other Ambulatory Visit: Payer: Self-pay | Admitting: Family Medicine

## 2022-07-24 DIAGNOSIS — Z1231 Encounter for screening mammogram for malignant neoplasm of breast: Secondary | ICD-10-CM

## 2022-07-30 ENCOUNTER — Telehealth: Payer: Self-pay | Admitting: Family Medicine

## 2022-07-30 NOTE — Telephone Encounter (Signed)
Appt made/ pt aware  

## 2022-08-06 ENCOUNTER — Ambulatory Visit
Admission: RE | Admit: 2022-08-06 | Discharge: 2022-08-06 | Disposition: A | Discharge status: Home / Self Care | Payer: Medicare PPO | Source: Ambulatory Visit | Attending: Family Medicine | Admitting: Family Medicine

## 2022-08-06 ENCOUNTER — Ambulatory Visit (INDEPENDENT_AMBULATORY_CARE_PROVIDER_SITE_OTHER): Payer: Medicare PPO

## 2022-08-06 ENCOUNTER — Other Ambulatory Visit: Payer: Self-pay | Admitting: Family Medicine

## 2022-08-06 DIAGNOSIS — Z780 Asymptomatic menopausal state: Secondary | ICD-10-CM | POA: Diagnosis not present

## 2022-08-06 DIAGNOSIS — Z1231 Encounter for screening mammogram for malignant neoplasm of breast: Secondary | ICD-10-CM

## 2022-08-07 DIAGNOSIS — Z780 Asymptomatic menopausal state: Secondary | ICD-10-CM | POA: Diagnosis not present

## 2022-09-13 DIAGNOSIS — H02831 Dermatochalasis of right upper eyelid: Secondary | ICD-10-CM | POA: Diagnosis not present

## 2022-09-13 DIAGNOSIS — H0279 Other degenerative disorders of eyelid and periocular area: Secondary | ICD-10-CM | POA: Diagnosis not present

## 2022-09-13 DIAGNOSIS — H57813 Brow ptosis, bilateral: Secondary | ICD-10-CM | POA: Diagnosis not present

## 2022-09-13 DIAGNOSIS — H02832 Dermatochalasis of right lower eyelid: Secondary | ICD-10-CM | POA: Diagnosis not present

## 2022-09-13 DIAGNOSIS — H02835 Dermatochalasis of left lower eyelid: Secondary | ICD-10-CM | POA: Diagnosis not present

## 2022-09-13 DIAGNOSIS — H02834 Dermatochalasis of left upper eyelid: Secondary | ICD-10-CM | POA: Diagnosis not present

## 2022-10-16 ENCOUNTER — Ambulatory Visit: Payer: Medicare PPO | Admitting: Cardiology

## 2022-10-16 NOTE — [facility] Notes (Deleted)
    Cardiology Office Note  Date: 10/16/2022   ID: Amber Rivas 29-May-1945, MRN [id number]  History of Present Illness: Amber Rivas is a 77 y.o. female last seen in January by Ms. Philis Nettle NP, I reviewed the note.  Physical Exam: VS:  There were no vitals taken for this visit., BMI There is no height or weight on file to calculate BMI.  Wt Readings from Last 3 Encounters:  05/30/22 170 lb (77.1 kg)  05/09/22 173 lb (78.5 kg)  04/19/22 183 lb 3.2 oz (83.1 kg)    General: Patient appears comfortable at rest. HEENT: Conjunctiva and lids normal, oropharynx clear with moist mucosa. Neck: Supple, no elevated JVP or carotid bruits, no thyromegaly. Lungs: Clear to auscultation, nonlabored breathing at rest. Cardiac: Regular rate and rhythm, no S3 or significant systolic murmur, no pericardial rub. Abdomen: Soft, nontender, no hepatomegaly, bowel sounds present, no guarding or rebound. Extremities: No pitting edema, distal pulses 2+. Skin: Warm and dry. Musculoskeletal: No kyphosis. Neuropsychiatric: Alert and oriented x3, affect grossly appropriate.  ECG:  An ECG dated 04/19/2022 was personally reviewed today and demonstrated:  Sinus rhythm with nonspecific ST-T changes, borderline prolonged QT interval.  Labwork: 05/30/2022: ALT 19; AST 19; BUN 13; Creatinine, Ser 0.67; Hemoglobin 13.9; Platelets 302; Potassium 4.1; Sodium 139; TSH 0.231     Component Value Date/Time   CHOL 209 (H) 11/12/2016 1018   TRIG 166 (H) 11/12/2016 1018   HDL 66 11/12/2016 1018   CHOLHDL 3.2 11/12/2016 1018   LDLCALC 110 (H) 11/12/2016 1018   LDLDIRECT 116 (H) 05/30/2022 1652   Other Studies Reviewed Today:  No interval cardiac testing for review today.  Assessment and Plan:  1.  History of chest pain with reassuring ischemic workup in 2022.  Lexiscan Myoview at that time was low risk without ischemia and LVEF greater than 65%.  2.  History of syncope, likely neurocardiogenic based on prior  description.  Cardiac monitor in 2022 showed rare PACs and PVCs with brief episodes of SVT and aberrant SVT noted, no sustained arrhythmias or pauses.  3.  History of palpitations.  She was started on low-dose Toprol-XL at last visit back in January.  Disposition:  Follow up {follow up:15908}  Signed, Jonelle Sidle, M.D., F.A.C.C.  [facility]

## 2022-11-11 IMAGING — MG MM DIGITAL SCREENING BILAT W/ TOMO AND CAD
8 series · 8 of 24 positions shown · non-contrast
Comparison: Previous exam(s).

CLINICAL DATA: Screening.

EXAM:
DIGITAL SCREENING BILATERAL MAMMOGRAM WITH TOMOSYNTHESIS AND CAD
TECHNIQUE: Bilateral screening digital craniocaudal and mediolateral oblique
mammograms were obtained. Bilateral screening digital breast
tomosynthesis was performed. The images were evaluated with
computer-aided detection.

[R MLO synth-2D]
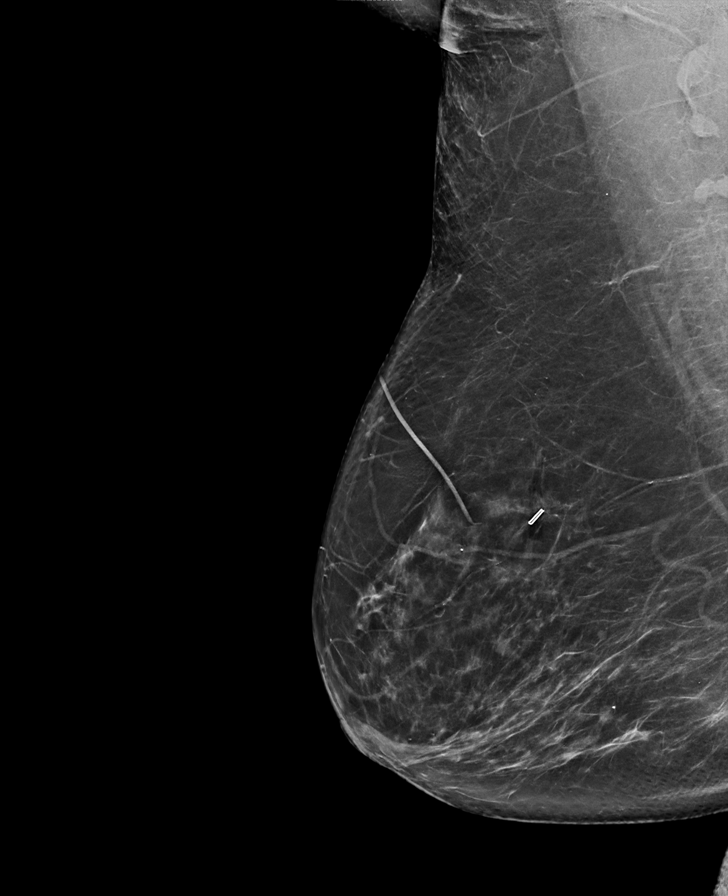

[L CC synth-2D]
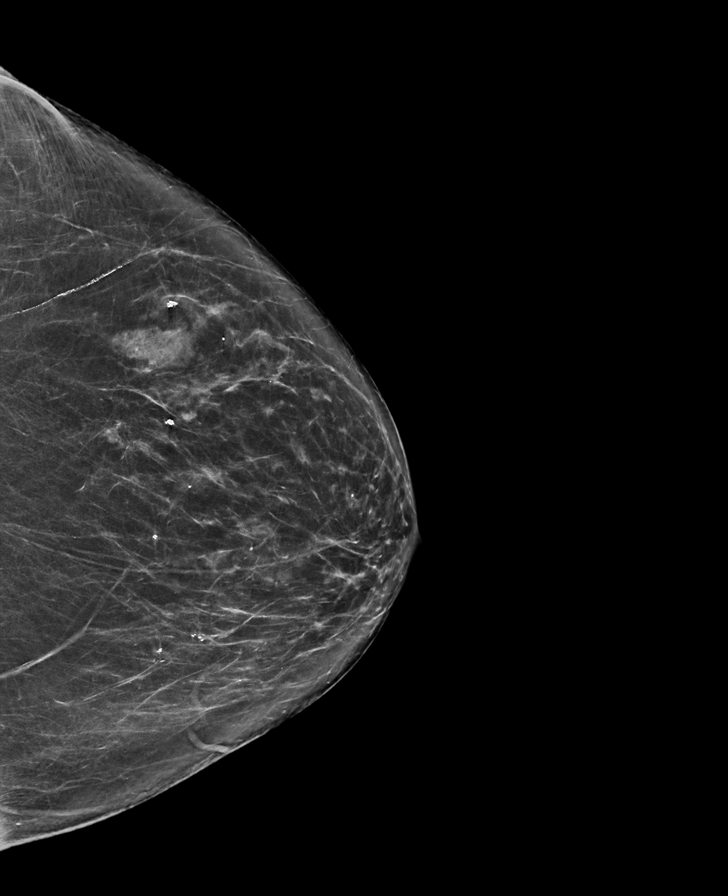

[L MLO synth-2D]
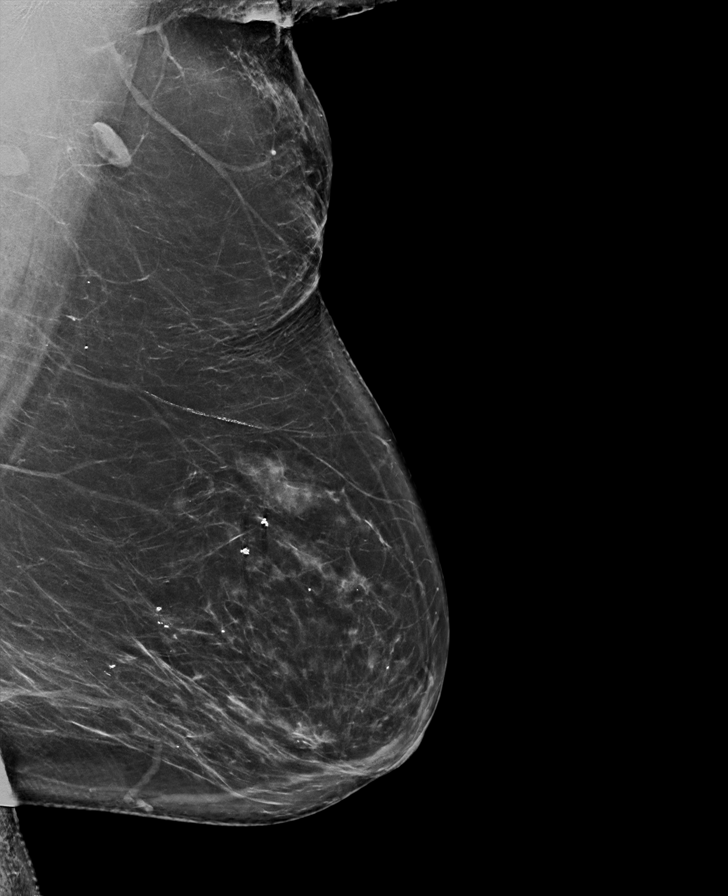

[R CC synth-2D]
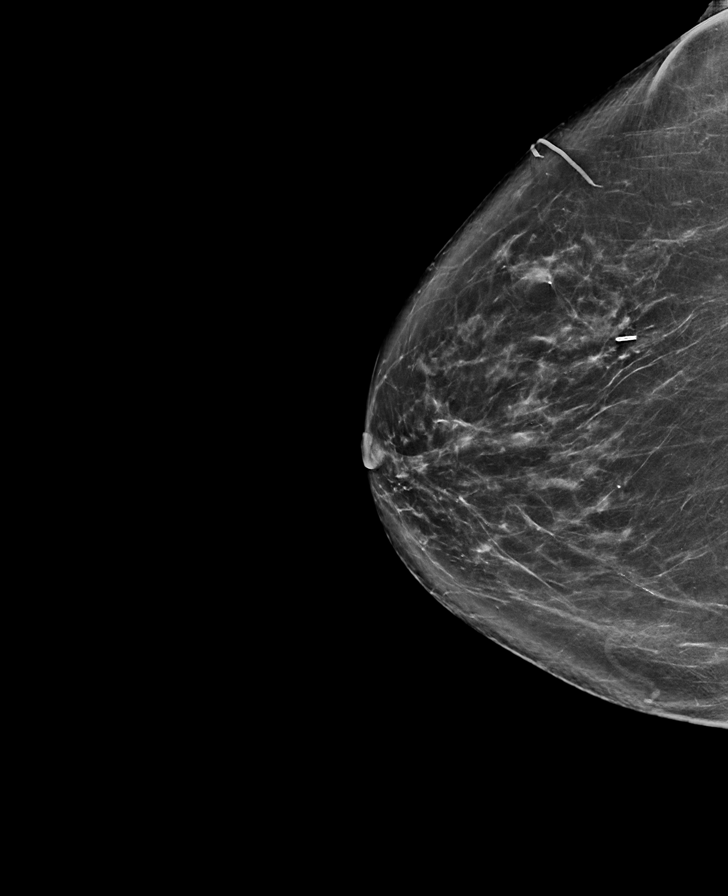

[R MLO tomo · tomo slice 41/80.0]
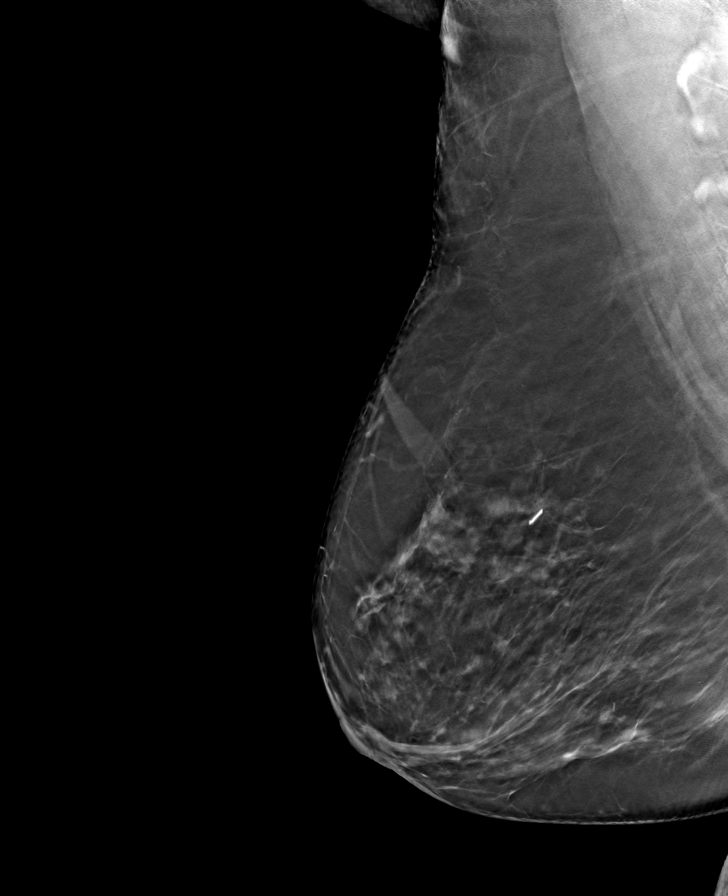

[L MLO tomo · tomo slice 43/84.0]
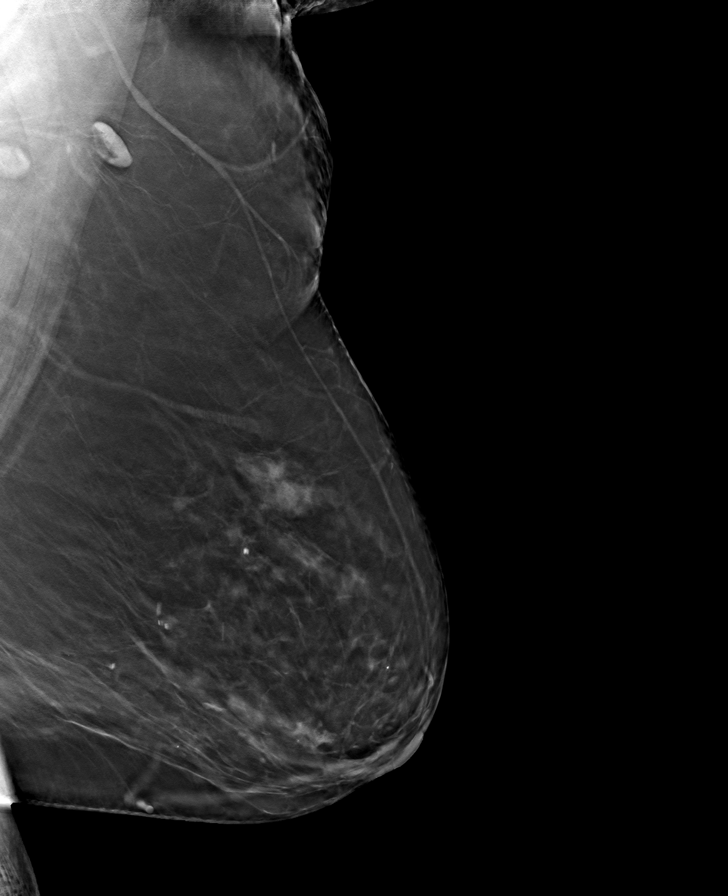

[L CC tomo · tomo slice 37/73.0]
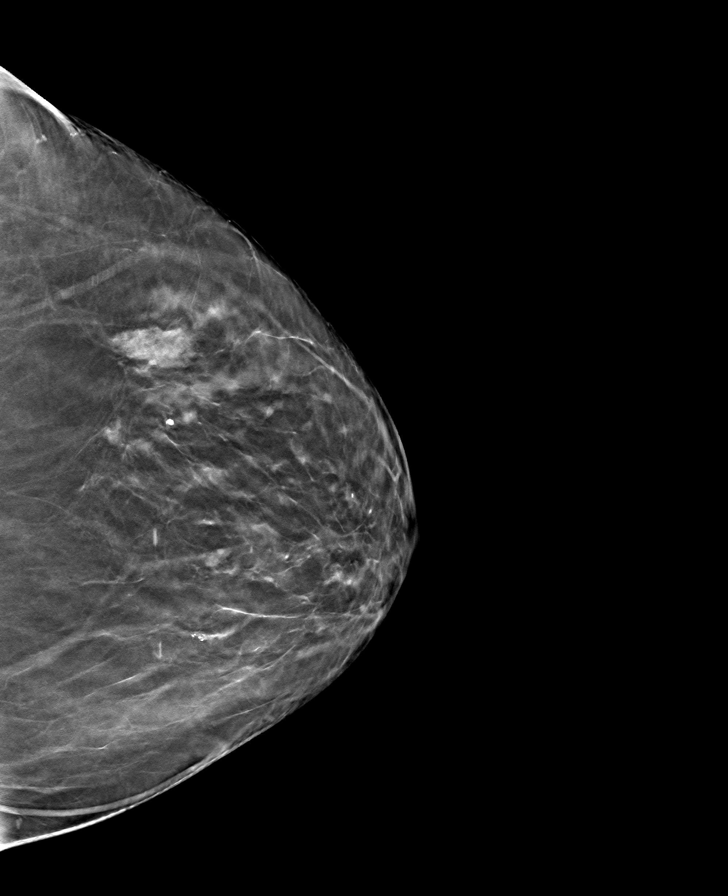

[R CC tomo · tomo slice 40/79.0]
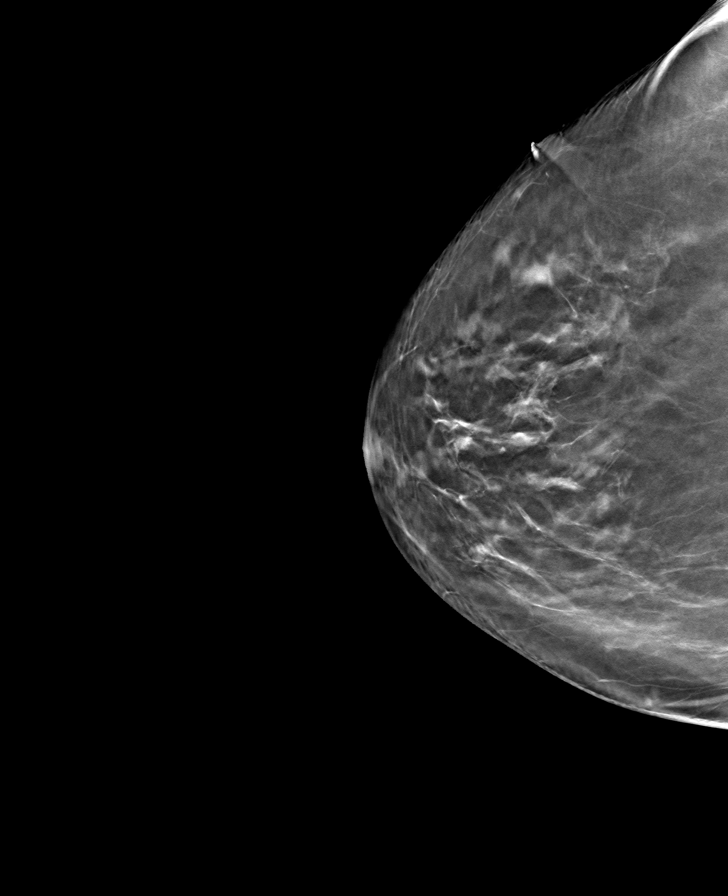

[8 of 24 positions shown; findings below may reference images not displayed]

ACR Breast Density Category b: There are scattered areas of
fibroglandular density.
FINDINGS: There are no findings suspicious for malignancy.
IMPRESSION: No mammographic evidence of malignancy. A result letter of this
screening mammogram will be mailed directly to the patient.

RECOMMENDATION:
Screening mammogram in one year. (Code:51-O-LD2)

BI-RADS CATEGORY  1: Negative.

## 2022-11-21 DIAGNOSIS — H35372 Puckering of macula, left eye: Secondary | ICD-10-CM | POA: Diagnosis not present

## 2022-12-03 ENCOUNTER — Ambulatory Visit: Payer: Medicare PPO | Admitting: Family Medicine

## 2022-12-03 ENCOUNTER — Encounter: Payer: Self-pay | Admitting: Family Medicine

## 2022-12-03 VITALS — BP 123/87 | HR 74 | Temp 98.4°F | Ht 64.0 in | Wt 151.0 lb

## 2022-12-03 DIAGNOSIS — L989 Disorder of the skin and subcutaneous tissue, unspecified: Secondary | ICD-10-CM | POA: Diagnosis not present

## 2022-12-03 DIAGNOSIS — L570 Actinic keratosis: Secondary | ICD-10-CM | POA: Diagnosis not present

## 2022-12-03 DIAGNOSIS — J329 Chronic sinusitis, unspecified: Secondary | ICD-10-CM | POA: Diagnosis not present

## 2022-12-03 MED ORDER — FEXOFENADINE HCL 180 MG PO TABS
180.0000 mg | ORAL_TABLET | Freq: Every day | ORAL | Status: DC
Start: 2022-12-03 — End: 2023-04-10

## 2022-12-03 MED ORDER — AMOXICILLIN-POT CLAVULANATE 875-125 MG PO TABS
1.0000 | ORAL_TABLET | Freq: Two times a day (BID) | ORAL | 0 refills | Status: AC
Start: 2022-12-03 — End: ?

## 2022-12-03 MED ORDER — TRIAMCINOLONE ACETONIDE 55 MCG/ACT NA AERO
2.0000 | INHALATION_SPRAY | Freq: Every day | NASAL | Status: AC
Start: 2022-12-03 — End: ?

## 2022-12-03 NOTE — Progress Notes (Signed)
Subjective: CC: Sinusitis PCP: Raliegh Ip, DO ZOX:WRUEAV W Vannice is a 77 y.o. female presenting to clinic today for:  1.  Sinusitis Patient thinks that she likely has a sinusitis that needs to be treated with antibiotics.  She reports a left-sided facial discomfort along the maxillary sinus.  This was initially thought to be secondary to a tunnel formation between her tooth which was surgically removed and her maxillary sinus.  She was empirically treated with a long course of doxycycline but when she saw her dentist in [place] they did not feel that that tunnel was present.  She notes that symptoms seem to subside for a while but after she came off the doxycycline the symptoms started recurring again.  She reports no purulence from nares, fevers or facial swelling.  She is utilizing Flonase for nasal sprays but is not on any oral antihistamines.  Has an ENT, Dr. Halina Andreas whom she plans on calling for an appointment soon.  2.  Skin check She would also like to have referral to dermatology for full-body skin exam.  She has got a couple of pigmented skin lesions that she would like looked at as well as some crusty spots on her scalp   ROS: Per HPI  Allergies  Allergen Reactions   Elemental Sulfur Anaphylaxis   Past Medical History:  Diagnosis Date   Arthritis    Cancer (HCC)    Thyroid   History of hiatal hernia    History of pneumonia    Hyperthyroidism    Migraine     Current Outpatient Medications:    aspirin EC 81 MG tablet, Take 81 mg by mouth daily as needed for mild pain or moderate pain. , Disp: , Rfl:    B-COMPLEX-C PO, Take 1 tablet by mouth daily., Disp: , Rfl:    levothyroxine (SYNTHROID) 100 MCG tablet, Take 1 tablet (100 mcg total) by mouth daily., Disp: 90 tablet, Rfl: 3   Multiple Vitamin (MULTIVITAMIN WITH MINERALS) TABS tablet, Take 1 tablet by mouth daily., Disp: , Rfl:    Omega-3 Fatty Acids (OMEGA 3 PO), Take 1 capsule by mouth daily. , Disp: , Rfl:     amoxicillin-clavulanate (AUGMENTIN) 875-125 MG tablet, Take 1 tablet by mouth 2 (two) times daily., Disp: 20 tablet, Rfl: 0   fexofenadine (ALLEGRA ALLERGY) 180 MG tablet, Take 1 tablet (180 mg total) by mouth daily., Disp: , Rfl:    triamcinolone (NASACORT) 55 MCG/ACT AERO nasal inhaler, Place 2 sprays into the nose daily., Disp: , Rfl:  Social History   Socioeconomic History   Marital status: Married    Spouse name: Leonette Most   Number of children: 4   Years of education: Bachelors Degree   Highest education level: Bachelor's degree (e.g., BA, AB, BS)  Occupational History   Occupation: Retired    Associate Professor: [employer]  Tobacco Use   Smoking status: Never   Smokeless tobacco: Never  Vaping Use   Vaping status: Never Used  Substance and Sexual Activity   Alcohol use: Not Currently    Comment: rare   Drug use: No   Sexual activity: Yes    Birth control/protection: Post-menopausal  Other Topics Concern   Not on file  Social History Narrative   Son living with them right now while going through his divorce - 04/2021   Social Determinants of Health   Financial Resource Strain: Low Risk  (05/03/2022)   Overall Financial Resource Strain (CARDIA)    Difficulty of Paying  Living Expenses: Not hard at all  Food Insecurity: No Food Insecurity (05/03/2022)   Hunger Vital Sign    Worried About Running Out of Food in the Last Year: Never true    Ran Out of Food in the Last Year: Never true  Transportation Needs: No Transportation Needs (05/03/2022)   PRAPARE - Administrator, Civil Service (Medical): No    Lack of Transportation (Non-Medical): No  Physical Activity: Unknown (05/03/2022)   Exercise Vital Sign    Days of Exercise per Week: 3 days    Minutes of Exercise per Session: Patient declined  Stress: No Stress Concern Present (05/03/2022)   [employer] of Occupational Health - Occupational Stress Questionnaire    Feeling of Stress : Only a little  Social  Connections: Unknown (05/03/2022)   Social Connection and Isolation Panel [NHANES]    Frequency of Communication with Friends and Family: More than three times a week    Frequency of Social Gatherings with Friends and Family: Patient declined    Attends Religious Services: Patient declined    Database administrator or Organizations: Patient declined    Attends Banker Meetings: Patient declined    Marital Status: Married  Catering manager Violence: Not At Risk (05/09/2022)   Humiliation, Afraid, Rape, and Kick questionnaire    Fear of Current or Ex-Partner: No    Emotionally Abused: No    Physically Abused: No    Sexually Abused: No   Family History  Problem Relation Age of Onset   Lung disease Sister    Hypothyroidism Sister    Breast cancer Maternal Aunt    Colon cancer Brother    Cancer Brother    Cancer Brother    Early death Brother     Objective: Office vital signs reviewed. BP 123/87   Pulse 74   Temp 98.4 F (36.9 C)   Ht 5\' 4"  (1.626 m)   Wt 151 lb (68.5 kg)   SpO2 97%   BMI 25.92 kg/m   Physical Examination:  General: Awake, alert, well-appearing female in no acute distress HEENT: Sclera white.  Moist mucous membranes.  TMs intact bilaterally.  Oropharyngeal cobblestone appearance appreciated.  No ulcerations or exudates.  Small hemostatic bleed noted along the nasal septum on the right.  Otherwise turbinates are edematous and erythematous without any purulent discharge appreciated Cardio: regular rate and rhythm, S1S2 heard, no murmurs appreciated Pulm: clear to auscultation bilaterally, no wheezes, rhonchi or rales; normal work of breathing on room air Skin: Several pigmented skin lesions of the lower extremity appreciated  Assessment/ Plan: 77 y.o. female   Rhinosinusitis - Plan: triamcinolone (NASACORT) 55 MCG/ACT AERO nasal inhaler, fexofenadine (ALLEGRA ALLERGY) 180 MG tablet, amoxicillin-clavulanate (AUGMENTIN) 875-125 MG tablet  Skin  lesion - Plan: Ambulatory referral to Dermatology  Actinic keratoses - Plan: Ambulatory referral to Dermatology  Trial of Nasacort to replace Flonase, Allegra.  Augmentin given to empirically treat for any sinus infection that may be present as she had no tenderness to palpation over the maxillary sinus on the left today.  She will see ENT soon and I have asked that she have them fax over any records  Referral to dermatology for full-body skin exam and addressing of any skin lesions of concern.   Raliegh Ip, DO [facility] [phone]

## 2022-12-04 DIAGNOSIS — L03031 Cellulitis of right toe: Secondary | ICD-10-CM | POA: Diagnosis not present

## 2022-12-04 DIAGNOSIS — M79676 Pain in unspecified toe(s): Secondary | ICD-10-CM | POA: Diagnosis not present

## 2022-12-04 DIAGNOSIS — L03032 Cellulitis of left toe: Secondary | ICD-10-CM | POA: Diagnosis not present

## 2022-12-27 ENCOUNTER — Ambulatory Visit: Payer: Medicare PPO | Admitting: Family Medicine

## 2022-12-27 ENCOUNTER — Encounter: Payer: Self-pay | Admitting: Family Medicine

## 2022-12-27 VITALS — BP 126/87 | HR 72 | Temp 97.2°F | Ht 64.0 in | Wt 154.6 lb

## 2022-12-27 DIAGNOSIS — T783XXA Angioneurotic edema, initial encounter: Secondary | ICD-10-CM

## 2022-12-27 MED ORDER — FAMOTIDINE 20 MG PO TABS
20.0000 mg | ORAL_TABLET | Freq: Two times a day (BID) | ORAL | 0 refills | Status: DC
Start: 2022-12-27 — End: 2023-04-10

## 2022-12-27 MED ORDER — LEVOCETIRIZINE DIHYDROCHLORIDE 5 MG PO TABS
5.0000 mg | ORAL_TABLET | Freq: Every evening | ORAL | 1 refills | Status: DC
Start: 2022-12-27 — End: 2023-04-10

## 2022-12-27 MED ORDER — METHYLPREDNISOLONE ACETATE 40 MG/ML IJ SUSP
40.0000 mg | Freq: Once | INTRAMUSCULAR | Status: AC
Start: 2022-12-27 — End: 2022-12-27
  Administered 2022-12-27: 40 mg via INTRAMUSCULAR

## 2022-12-27 MED ORDER — EPINEPHRINE 0.3 MG/0.3ML IJ SOAJ: 0.3000 mg | INTRAMUSCULAR | 1 refills | Status: DC | PRN

## 2022-12-27 MED ORDER — PREDNISONE 20 MG PO TABS
40.0000 mg | ORAL_TABLET | Freq: Every day | ORAL | 0 refills | Status: AC
Start: 2022-12-27 — End: 2023-01-01

## 2022-12-27 NOTE — Progress Notes (Signed)
Subjective:  Patient ID: Amber Rivas, female    DOB: 1945-10-13, 77 y.o.   MRN: [id number]  Patient Care Team: Raliegh Ip, DO as PCP - General (Family Medicine) Jonelle Sidle, MD as PCP - Cardiology (Cardiology) Dr [facility], OD (Optometry) Dione Booze, Bertram Millard, MD as Consulting Physician (Ophthalmology)   Chief Complaint:  Oral Swelling (Patient states that she noticed in the beginning of the week that her bottom lip has been swelling )   HPI: Amber Rivas is a 77 y.o. female presenting on 12/27/2022 for Oral Swelling (Patient states that she noticed in the beginning of the week that her bottom lip has been swelling )   Pt presents today with complaints of upper and lower lip swelling. States this started early this week in her upper lip. States today she noticed slight swelling in her lower lip and some "burning" in her tongue. She denies any new medications, household products, personal hygiene products, insect bites, or sick contacts. No ACEi or ARB use. She denies tongue swelling, throat swelling, cough, trouble swallowing, wheezing, stridor, shortness of breath, chest pain, diaphoresis, rash, abdominal pain, nausea, vomiting, or syncope. She has not taken anything for her symptoms. States she is under a lot of stress but does not feel this is causative.      Relevant past medical, surgical, family, and social history reviewed and updated as indicated.  Allergies and medications reviewed and updated. Data reviewed: Chart in Epic.   Past Medical History:  Diagnosis Date   Arthritis    Cancer [facility])    Thyroid   History of hiatal hernia    History of pneumonia    Hyperthyroidism    Migraine     Past Surgical History:  Procedure Laterality Date   ANKLE SURGERY Right    BREAST EXCISIONAL BIOPSY Right    over 20 years ago   CATARACT EXTRACTION, BILATERAL     CHOLECYSTECTOMY  2005   COLONOSCOPY N/A 04/28/2015   Procedure:  COLONOSCOPY;  Surgeon: Malissa Hippo, MD;  Location: [facility];  Service: Endoscopy;  Laterality: N/A;  730   MOUTH SURGERY     THYROIDECTOMY Left 02/07/2017   Procedure: TOTAL THYROIDECTOMY;  Surgeon: Karie Soda, MD;  Location: [facility] OR[facility];  Service: General;  Laterality: Left;  GENERAL AND LOCAL     Social History   Socioeconomic History   Marital status: Married    Spouse name: Leonette Most   Number of children: 4   Years of education: Bachelors Degree   Highest education level: Bachelor's degree (e.g., BA, AB, BS)  Occupational History   Occupation: Retired    Associate Professor: [employer]  Tobacco Use   Smoking status: Never   Smokeless tobacco: Never  Vaping Use   Vaping status: Never Used  Substance and Sexual Activity   Alcohol use: Not Currently    Comment: rare   Drug use: No   Sexual activity: Yes    Birth control/protection: Post-menopausal  Other Topics Concern   Not on file  Social History Narrative   Son living with them right now while going through his divorce - 04/2021   Social Determinants of Health   Financial Resource Strain: Low Risk  (05/03/2022)   Overall Financial Resource Strain (CARDIA)    Difficulty of Paying Living Expenses: Not hard at all  Food Insecurity: No Food Insecurity (05/03/2022)   Hunger Vital Sign    Worried About Running Out of Food  in the Last Year: Never true    Ran Out of Food in the Last Year: Never true  Transportation Needs: No Transportation Needs (05/03/2022)   PRAPARE - Administrator, Civil Service (Medical): No    Lack of Transportation (Non-Medical): No  Physical Activity: Unknown (05/03/2022)   Exercise Vital Sign    Days of Exercise per Week: 3 days    Minutes of Exercise per Session: Patient declined  Stress: No Stress Concern Present (05/03/2022)   [employer] of Occupational Health - Occupational Stress Questionnaire    Feeling of Stress : Only a little  Social Connections: Unknown (05/03/2022)    Social Connection and Isolation Panel [NHANES]    Frequency of Communication with Friends and Family: More than three times a week    Frequency of Social Gatherings with Friends and Family: Patient declined    Attends Religious Services: Patient declined    Database administrator or Organizations: Patient declined    Attends Banker Meetings: Patient declined    Marital Status: Married  Catering manager Violence: Not At Risk (05/09/2022)   Humiliation, Afraid, Rape, and Kick questionnaire    Fear of Current or Ex-Partner: No    Emotionally Abused: No    Physically Abused: No    Sexually Abused: No    Outpatient Encounter Medications as of 12/27/2022  Medication Sig   aspirin EC 81 MG tablet Take 81 mg by mouth daily as needed for mild pain or moderate pain.    B-COMPLEX-C PO Take 1 tablet by mouth daily.   EPINEPHrine 0.3 mg/0.3 mL IJ SOAJ injection Inject 0.3 mg into the muscle as needed for anaphylaxis.   famotidine (PEPCID) 20 MG tablet Take 1 tablet (20 mg total) by mouth 2 (two) times daily for 14 days.   fexofenadine (ALLEGRA ALLERGY) 180 MG tablet Take 1 tablet (180 mg total) by mouth daily.   levocetirizine (XYZAL) 5 MG tablet Take 1 tablet (5 mg total) by mouth every evening.   levothyroxine (SYNTHROID) 100 MCG tablet Take 1 tablet (100 mcg total) by mouth daily.   Multiple Vitamin (MULTIVITAMIN WITH MINERALS) TABS tablet Take 1 tablet by mouth daily.   Omega-3 Fatty Acids (OMEGA 3 PO) Take 1 capsule by mouth daily.    predniSONE (DELTASONE) 20 MG tablet Take 2 tablets (40 mg total) by mouth daily with breakfast for 5 days. 2 po daily for 5 days   [DISCONTINUED] amoxicillin-clavulanate (AUGMENTIN) 875-125 MG tablet Take 1 tablet by mouth 2 (two) times daily.   [DISCONTINUED] triamcinolone (NASACORT) 55 MCG/ACT AERO nasal inhaler Place 2 sprays into the nose daily.   [EXPIRED] methylPREDNISolone acetate (DEPO-MEDROL) injection 40 mg    No facility-administered  encounter medications on file as of 12/27/2022.    Allergies  Allergen Reactions   Elemental Sulfur Anaphylaxis    Review of Systems  Constitutional:  Negative for activity change, appetite change, chills, diaphoresis, fatigue, fever and unexpected weight change.  HENT:  Positive for facial swelling. Negative for congestion, dental problem, drooling, ear discharge, ear pain, hearing loss, mouth sores, nosebleeds, postnasal drip, rhinorrhea, sinus pressure, sinus pain, sneezing, sore throat, tinnitus, trouble swallowing and voice change.   Eyes: Negative.  Negative for photophobia and visual disturbance.  Respiratory:  Negative for apnea, cough, choking, chest tightness, shortness of breath, wheezing and stridor.   Cardiovascular:  Negative for chest pain, palpitations and leg swelling.  Gastrointestinal:  Negative for abdominal distention, abdominal pain, blood in stool, constipation, diarrhea,  nausea and vomiting.  Endocrine: Negative.   Genitourinary:  Negative for decreased urine volume, difficulty urinating, dysuria, frequency and urgency.  Musculoskeletal:  Negative for arthralgias and myalgias.  Skin: Negative.   Allergic/Immunologic: Negative.   Neurological:  Negative for dizziness, tremors, seizures, syncope, facial asymmetry, speech difficulty, weakness, light-headedness, numbness and headaches.  Hematological: Negative.   Psychiatric/Behavioral:  Negative for agitation, behavioral problems, confusion, decreased concentration, hallucinations, sleep disturbance and suicidal ideas.   All other systems reviewed and are negative.       Objective:  BP 126/87   Pulse 72   Temp (!) 97.2 F (36.2 C) (Temporal)   Ht 5\' 4"  (1.626 m)   Wt 154 lb 9.6 oz (70.1 kg)   SpO2 96%   BMI 26.54 kg/m    Wt Readings from Last 3 Encounters:  12/27/22 154 lb 9.6 oz (70.1 kg)  12/03/22 151 lb (68.5 kg)  05/30/22 170 lb (77.1 kg)    Physical Exam Vitals and nursing note reviewed.   Constitutional:      General: She is not in acute distress.    Appearance: Normal appearance. She is well-developed and well-groomed. She is not ill-appearing, toxic-appearing or diaphoretic.  HENT:     Head: Normocephalic and atraumatic.     Right Ear: Hearing, tympanic membrane, ear canal and external ear normal.     Left Ear: Hearing, tympanic membrane, ear canal and external ear normal.     Nose: Nose normal. No congestion or rhinorrhea.     Mouth/Throat:     Lips: Pink.     Mouth: Mucous membranes are moist. Angioedema present.     Pharynx: Oropharynx is clear.     Comments: Angioedema to upper and lower lips, more prevalent on right side.  Eyes:     General: Lids are normal.     Conjunctiva/sclera: Conjunctivae normal.     Pupils: Pupils are equal, round, and reactive to light.  Cardiovascular:     Rate and Rhythm: Normal rate and regular rhythm.     Pulses: Normal pulses.     Heart sounds: Normal heart sounds.  Pulmonary:     Effort: Pulmonary effort is normal. No respiratory distress.     Breath sounds: Normal breath sounds. No stridor. No wheezing, rhonchi or rales.  Chest:     Chest wall: No tenderness.  Abdominal:     General: Bowel sounds are normal.     Palpations: Abdomen is soft.  Musculoskeletal:     Cervical back: Normal range of motion and neck supple.     Right lower leg: No edema.     Left lower leg: No edema.  Skin:    General: Skin is warm and dry.     Capillary Refill: Capillary refill takes less than 2 seconds.  Neurological:     General: No focal deficit present.     Mental Status: She is alert and oriented to person, place, and time.     Cranial Nerves: No cranial nerve deficit.     Sensory: No sensory deficit.     Motor: No weakness.     Coordination: Coordination normal.     Gait: Gait normal.     Deep Tendon Reflexes: Reflexes normal.  Psychiatric:        Mood and Affect: Mood normal.        Behavior: Behavior normal. Behavior is  cooperative.        Thought Content: Thought content normal.  Judgment: Judgment normal.     Results for orders placed or performed in visit on 05/30/22  TSH  Result Value Ref Range   TSH 0.231 (L) 0.450 - 4.500 uIU/mL  T4, Free  Result Value Ref Range   Free T4 1.87 (H) 0.82 - 1.77 ng/dL  [id number]  Result Value Ref Range   Glucose 94 70 - 99 mg/dL   BUN 13 8 - 27 mg/dL   Creatinine, Ser 8.29 0.57 - 1.00 mg/dL   eGFR 91 >56 OZ/HYQ/6.57   BUN/Creatinine Ratio 19 12 - 28   Sodium 139 134 - 144 mmol/L   Potassium 4.1 3.5 - 5.2 mmol/L   Chloride 100 96 - 106 mmol/L   CO2 22 20 - 29 mmol/L   Calcium 9.4 8.7 - 10.3 mg/dL   Total Protein 6.8 6.0 - 8.5 g/dL   Albumin 4.3 3.8 - 4.8 g/dL   Globulin, Total 2.5 1.5 - 4.5 g/dL   Albumin/Globulin Ratio 1.7 1.2 - 2.2   Bilirubin Total 0.2 0.0 - 1.2 mg/dL   Alkaline Phosphatase 85 44 - 121 IU/L   AST 19 0 - 40 IU/L   ALT 19 0 - 32 IU/L  LDL Cholesterol, Direct  Result Value Ref Range   LDL Direct 116 (H) 0 - 99 mg/dL  CBC  Result Value Ref Range   WBC 7.0 3.4 - 10.8 x10E3/uL   RBC 4.84 3.77 - 5.28 x10E6/uL   Hemoglobin 13.9 11.1 - 15.9 g/dL   Hematocrit 84.6 96.2 - 46.6 %   MCV 86 79 - 97 fL   MCH 28.7 26.6 - 33.0 pg   MCHC 33.6 31.5 - 35.7 g/dL   RDW 95.2 84.1 - 32.4 %   Platelets 302 150 - 450 x10E3/uL       Pertinent labs & imaging results that were available during my care of the patient were reviewed by me and considered in my medical decision making.  Assessment & Plan:  Amber Rivas was seen today for oral swelling.  Diagnoses and all orders for this visit:  Angioedema, initial encounter No indications of acute anaphylaxis. These red flags were discussed in detail with pt and she was thoroughly educated on when to seek emergency care. Epi pen sent to pharmacy, aware if this does get used, to call 911/go to ED. Will burst with steroids in office. Start oral prednisone tomorrow. Start Pepcid and levocetirizine as  prescribed. Referral to allergist placed. Pt to follow up tomorrow for reevaluation.  -     EPINEPHrine 0.3 mg/0.3 mL IJ SOAJ injection; Inject 0.3 mg into the muscle as needed for anaphylaxis. -     famotidine (PEPCID) 20 MG tablet; Take 1 tablet (20 mg total) by mouth 2 (two) times daily for 14 days. -     predniSONE (DELTASONE) 20 MG tablet; Take 2 tablets (40 mg total) by mouth daily with breakfast for 5 days. 2 po daily for 5 days -     levocetirizine (XYZAL) 5 MG tablet; Take 1 tablet (5 mg total) by mouth every evening. -     Ambulatory referral to Allergy -     methylPREDNISolone acetate (DEPO-MEDROL) injection 40 mg     Continue all other maintenance medications.  Follow up plan: Return in about 1 day (around 12/28/2022), or if symptoms worsen or fail to improve, for angioedema.   Continue healthy lifestyle choices, including diet (rich in fruits, vegetables, and lean proteins, and low in salt and simple carbohydrates) and exercise (at  least 30 minutes of moderate physical activity daily).  Educational handout given for angioedema   The above assessment and management plan was discussed with the patient. The patient verbalized understanding of and has agreed to the management plan. Patient is aware to call the clinic if they develop any new symptoms or if symptoms persist or worsen. Patient is aware when to return to the clinic for a follow-up visit. Patient educated on when it is appropriate to go to the emergency department.   Amber Baars, FNP-C [facility] [phone]

## 2023-02-04 ENCOUNTER — Telehealth: Payer: Self-pay | Admitting: *Deleted

## 2023-02-04 NOTE — Telephone Encounter (Signed)
Please clarify which medication the patient is referencing. "Buthrothen" is not a medication that I am aware of

## 2023-02-04 NOTE — Telephone Encounter (Signed)
Spoke with pt she is referring to wellbutrin. Pt has not been prescribed wellbutrin since 2023 and stated she had some left over and just started taking them. Informed pt she would need an appt before she can be prescribed the medication again, appt made

## 2023-02-04 NOTE — Telephone Encounter (Signed)
Copied from CRM [id number]. Topic: Clinical - Medication Question >> Feb 04, 2023 12:06 PM Deaijah H wrote: Reason for CRM: Pt called in regarding Buthrothen 150mg  once a day asked for something more calmer having trouble sleeping

## 2023-04-09 DIAGNOSIS — F4323 Adjustment disorder with mixed anxiety and depressed mood: Secondary | ICD-10-CM | POA: Diagnosis not present

## 2023-04-10 ENCOUNTER — Encounter: Payer: Self-pay | Admitting: Family Medicine

## 2023-04-10 ENCOUNTER — Ambulatory Visit: Payer: Medicare PPO | Admitting: Family Medicine

## 2023-04-10 VITALS — BP 146/81 | HR 68 | Temp 98.7°F | Ht 64.0 in | Wt 171.0 lb

## 2023-04-10 DIAGNOSIS — E890 Postprocedural hypothyroidism: Secondary | ICD-10-CM | POA: Diagnosis not present

## 2023-04-10 DIAGNOSIS — F439 Reaction to severe stress, unspecified: Secondary | ICD-10-CM

## 2023-04-10 DIAGNOSIS — Z23 Encounter for immunization: Secondary | ICD-10-CM

## 2023-04-10 DIAGNOSIS — F339 Major depressive disorder, recurrent, unspecified: Secondary | ICD-10-CM | POA: Diagnosis not present

## 2023-04-10 MED ORDER — BUPROPION HCL 75 MG PO TABS: 75.0000 mg | ORAL_TABLET | Freq: Two times a day (BID) | ORAL | 0 refills | Status: DC

## 2023-04-10 NOTE — Progress Notes (Signed)
 Subjective: CC: Follow-up stress, depression PCP: Eliodoro Guerin, DO CWC:[facility] W Amber Rivas is a 78 y.o. female presenting to clinic today for:  1.  Recurrent depression Patient with history of recurrent depression.  She was previously treated with Wellbutrin  and this worked well for her.  She self discontinued as she was doing well for a while but has been having some increasing symptomology secondary to multiple stressors that have to do with her son and spouse.  She also notes that she had some family members passed away over the last year.  She started seeing a new counselor, Ronelle Coffee out in [place].  She is really had a great start with her and is looking forward to her next visit next week.  She will be seeing her every 2 weeks for counseling services.  She already feels like she has gotten more peace after their first visit.  She does note that she is been having some sleep issues and this seemed to be alleviated by Wellbutrin  previously so she would like to revisit that medication.   ROS: Per HPI  Allergies  Allergen Reactions   Elemental Sulfur Anaphylaxis   Past Medical History:  Diagnosis Date   Arthritis    Cancer (HCC)    Thyroid    History of hiatal hernia    History of pneumonia    Hyperthyroidism    Migraine     Current Outpatient Medications:    aspirin  EC 81 MG tablet, Take 81 mg by mouth daily as needed for mild pain or moderate pain. , Disp: , Rfl:    B-COMPLEX-C PO, Take 1 tablet by mouth daily., Disp: , Rfl:    EPINEPHrine  0.3 mg/0.3 mL IJ SOAJ injection, Inject 0.3 mg into the muscle as needed for anaphylaxis., Disp: 1 each, Rfl: 1   levothyroxine  (SYNTHROID ) 100 MCG tablet, Take 1 tablet (100 mcg total) by mouth daily., Disp: 90 tablet, Rfl: 3   Multiple Vitamin (MULTIVITAMIN WITH MINERALS) TABS tablet, Take 1 tablet by mouth daily., Disp: , Rfl:    Omega-3 Fatty Acids (OMEGA 3 PO), Take 1 capsule by mouth daily. , Disp: , Rfl:  Social History    Socioeconomic History   Marital status: Married    Spouse name: Ethelle Herb   Number of children: 4   Years of education: Bachelors Degree   Highest education level: Bachelor's degree (e.g., BA, AB, BS)  Occupational History   Occupation: Retired    Associate Professor: [employer]  Tobacco Use   Smoking status: Never   Smokeless tobacco: Never  Vaping Use   Vaping status: Never Used  Substance and Sexual Activity   Alcohol use: Not Currently    Comment: rare   Drug use: No   Sexual activity: Yes    Birth control/protection: Post-menopausal  Other Topics Concern   Not on file  Social History Narrative   Son living with them right now while going through his divorce - 04/2021   Social Drivers of Health   Financial Resource Strain: Low Risk  (05/03/2022)   Overall Financial Resource Strain (CARDIA)    Difficulty of Paying Living Expenses: Not hard at all  Food Insecurity: No Food Insecurity (05/03/2022)   Hunger Vital Sign    Worried About Running Out of Food in the Last Year: Never true    Ran Out of Food in the Last Year: Never true  Transportation Needs: No Transportation Needs (05/03/2022)   PRAPARE - Administrator, Civil Service (Medical):  No    Lack of Transportation (Non-Medical): No  Physical Activity: Unknown (05/03/2022)   Exercise Vital Sign    Days of Exercise per Week: 3 days    Minutes of Exercise per Session: Patient declined  Stress: No Stress Concern Present (05/03/2022)   [employer] of Occupational Health - Occupational Stress Questionnaire    Feeling of Stress : Only a little  Social Connections: Unknown (05/03/2022)   Social Connection and Isolation Panel [NHANES]    Frequency of Communication with Friends and Family: More than three times a week    Frequency of Social Gatherings with Friends and Family: Patient declined    Attends Religious Services: Patient declined    Database administrator or Organizations: Patient declined    Attends  Banker Meetings: Patient declined    Marital Status: Married  Catering manager Violence: Not At Risk (05/09/2022)   Humiliation, Afraid, Rape, and Kick questionnaire    Fear of Current or Ex-Partner: No    Emotionally Abused: No    Physically Abused: No    Sexually Abused: No   Family History  Problem Relation Age of Onset   Lung disease Sister    Hypothyroidism Sister    Breast cancer Maternal Aunt    Colon cancer Brother    Cancer Brother    Cancer Brother    Early death Brother     Objective: Office vital signs reviewed. BP (!) 146/81   Pulse 68   Temp 98.7 F (37.1 C)   Ht 5\' 4"  (1.626 m)   Wt 171 lb (77.6 kg)   SpO2 98%   BMI 29.35 kg/m   Physical Examination:  General: Awake, alert, well nourished, No acute distress Psych: Mood stable, speech normal, affect appropriate.  Very pleasant, interactive     04/10/2023   12:58 PM 05/30/2022    4:18 PM 05/09/2022   12:09 PM  Depression screen PHQ 2/9  Decreased Interest 0 0 0  Down, Depressed, Hopeless 0 0 0  PHQ - 2 Score 0 0 0  Altered sleeping 0 0   Tired, decreased energy 0 0   Change in appetite 0 0   Feeling bad or failure about yourself  0 0   Trouble concentrating 0 0   Moving slowly or fidgety/restless 0 0   Suicidal thoughts 0 0   PHQ-9 Score 0 0   Difficult doing work/chores Not difficult at all Not difficult at all       04/10/2023   12:58 PM 05/30/2022    4:18 PM 07/28/2021    1:59 PM 04/26/2021    1:14 PM  GAD 7 : Generalized Anxiety Score  Nervous, Anxious, on Edge 1 0 1 0  Control/stop worrying 0 0 1 0  Worry too much - different things 0 0 2 0  Trouble relaxing 0 0 0 0  Restless 0 0 0 0  Easily annoyed or irritable 0 0 3 0  Afraid - awful might happen 0 0 0 0  Total GAD 7 Score 1 0 7 0  Anxiety Difficulty  Not difficult at all Somewhat difficult Not difficult at all   Assessment/ Plan: 78 y.o. female   Depression, recurrent (HCC) - Plan: buPROPion  (WELLBUTRIN ) 75 MG  tablet  Stress at home - Plan: buPROPion  (WELLBUTRIN ) 75 MG tablet  Encounter for immunization - Plan: Flu Vaccine Trivalent High Dose (Fluad)  Postoperative hypothyroidism - Plan: Lipid panel, TSH, T4, free, CMP14+EGFR  Wellbutrin  sent into pharmacy.  May use up to 150 mg daily.  Will like to see her back in the next 6 to 8 weeks, sooner if concerns arise  Influenza vaccine administered  Thyroid  levels completed along with lipid panel and CMP today.  Uncertain if thyroid  is causing the above exacerbation.   Eliodoro Guerin, DO [facility] [phone]

## 2023-04-11 LAB — LIPID PANEL
Chol/HDL Ratio: 2.8 {ratio} (ref 0.0–4.4)
Cholesterol, Total: 237 mg/dL — ABNORMAL HIGH (ref 100–199)
HDL: 85 mg/dL (ref >39)
LDL Chol Calc (NIH): 124 mg/dL — ABNORMAL HIGH (ref 0–99)
Triglycerides: 164 mg/dL — ABNORMAL HIGH (ref 0–149)
VLDL Cholesterol Cal: 28 mg/dL (ref 5–40)

## 2023-04-11 LAB — CMP14+EGFR
ALT: 17 [IU]/L (ref 0–32)
AST: 16 [IU]/L (ref 0–40)
Albumin: 4.4 g/dL (ref 3.8–4.8)
Alkaline Phosphatase: 97 [IU]/L (ref 44–121)
BUN/Creatinine Ratio: 19 (ref 12–28)
BUN: 10 mg/dL (ref 8–27)
Bilirubin Total: 0.4 mg/dL (ref 0.0–1.2)
CO2: 25 mmol/L (ref 20–29)
Calcium: 9.7 mg/dL (ref 8.7–10.3)
Chloride: 96 mmol/L (ref 96–106)
Creatinine, Ser: 0.52 mg/dL — ABNORMAL LOW (ref 0.57–1.00)
Globulin, Total: 2.4 g/dL (ref 1.5–4.5)
Glucose: 87 mg/dL (ref 70–99)
Potassium: 4.1 mmol/L (ref 3.5–5.2)
Sodium: 134 mmol/L (ref 134–144)
Total Protein: 6.8 g/dL (ref 6.0–8.5)
eGFR: 97 mL/min/{1.73_m2} (ref >59)

## 2023-04-11 LAB — TSH: TSH: 1.77 u[IU]/mL (ref 0.450–4.500)

## 2023-04-11 LAB — T4, FREE: Free T4: 1.4 ng/dL (ref 0.82–1.77)

## 2023-04-25 DIAGNOSIS — F4323 Adjustment disorder with mixed anxiety and depressed mood: Secondary | ICD-10-CM | POA: Diagnosis not present

## 2023-04-30 ENCOUNTER — Other Ambulatory Visit: Payer: Self-pay | Admitting: Family Medicine

## 2023-04-30 DIAGNOSIS — E890 Postprocedural hypothyroidism: Secondary | ICD-10-CM

## 2023-05-06 ENCOUNTER — Ambulatory Visit: Payer: Self-pay | Admitting: Family Medicine

## 2023-05-06 NOTE — Telephone Encounter (Signed)
 Chief Complaint: sinus drainage Symptoms: sinus drainage and congestion with green mucus  Frequency: 3 wks Pertinent Negatives: Patient denies fever, SOB, CP Disposition: [] ED /[] Urgent Care (no appt availability in office) / [x] Appointment(In office/virtual)/ []  [facility]/ [] Home Care/ [] Refused Recommended Disposition /[] [facility]/ []  Follow-up with PCP Additional Notes: Pt reports sinus drainage for approx 3 wks. No sinus pain or headache. Endorses green mucus and congestion. Denies cough. Pt states she had a tooth removed on her left side on 1/21. Pt has had "ongoing" sinus issues with the left side, she says. Pt was prescribed Augmentin  by the dentist and has been taking that post-procedure. Pt still taking. States has sinus drainage even with the tooth removed and the antibiotic. Taking Allegra , Advil, and Mucinex  with some relief. No fever. No SOB. Per protocol, pt scheduled in office tomorrow 2/11 at 1020. Pt agreeable to the plan. RN advised pt to call us  back if she gets worse. Copied from CRM [id number]. Topic: Clinical - Red Word Triage >> May 06, 2023  2:02 PM Tisa Forester wrote: Red Word that prompted transfer to Nurse Triage: patient has a chronic thing going on left side sinus issue can not get rid of the sinus , sinus drainage,congestion , discolored green mucus Reason for Disposition  [1] Taking antibiotic > 7 days AND [2] nasal discharge not improved  Answer Assessment - Initial Assessment Questions 1. LOCATION: "Where does it hurt?"      No pain, "eye feels weak on the left side" 2. ONSET: "When did the sinus pain start?"  (e.g., hours, days)      3 wks, left-sided sinus problem "for a long time." Had a tooth removed on the left side (1/21) and took an antibiotic after. Symptoms worsened once she finished the antibiotic.  3. SEVERITY: "How bad is the pain?"   (Scale 1-10; mild, moderate or severe)   - MILD (1-3): doesn't interfere with normal activities     - MODERATE (4-7): interferes with normal activities (e.g., work or school) or awakens from sleep   - SEVERE (8-10): excruciating pain and patient unable to do any normal activities        0 4. RECURRENT SYMPTOM: "Have you ever had sinus problems before?" If Yes, ask: "When was the last time?" and "What happened that time?"      Ongoing sinus issue on the left side, headache on that left side is normal for her. Now she has drainage, which is not normal. 5. NASAL CONGESTION: "Is the nose blocked?" If Yes, ask: "Can you open it or must you breathe through your mouth?"     Yes "mostly at night when I lay down, left side of my head gets stopped up" 6. NASAL DISCHARGE: "Do you have discharge from your nose?" If so ask, "What color?"     Green 7. FEVER: "Do you have a fever?" If Yes, ask: "What is it, how was it measured, and when did it start?"      No 8. OTHER SYMPTOMS: "Do you have any other symptoms?" (e.g., sore throat, cough, earache, difficulty breathing)     Taken allergy pill & Mucinex , green mucus when she gets up in the morning from the nose, no cough, a "little achiness" where the tooth was removed, states the hole has mostly closed but dentist said there is some bone exposed. Root canal on same tooth one year ago, now it has been removed.  Answer Assessment - Initial Assessment Questions 1.  ANTIBIOTIC: "What antibiotic are you taking?" "How many times a day?"     Augmentin  2. ONSET: "When was the antibiotic started?"     1/21 3. PAIN: "How bad is the sinus pain?"   (Scale 1-10; mild, moderate or severe)   - MILD (1-3): doesn't interfere with normal activities    - MODERATE (4-7): interferes with normal activities (e.g., work or school) or awakens from sleep   - SEVERE (8-10): excruciating pain and patient unable to do any normal activities        0 4. FEVER: "Do you have a fever?" If Yes, ask: "What is it, how was it measured, and when did it start?"      No 5. SYMPTOMS: "Are there  any other symptoms you're concerned about?" If Yes, ask: "When did it start?"     Sinus congestion, drainage. Was taking antibiotic for a tooth removal. Not prescribed for tooth removal. Now taking nasal decongestant and allergy pills.  Protocols used: Sinus Pain or Congestion-A-AH, Sinus Infection on Antibiotic Follow-up Call-A-AH

## 2023-05-07 ENCOUNTER — Telehealth: Payer: Medicare PPO | Admitting: Family

## 2023-05-07 ENCOUNTER — Encounter: Payer: Self-pay | Admitting: Family

## 2023-05-07 DIAGNOSIS — J019 Acute sinusitis, unspecified: Secondary | ICD-10-CM

## 2023-05-07 MED ORDER — DOXYCYCLINE HYCLATE 100 MG PO TABS: 100.0000 mg | ORAL_TABLET | Freq: Two times a day (BID) | ORAL | 0 refills | Status: DC

## 2023-05-07 NOTE — Progress Notes (Signed)
Virtual Visit Consent   Amber Rivas, you are scheduled for a virtual visit with a [facility] provider today. Just as with appointments in the office, your consent must be obtained to participate. Your consent will be active for this visit and any virtual visit you may have with one of our providers in the next 365 days. If you have a MyChart account, a copy of this consent can be sent to you electronically.  As this is a virtual visit, video technology does not allow for your provider to perform a traditional examination. This may limit your provider's ability to fully assess your condition. If your provider identifies any concerns that need to be evaluated in person or the need to arrange testing (such as labs, EKG, etc.), we will make arrangements to do so. Although advances in technology are sophisticated, we cannot ensure that it will always work on either your end or our end. If the connection with a video visit is poor, the visit may have to be switched to a telephone visit. With either a video or telephone visit, we are not always able to ensure that we have a secure connection.  By engaging in this virtual visit, you consent to the provision of healthcare and authorize for your insurance to be billed (if applicable) for the services provided during this visit. Depending on your insurance coverage, you may receive a charge related to this service.  I need to obtain your verbal consent now. Are you willing to proceed with your visit today? Amber Rivas has provided verbal consent on 05/07/2023 for a virtual visit (video or telephone). Jannifer Rodney, FNP  Date: 05/07/2023 10:16 AM   Virtual Visit via Video Note   I, Jannifer Rodney, connected with  Amber Rivas  ([id number], [id number]) on 05/07/23 at 10:25 AM EST by a video-enabled telemedicine application and verified that I am speaking with the correct person using two identifiers.  Location: Patient: Virtual Visit Location Patient:  Home Provider: Virtual Visit Location Provider: Home Office   I discussed the limitations of evaluation and management by telemedicine and the availability of in person appointments. The patient expressed understanding and agreed to proceed.    History of Present Illness: Amber Rivas is a 77 y.o. who identifies as a female who was assigned female at birth, and is being seen today for sinus congestion for the last two weeks.   HPI: Sinusitis This is a new problem. The current episode started 1 to 4 weeks ago. The problem has been gradually worsening since onset. There has been no fever. Her pain is at a severity of 2/10. The pain is mild. Associated symptoms include congestion and sinus pressure. Pertinent negatives include no coughing, headaches, sneezing or sore throat. Past treatments include oral decongestants and saline nose sprays. The treatment provided mild relief.    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 , Disp: , Rfl:    B-COMPLEX-C PO, Take 1 tablet by mouth daily., Disp: , Rfl:    buPROPion (WELLBUTRIN) 75 MG tablet, Take 1 tablet (75 mg total) by mouth 2 (two) times daily., Disp: 180 tablet, Rfl: 0   EPINEPHrine 0.3 mg/0.3 mL IJ SOAJ injection, Inject 0.3 mg into the muscle as needed for anaphylaxis., Disp: 1 each, Rfl: 1   levothyroxine (SYNTHROID) 100 MCG tablet, TAKE  1 TABLET BY MOUTH DAILY, Disp: 90 tablet, Rfl: 3   Multiple Vitamin (MULTIVITAMIN WITH MINERALS) TABS tablet, Take 1 tablet by mouth daily., Disp: , Rfl:    Omega-3 Fatty Acids (OMEGA 3 PO), Take 1 capsule by mouth daily. , Disp: , Rfl:   Observations/Objective: Patient is well-developed, well-nourished in no acute distress.  Resting comfortably  at home.  Head is normocephalic, atraumatic.  No labored breathing.  Speech is clear and coherent with logical content.  Patient is alert and oriented at baseline.  Nasal congestion and sinus pressure on left maxillary   Assessment and Plan: 1. Acute sinusitis, recurrence not specified, unspecified location (Primary) - doxycycline (VIBRA-TABS) 100 MG tablet; Take 1 tablet (100 mg total) by mouth 2 (two) times daily.  Dispense: 20 tablet; Refill: 0  - Take meds as prescribed - Use a cool mist humidifier  -Use saline nose sprays frequently -Force fluids -For any cough or congestion  Use plain Mucinex- regular strength or max strength is fine -For fever or aces or pains- take tylenol or ibuprofen. -Throat lozenges if help -Follow up if symptoms worsen or do not improved    Follow Up Instructions: I discussed the assessment and treatment plan with the patient. The patient was provided an opportunity to ask questions and all were answered. The patient agreed with the plan and demonstrated an understanding of the instructions.  A copy of instructions were sent to the patient via MyChart unless otherwise noted below.     The patient was advised to call back or seek an in-person evaluation if the symptoms worsen or if the condition fails to improve as anticipated.    Jannifer Rodney, FNP

## 2023-05-09 DIAGNOSIS — F4323 Adjustment disorder with mixed anxiety and depressed mood: Secondary | ICD-10-CM | POA: Diagnosis not present

## 2023-05-13 ENCOUNTER — Ambulatory Visit (INDEPENDENT_AMBULATORY_CARE_PROVIDER_SITE_OTHER): Payer: Medicare PPO

## 2023-05-13 VITALS — Ht 64.0 in | Wt 171.0 lb

## 2023-05-13 DIAGNOSIS — Z0000 Encounter for general adult medical examination without abnormal findings: Secondary | ICD-10-CM | POA: Diagnosis not present

## 2023-05-13 NOTE — Progress Notes (Signed)
Subjective:   Amber Rivas is a 78 y.o. female who presents for Medicare Annual (Subsequent) preventive examination.  Visit Complete: Virtual I connected with  Amber Rivas on 05/13/23 by a audio enabled telemedicine application and verified that I am speaking with the correct person using two identifiers.  Patient Location: Home  Provider Location: Home Office  This patient declined Interactive audio and video telecommunications. Therefore the visit was completed with audio only.  I discussed the limitations of evaluation and management by telemedicine. The patient expressed understanding and agreed to proceed.  Vital Signs: Because this visit was a virtual/telehealth visit, some criteria may be missing or patient reported. Any vitals not documented were not able to be obtained and vitals that have been documented are patient reported.  Cardiac Risk Factors include: advanced age (>45men, >7 women)     Objective:    Today's Vitals   05/13/23 1446  Weight: 171 lb (77.6 kg)  Height: 5\' 4"  (1.626 m)   Body mass index is 29.35 kg/m.     05/13/2023    2:51 PM 05/09/2022   12:11 PM 05/08/2021   12:08 PM 05/05/2020    4:16 PM 03/14/2020   11:02 AM 02/07/2017    9:35 AM 02/04/2017    1:10 PM  Advanced Directives  Does Patient Have a Medical Advance Directive? No Yes Yes Yes Yes Yes Yes  Type of Furniture conservator/restorer;Living will Healthcare Power of [place];Living will Healthcare Power of [place];Living will  Healthcare Power of [place];Living will Healthcare Power of [place];Living will  Does patient want to make changes to medical advance directive?    No - Patient declined  No - Patient declined   Copy of Healthcare Power of Attorney in Chart?  No - copy requested No - copy requested No - copy requested     Would patient like information on creating a medical advance directive? Yes (MAU/Ambulatory/Procedural Areas - Information given)           Current Medications (verified) Outpatient Encounter Medications as of 05/13/2023  Medication Sig   aspirin EC 81 MG tablet Take 81 mg by mouth daily as needed for mild pain or moderate pain.    B-COMPLEX-C PO Take 1 tablet by mouth daily.   buPROPion (WELLBUTRIN) 75 MG tablet Take 1 tablet (75 mg total) by mouth 2 (two) times daily.   doxycycline (VIBRA-TABS) 100 MG tablet Take 1 tablet (100 mg total) by mouth 2 (two) times daily.   EPINEPHrine 0.3 mg/0.3 mL IJ SOAJ injection Inject 0.3 mg into the muscle as needed for anaphylaxis.   levothyroxine (SYNTHROID) 100 MCG tablet TAKE 1 TABLET BY MOUTH DAILY   Multiple Vitamin (MULTIVITAMIN WITH MINERALS) TABS tablet Take 1 tablet by mouth daily.   Omega-3 Fatty Acids (OMEGA 3 PO) Take 1 capsule by mouth daily.    No facility-administered encounter medications on file as of 05/13/2023.    Allergies (verified) Elemental sulfur   History: Past Medical History:  Diagnosis Date   Arthritis    Cancer (HCC)    Thyroid   History of hiatal hernia    History of pneumonia    Hyperthyroidism    Migraine    Past Surgical History:  Procedure Laterality Date   ANKLE SURGERY Right    BREAST EXCISIONAL BIOPSY Right    over 20 years ago   CATARACT EXTRACTION, BILATERAL     CHOLECYSTECTOMY  2005   COLONOSCOPY N/A 04/28/2015   Procedure: COLONOSCOPY;  Surgeon: Malissa Hippo, MD;  Location: [facility];  Service: Endoscopy;  Laterality: N/A;  730   MOUTH SURGERY     THYROIDECTOMY Left 02/07/2017   Procedure: TOTAL THYROIDECTOMY;  Surgeon: Karie Soda, MD;  Location: [facility];  Service: General;  Laterality: Left;  GENERAL AND LOCAL    Family History  Problem Relation Age of Onset   Lung disease Sister    Hypothyroidism Sister    Breast cancer Maternal Aunt    Colon cancer Brother    Cancer Brother    Cancer Brother    Early death Brother    Social History   Socioeconomic History   Marital status: Married    Spouse name: Leonette Most    Number of children: 4   Years of education: Bachelors Degree   Highest education level: Bachelor's degree (e.g., BA, AB, BS)  Occupational History   Occupation: Retired    Associate Professor: [employer]  Tobacco Use   Smoking status: Never   Smokeless tobacco: Never  Vaping Use   Vaping status: Never Used  Substance and Sexual Activity   Alcohol use: Not Currently    Comment: rare   Drug use: No   Sexual activity: Yes    Birth control/protection: Post-menopausal  Other Topics Concern   Not on file  Social History Narrative   Son living with them right now while going through his divorce - 04/2021   Social Drivers of Health   Financial Resource Strain: Low Risk  (05/13/2023)   Overall Financial Resource Strain (CARDIA)    Difficulty of Paying Living Expenses: Not hard at all  Food Insecurity: No Food Insecurity (05/13/2023)   Hunger Vital Sign    Worried About Running Out of Food in the Last Year: Never true    Ran Out of Food in the Last Year: Never true  Transportation Needs: No Transportation Needs (05/13/2023)   PRAPARE - Administrator, Civil Service (Medical): No    Lack of Transportation (Non-Medical): No  Physical Activity: Inactive (05/13/2023)   Exercise Vital Sign    Days of Exercise per Week: 0 days    Minutes of Exercise per Session: 0 min  Stress: No Stress Concern Present (05/13/2023)   [employer] of Occupational Health - Occupational Stress Questionnaire    Feeling of Stress : Not at all  Social Connections: Unknown (05/13/2023)   Social Connection and Isolation Panel [NHANES]    Frequency of Communication with Friends and Family: More than three times a week    Frequency of Social Gatherings with Friends and Family: Patient declined    Attends Religious Services: Patient declined    Database administrator or Organizations: Patient declined    Attends Engineer, structural: Patient declined    Marital Status: Married     Tobacco Counseling Counseling given: Not Answered   Clinical Intake:  Pre-visit preparation completed: Yes  Pain : No/denies pain     Diabetes: No  How often do you need to have someone help you when you read instructions, pamphlets, or other written materials from your doctor or pharmacy?: 1 - Never  Interpreter Needed?: No  Information entered by :: Kandis Fantasia LPN   Activities of Daily Living    05/13/2023    2:51 PM  In your present state of health, do you have any difficulty performing the following activities:  Hearing? 0  Vision? 0  Difficulty concentrating or making decisions? 0  Walking or climbing stairs?  0  Dressing or bathing? 0  Doing errands, shopping? 0  Preparing Food and eating ? N  Using the Toilet? N  In the past six months, have you accidently leaked urine? N  Do you have problems with loss of bowel control? N  Managing your Medications? N  Managing your Finances? N  Housekeeping or managing your Housekeeping? N    Patient Care Team: Raliegh Ip, DO as PCP - General (Family Medicine) Jonelle Sidle, MD as PCP - Cardiology (Cardiology) Dr [facility], OD (Optometry) Dione Booze, Bertram Millard, MD as Consulting Physician (Ophthalmology)  Indicate any recent Medical Services you may have received from other than [facility] providers in the past year (date may be approximate).     Assessment:   This is a routine wellness examination for [place].  Hearing/Vision screen Hearing Screening - Comments:: Denies hearing difficulties   Vision Screening - Comments:: Wears rx glasses - up to date with routine eye exams with Dr. Desiree Lucy     Goals Addressed             This Visit's Progress    COMPLETED: Patient Stated       05/05/2020 AWV Goal: Exercise for General Health  Patient will verbalize understanding of the benefits of increased physical activity: Exercising regularly is important. It will improve  your overall fitness, flexibility, and endurance. Regular exercise also will improve your overall health. It can help you control your weight, reduce stress, and improve your bone density. Over the next year, patient will increase physical activity as tolerated with a goal of at least 150 minutes of moderate physical activity per week.  You can tell that you are exercising at a moderate intensity if your heart starts beating faster and you start breathing faster but can still hold a conversation. Moderate-intensity exercise ideas include: Walking 1 mile (1.6 km) in about 15 minutes Biking Hiking Golfing Dancing Water aerobics Patient will verbalize understanding of everyday activities that increase physical activity by providing examples like the following: Yard work, such as: Insurance underwriter Gardening Washing windows or floors Patient will be able to explain general safety guidelines for exercising:  Before you start a new exercise program, talk with your health care provider. Do not exercise so much that you hurt yourself, feel dizzy, or get very short of breath. Wear comfortable clothes and wear shoes with good support. Drink plenty of water while you exercise to prevent dehydration or heat stroke. Work out until your breathing and your heartbeat get faster.      Remain active and independent        Depression Screen    05/13/2023    2:48 PM 04/10/2023   12:58 PM 05/30/2022    4:18 PM 05/09/2022   12:09 PM 07/28/2021    1:59 PM 05/08/2021   12:07 PM 04/26/2021    1:14 PM  PHQ 2/9 Scores  PHQ - 2 Score 0 0 0 0 1 0 0  PHQ- 9 Score 0 0 0  7      Fall Risk    05/13/2023    2:50 PM 04/10/2023   12:52 PM 12/03/2022    1:07 PM 05/30/2022    4:19 PM 05/09/2022   12:03 PM  Fall Risk   Falls in the past year? 0 0 0 0 0  Number falls in past yr: 0 0 0 0 0  Injury  with Fall? 0 0 0 0 0  Risk for fall due to  : No Fall Risks No Fall Risks No Fall Risks No Fall Risks No Fall Risks  Follow up Falls prevention discussed;Education provided;Falls evaluation completed Education provided Education provided Education provided Falls prevention discussed    MEDICARE RISK AT HOME: Medicare Risk at Home Any stairs in or around the home?: No If so, are there any without handrails?: No Home free of loose throw rugs in walkways, pet beds, electrical cords, etc?: Yes Adequate lighting in your home to reduce risk of falls?: Yes Life alert?: No Use of a cane, walker or w/c?: No Grab bars in the bathroom?: Yes Shower chair or bench in shower?: No Elevated toilet seat or a handicapped toilet?: Yes  TIMED UP AND GO:  Was the test performed?  No    Cognitive Function:    12/10/2017   11:35 AM  MMSE - Mini Mental State Exam  Orientation to time 5  Orientation to Place 5  Registration 3  Attention/ Calculation 5  Recall 3  Language- name 2 objects 2  Language- repeat 1  Language- follow 3 step command 3  Language- read & follow direction 1  Write a sentence 1  Copy design 1  Total score 30        05/13/2023    2:51 PM 05/05/2020    4:17 PM  6CIT Screen  What Year? 0 points 0 points  What month? 0 points 0 points  What time? 0 points 0 points  Count back from 20 0 points 0 points  Months in reverse 0 points 0 points  Repeat phrase 0 points 0 points  Total Score 0 points 0 points    Immunizations Immunization History  Administered Date(s) Administered   Fluad Quad(high Dose 65+) 04/26/2021   Fluad Trivalent(High Dose 65+) 04/10/2023   Influenza, High Dose Seasonal PF 02/10/2019   Influenza,inj,Quad PF,6+ Mos 01/14/2017   Influenza,inj,quad, With Preservative 02/12/2017   Moderna Sars-Covid-2 Vaccination 05/20/2019, 06/15/2019, 02/09/2020, 07/19/2020, 01/12/2021   Pneumococcal Conjugate-13 02/13/2019   Pneumococcal Polysaccharide-23 10/24/2020   Zoster Recombinant(Shingrix) 10/24/2020     TDAP status: Due, Education has been provided regarding the importance of this vaccine. Advised may receive this vaccine at local pharmacy or Health Dept. Aware to provide a copy of the vaccination record if obtained from local pharmacy or Health Dept. Verbalized acceptance and understanding.  Flu Vaccine status: Up to date  Pneumococcal vaccine status: Up to date  Covid-19 vaccine status: Information provided on how to obtain vaccines.   Qualifies for Shingles Vaccine? Yes   Zostavax completed No   Shingrix Completed?: No.    Education has been provided regarding the importance of this vaccine. Patient has been advised to call insurance company to determine out of pocket expense if they have not yet received this vaccine. Advised may also receive vaccine at local pharmacy or Health Dept. Verbalized acceptance and understanding.  Screening Tests Health Maintenance  Topic Date Due   DTaP/Tdap/Td (1 - Tdap) Never done   COVID-19 Vaccine (6 - 2024-25 season) 11/25/2022   Hepatitis C Screening  05/30/2023 (Originally 04/22/1963)   Zoster Vaccines- Shingrix (2 of 2) 07/09/2023 (Originally 12/19/2020)   Medicare Annual Wellness (AWV)  05/12/2024   DEXA SCAN  08/07/2027   Pneumonia Vaccine 38+ Years old  Completed   INFLUENZA VACCINE  Completed   HPV VACCINES  Aged Out   Colonoscopy  Discontinued    Health Maintenance  Health Maintenance  Due  Topic Date Due   DTaP/Tdap/Td (1 - Tdap) Never done   COVID-19 Vaccine (6 - 2024-25 season) 11/25/2022    Colorectal cancer screening: No longer required.   Mammogram status: Completed 08/06/22. Repeat every year  Bone Density status: Completed 08/07/22. Results reflect: Bone density results: OSTEOPENIA. Repeat every 2 years.  Lung Cancer Screening: (Low Dose CT Chest recommended if Age 41-80 years, 20 pack-year currently smoking OR have quit w/in 15years.) does not qualify.   Lung Cancer Screening Referral: n/a  Additional  Screening:  Hepatitis C Screening: does qualify  Vision Screening: Recommended annual ophthalmology exams for early detection of glaucoma and other disorders of the eye. Is the patient up to date with their annual eye exam?  Yes  Who is the provider or what is the name of the office in which the patient attends annual eye exams? Dr. Desiree Lucy  If pt is not established with a provider, would they like to be referred to a provider to establish care? No .   Dental Screening: Recommended annual dental exams for proper oral hygiene  Community Resource Referral / Chronic Care Management: CRR required this visit?  No   CCM required this visit?  No     Plan:     I have personally reviewed and noted the following in the patient's chart:   Medical and social history Use of alcohol, tobacco or illicit drugs  Current medications and supplements including opioid prescriptions. Patient is not currently taking opioid prescriptions. Functional ability and status Nutritional status Physical activity Advanced directives List of other physicians Hospitalizations, surgeries, and ER visits in previous 12 months Vitals Screenings to include cognitive, depression, and falls Referrals and appointments  In addition, I have reviewed and discussed with patient certain preventive protocols, quality metrics, and best practice recommendations. A written personalized care plan for preventive services as well as general preventive health recommendations were provided to patient.     Kandis Fantasia [place], [place]   6/60/6301   After Visit Summary: (MyChart) Due to this being a telephonic visit, the after visit summary with patients personalized plan was offered to patient via MyChart   Nurse Notes: No concerns at this time

## 2023-05-13 NOTE — Patient Instructions (Signed)
Ms. Barcellos , Thank you for taking time to come for your Medicare Wellness Visit. I appreciate your ongoing commitment to your health goals. Please review the following plan we discussed and let me know if I can assist you in the future.   Referrals/Orders/Follow-Ups/Clinician Recommendations: Aim for 30 minutes of exercise or brisk walking, 6-8 glasses of water, and 5 servings of fruits and vegetables each day.  This is a list of the screening recommended for you and due dates:  Health Maintenance  Topic Date Due   DTaP/Tdap/Td vaccine (1 - Tdap) Never done   COVID-19 Vaccine (6 - 2024-25 season) 11/25/2022   Hepatitis C Screening  05/30/2023*   Zoster (Shingles) Vaccine (2 of 2) 07/09/2023*   Medicare Annual Wellness Visit  05/12/2024   DEXA scan (bone density measurement)  08/07/2027   Pneumonia Vaccine  Completed   Flu Shot  Completed   HPV Vaccine  Aged Out   Colon Cancer Screening  Discontinued  *Topic was postponed. The date shown is not the original due date.    Advanced directives: (ACP Link)Information on Advanced Care Planning can be found at [facility] [place] Advance Health Care Directives Advance Health Care Directives ([email or web address]   Next Medicare Annual Wellness Visit scheduled for next year: Yes

## 2023-05-23 DIAGNOSIS — F4323 Adjustment disorder with mixed anxiety and depressed mood: Secondary | ICD-10-CM | POA: Diagnosis not present

## 2023-06-04 ENCOUNTER — Ambulatory Visit: Payer: Medicare PPO | Admitting: Dermatology

## 2023-06-06 DIAGNOSIS — F4323 Adjustment disorder with mixed anxiety and depressed mood: Secondary | ICD-10-CM | POA: Diagnosis not present

## 2023-06-13 DIAGNOSIS — H43813 Vitreous degeneration, bilateral: Secondary | ICD-10-CM | POA: Diagnosis not present

## 2023-06-20 DIAGNOSIS — F4323 Adjustment disorder with mixed anxiety and depressed mood: Secondary | ICD-10-CM | POA: Diagnosis not present

## 2023-06-25 DIAGNOSIS — J301 Allergic rhinitis due to pollen: Secondary | ICD-10-CM | POA: Diagnosis not present

## 2023-06-25 DIAGNOSIS — J320 Chronic maxillary sinusitis: Secondary | ICD-10-CM | POA: Diagnosis not present

## 2023-06-26 ENCOUNTER — Other Ambulatory Visit: Payer: Self-pay | Admitting: Otolaryngology

## 2023-06-26 ENCOUNTER — Encounter: Payer: Self-pay | Admitting: Otolaryngology

## 2023-06-26 DIAGNOSIS — J320 Chronic maxillary sinusitis: Secondary | ICD-10-CM

## 2023-07-02 ENCOUNTER — Ambulatory Visit
Admission: RE | Admit: 2023-07-02 | Discharge: 2023-07-02 | Disposition: A | Discharge status: Home / Self Care | Source: Ambulatory Visit | Attending: Otolaryngology | Admitting: Otolaryngology

## 2023-07-02 DIAGNOSIS — J320 Chronic maxillary sinusitis: Secondary | ICD-10-CM

## 2023-07-04 DIAGNOSIS — F4323 Adjustment disorder with mixed anxiety and depressed mood: Secondary | ICD-10-CM | POA: Diagnosis not present

## 2023-08-01 DIAGNOSIS — F4323 Adjustment disorder with mixed anxiety and depressed mood: Secondary | ICD-10-CM | POA: Diagnosis not present

## 2023-08-15 DIAGNOSIS — F4323 Adjustment disorder with mixed anxiety and depressed mood: Secondary | ICD-10-CM | POA: Diagnosis not present

## 2023-08-29 DIAGNOSIS — F4323 Adjustment disorder with mixed anxiety and depressed mood: Secondary | ICD-10-CM | POA: Diagnosis not present

## 2023-09-05 ENCOUNTER — Ambulatory Visit: Admitting: Dermatology

## 2023-09-12 DIAGNOSIS — F4323 Adjustment disorder with mixed anxiety and depressed mood: Secondary | ICD-10-CM | POA: Diagnosis not present

## 2023-09-14 DIAGNOSIS — S0990XA Unspecified injury of head, initial encounter: Secondary | ICD-10-CM | POA: Diagnosis not present

## 2023-09-14 DIAGNOSIS — Z23 Encounter for immunization: Secondary | ICD-10-CM | POA: Diagnosis not present

## 2023-09-14 DIAGNOSIS — S0181XA Laceration without foreign body of other part of head, initial encounter: Secondary | ICD-10-CM | POA: Diagnosis not present

## 2023-09-26 DIAGNOSIS — F4323 Adjustment disorder with mixed anxiety and depressed mood: Secondary | ICD-10-CM | POA: Diagnosis not present

## 2023-10-15 DIAGNOSIS — F4323 Adjustment disorder with mixed anxiety and depressed mood: Secondary | ICD-10-CM | POA: Diagnosis not present

## 2023-10-25 DIAGNOSIS — F4323 Adjustment disorder with mixed anxiety and depressed mood: Secondary | ICD-10-CM | POA: Diagnosis not present

## 2023-11-07 DIAGNOSIS — F4323 Adjustment disorder with mixed anxiety and depressed mood: Secondary | ICD-10-CM | POA: Diagnosis not present

## 2023-11-13 ENCOUNTER — Ambulatory Visit: Admitting: Physician Assistant

## 2023-11-26 DIAGNOSIS — F4323 Adjustment disorder with mixed anxiety and depressed mood: Secondary | ICD-10-CM | POA: Diagnosis not present

## 2023-12-19 DIAGNOSIS — F4323 Adjustment disorder with mixed anxiety and depressed mood: Secondary | ICD-10-CM | POA: Diagnosis not present

## 2024-01-02 DIAGNOSIS — F4323 Adjustment disorder with mixed anxiety and depressed mood: Secondary | ICD-10-CM | POA: Diagnosis not present

## 2024-01-16 DIAGNOSIS — F4323 Adjustment disorder with mixed anxiety and depressed mood: Secondary | ICD-10-CM | POA: Diagnosis not present

## 2024-02-04 ENCOUNTER — Other Ambulatory Visit: Payer: Self-pay

## 2024-02-04 ENCOUNTER — Emergency Department (HOSPITAL_COMMUNITY)
Admission: EM | Admit: 2024-02-04 | Discharge: 2024-02-04 | Disposition: A | Discharge status: Home / Self Care | Attending: Emergency Medicine | Admitting: Emergency Medicine

## 2024-02-04 ENCOUNTER — Emergency Department (HOSPITAL_COMMUNITY)

## 2024-02-04 ENCOUNTER — Encounter (HOSPITAL_COMMUNITY): Payer: Self-pay | Admitting: *Deleted

## 2024-02-04 DIAGNOSIS — R1030 Lower abdominal pain, unspecified: Secondary | ICD-10-CM

## 2024-02-04 DIAGNOSIS — K5730 Diverticulosis of large intestine without perforation or abscess without bleeding: Secondary | ICD-10-CM | POA: Diagnosis not present

## 2024-02-04 DIAGNOSIS — Z7982 Long term (current) use of aspirin: Secondary | ICD-10-CM | POA: Diagnosis not present

## 2024-02-04 DIAGNOSIS — N390 Urinary tract infection, site not specified: Secondary | ICD-10-CM | POA: Diagnosis not present

## 2024-02-04 DIAGNOSIS — R350 Frequency of micturition: Secondary | ICD-10-CM | POA: Diagnosis not present

## 2024-02-04 DIAGNOSIS — E039 Hypothyroidism, unspecified: Secondary | ICD-10-CM | POA: Diagnosis not present

## 2024-02-04 DIAGNOSIS — R1032 Left lower quadrant pain: Secondary | ICD-10-CM | POA: Insufficient documentation

## 2024-02-04 DIAGNOSIS — N3001 Acute cystitis with hematuria: Secondary | ICD-10-CM | POA: Diagnosis not present

## 2024-02-04 DIAGNOSIS — R9389 Abnormal findings on diagnostic imaging of other specified body structures: Secondary | ICD-10-CM | POA: Diagnosis not present

## 2024-02-04 DIAGNOSIS — R1031 Right lower quadrant pain: Secondary | ICD-10-CM | POA: Insufficient documentation

## 2024-02-04 DIAGNOSIS — Z79899 Other long term (current) drug therapy: Secondary | ICD-10-CM | POA: Insufficient documentation

## 2024-02-04 LAB — COMPREHENSIVE METABOLIC PANEL WITH GFR
ALT: 15 U/L (ref 0–44)
AST: 19 U/L (ref 15–41)
Albumin: 4.5 g/dL (ref 3.5–5.0)
Alkaline Phosphatase: 94 U/L (ref 38–126)
Anion gap: 11 (ref 5–15)
BUN: 12 mg/dL (ref 8–23)
CO2: 26 mmol/L (ref 22–32)
Calcium: 9.4 mg/dL (ref 8.9–10.3)
Chloride: 101 mmol/L (ref 98–111)
Creatinine, Ser: 0.57 mg/dL (ref 0.44–1.00)
GFR, Estimated: >60 mL/min (ref >60)
Glucose, Bld: 99 mg/dL (ref 70–99)
Potassium: 4.2 mmol/L (ref 3.5–5.1)
Sodium: 138 mmol/L (ref 135–145)
Total Bilirubin: 0.3 mg/dL (ref 0.0–1.2)
Total Protein: 7.4 g/dL (ref 6.5–8.1)

## 2024-02-04 LAB — URINALYSIS, ROUTINE W REFLEX MICROSCOPIC
Bilirubin Urine: NEGATIVE
Glucose, UA: NEGATIVE mg/dL
Ketones, ur: NEGATIVE mg/dL
Nitrite: POSITIVE — AB
Protein, ur: 30 mg/dL — AB
RBC / HPF: >50 RBC/hpf (ref 0–5)
Specific Gravity, Urine: 1.005 (ref 1.005–1.030)
WBC, UA: >50 WBC/hpf (ref 0–5)
pH: 7 (ref 5.0–8.0)

## 2024-02-04 LAB — CBC
HCT: 41.8 % (ref 36.0–46.0)
Hemoglobin: 13.8 g/dL (ref 12.0–15.0)
MCH: 29.4 pg (ref 26.0–34.0)
MCHC: 33 g/dL (ref 30.0–36.0)
MCV: 88.9 fL (ref 80.0–100.0)
Platelets: 331 K/uL (ref 150–400)
RBC: 4.7 MIL/uL (ref 3.87–5.11)
RDW: 13.6 % (ref 11.5–15.5)
WBC: 13.4 K/uL — ABNORMAL HIGH (ref 4.0–10.5)
nRBC: 0 % (ref 0.0–0.2)

## 2024-02-04 LAB — LIPASE, BLOOD: Lipase: 27 U/L (ref 11–51)

## 2024-02-04 MED ORDER — KETOROLAC TROMETHAMINE 15 MG/ML IJ SOLN
15.0000 mg | Freq: Once | INTRAMUSCULAR | Status: AC
  Administered 2024-02-04: 15 mg via INTRAMUSCULAR

## 2024-02-04 MED ORDER — KETOROLAC TROMETHAMINE 15 MG/ML IJ SOLN
15.0000 mg | Freq: Once | INTRAMUSCULAR | Status: DC
  Filled 2024-02-04: qty 1

## 2024-02-04 NOTE — Progress Notes (Signed)
 Assessment and Plan:   Assessment & Plan Acute cystitis with hematuria Acute urinary tract infection with urinary frequency Acute UTI confirmed by urinalysis.  Urine culture ordered for antibiotic confirmation. - Prescribed Keflex  500 mg QID for 7 days. - Ordered urine culture. - Advised AZO for up to 48 hours. - Encouraged increased water  intake. - Septums are not improving in 3 to 5 days advised patient to follow-up with primary care provider. Orders: .  cephalexin  (KEFLEX ) 500 MG capsule; Take 1 capsule (500 mg total) by mouth every six (6) hours for 7 days.  Urine frequency  Orders: .  POCT urinalysis dipstick .  Urine Culture      The following portions of the patient's history were reviewed and updated as appropriate: allergies, current medications, past family history, past medical history, past social history, past surgical history and problem list.   Subjective:   Patient ID: Amber Rivas is a 78 y.o. female Chief Complaint  Patient presents with  . Urinary Frequency    Began yesterday-taking AZO since yesterday-none taken this AM yet     History of Present Illness Amber Rivas is a 78 year old female who presents with urinary frequency and burning.Symptoms began yesterday with urinary frequency and dysuria. She experiences lower abdominal pain but denies hematuria, vaginal discharge, or odor. There are no fevers, chills, nausea, or vomiting. She has been taking AZO since yesterday for dysuria relief.She has an allergy to Bactrim, which is relevant for treatment considerations.    Urinary Frequency  This is a new problem. The current episode started yesterday. The problem occurs every urination. The quality of the pain is described as burning. There has been no fever. Associated symptoms include frequency and urgency. Pertinent negatives include no discharge, flank pain, nausea or vomiting. She has tried home medications for the symptoms.  There is no history of recurrent UTIs.    ROS: Negative unless otherwise noted in HPI.  Objective:   Vital Signs:  BP 148/78   Pulse 79   Temp 36.6 C (97.9 F) (Oral)   Resp 16   Ht 165.1 cm (5' 5)   Wt 83.6 kg (184 lb 6.4 oz)   SpO2 96%   BMI 30.69 kg/m   Body mass index is 30.69 kg/m. No LMP recorded. Patient is postmenopausal.  Results for orders placed or performed in visit on 02/04/24  POCT urinalysis dipstick  Result Value Ref Range   Color, UA Yellow    Clarity, UA Clear    Glucose, UA Negative Negative   Bilirubin, UA Negative Negative   Ketones, POC Negative Negative   Spec Grav, UA 1.010 1.005 - 1.030   Blood, UA Moderate (A) Negative   pH, UA 7.0 5.0 - 9.0   Protein, UA Negative Negative   Urobilinogen, UA 0.2 E.U./dL Negative (0.2 mg/dL)   Leukocytes, UA Small (A) Negative   Nitrite, UA Positive (A) Negative   STRIP LOT NUMBER [id number]    STRIP LOT EXPIRATION 10/23/2024      Physical Exam Vitals and nursing note reviewed.  Constitutional:      General: She is not in acute distress.    Appearance: Normal appearance. She is normal weight. She is not ill-appearing or toxic-appearing.  HENT:     Head: Normocephalic.     Right Ear: Tympanic membrane, ear canal and external ear normal.     Left Ear: Tympanic membrane, ear canal and external ear normal.     Nose: Nose  normal.     Mouth/Throat:     Mouth: Mucous membranes are moist.     Pharynx: Oropharynx is clear.  Eyes:     Extraocular Movements: Extraocular movements intact.     Conjunctiva/sclera: Conjunctivae normal.     Pupils: Pupils are equal, round, and reactive to light.  Cardiovascular:     Rate and Rhythm: Normal rate and regular rhythm.     Heart sounds: Normal heart sounds.  Pulmonary:     Effort: Pulmonary effort is normal. No respiratory distress.     Breath sounds: Normal breath sounds.  Abdominal:     General: Abdomen is flat. There is no distension.     Palpations: Abdomen is  soft.     Tenderness: There is no abdominal tenderness. There is no right CVA tenderness or left CVA tenderness.  Musculoskeletal:        General: Normal range of motion.     Cervical back: Normal range of motion.  Skin:    General: Skin is warm and dry.  Neurological:     General: No focal deficit present.     Mental Status: She is alert and oriented to person, place, and time. Mental status is at baseline.  Psychiatric:        Mood and Affect: Mood normal.        Behavior: Behavior normal.        Thought Content: Thought content normal.        Judgment: Judgment normal.       PHQ-2 Score: 0  PHQ-9 Score:    Screening complete, no depression identified / no further action needed today   Follow-up as Needed  and Follow-up with PCP  The use of abridge was used to help in the completion of this note. Patient was educated and verbalized consent of its use.   Disposition Upon Discharge:  1.  Routine symptom specific, illness specific and/or disease specific instructions were discussed with the patient and/or caregiver at length.  2.  The differential diagnosis for the patient's specific symptoms were reviewed and discussed with the patient/caregiver at length; the patient/caregiver expressed understanding of all discussed and their relevant questions were all satisfactorily answered.  3.  Return to care should the presenting symptoms recur, persist, or worsen in any way; or in the alternative, if new symptoms or complaints develop.  4.  The patient and any family present were given verbal and/or written discharge instructions as clinically indicated and appropriate.  5.  Continue OTC medications as needed and tolerated for control of the currently reported symptoms, if no conflict with the currently prescribed medications.

## 2024-02-04 NOTE — ED Notes (Signed)
 Patient transported to CT

## 2024-02-04 NOTE — ED Triage Notes (Signed)
 Pt with c/o abd pain since 1300 today, denies any N/V/D, denies burning with urination, noted increase in urine frequency since yesterday.

## 2024-02-04 NOTE — Discharge Instructions (Addendum)
 Make sure you take the full course of the cephalexin  you are prescribed today.  Come back to the ER if you have new or worsening symptoms, specially if you develop severe pain, fever, vomiting or other worrisome symptoms.  Follow-up closely with your primary care doctor.

## 2024-02-04 NOTE — ED Provider Notes (Signed)
 [name] Provider Note   CSN: [id number] Arrival date & time: 02/04/24  1643     Patient presents with: Abdominal Pain   Amber Rivas is a 78 y.o. female.  She presents the ER today complaining of sharp crampy lower abdominal pain that started around 1 PM.  She states she started having some burning with urination yesterday, she had a long drive so she took some Azo, this morning symptoms were worse so she went to urgent care was diagnosed with UTI and was prescribed Keflex .  She was walking at the pharmacy waiting for prescription to get ready and she developed this abdominal pain she was able to pick up her prescription, but states that pain was severe enough that she could not stand up.  It was constant for several hours until she arrived to the ER, pain is slightly improved now, it does somewhat radiate into the right back.  She denies fever or chills.  She states she had nausea when the pain was at its most severe but no nausea now, she did not have any vomiting.    Abdominal Pain      Prior to Admission medications   Medication Sig Start Date End Date Taking? Authorizing Provider  aspirin  EC 81 MG tablet Take 81 mg by mouth daily as needed for mild pain or moderate pain.     [provider]  B-COMPLEX-C PO Take 1 tablet by mouth daily.    [provider]  buPROPion  (WELLBUTRIN ) 75 MG tablet Take 1 tablet (75 mg total) by mouth 2 (two) times daily. 04/10/23   Jolinda Norene HERO, DO  doxycycline  (VIBRA -TABS) 100 MG tablet Take 1 tablet (100 mg total) by mouth 2 (two) times daily. 05/07/23   Lavell Lye A, FNP  EPINEPHrine  0.3 mg/0.3 mL IJ SOAJ injection Inject 0.3 mg into the muscle as needed for anaphylaxis. 12/27/22   Severa Rock HERO, FNP  levothyroxine  (SYNTHROID ) 100 MCG tablet TAKE 1 TABLET BY MOUTH DAILY 04/30/23   Jolinda Norene M, DO  Multiple Vitamin (MULTIVITAMIN WITH MINERALS) TABS tablet Take 1 tablet  by mouth daily.    [provider]  Omega-3 Fatty Acids (OMEGA 3 PO) Take 1 capsule by mouth daily.     [provider]    Allergies: Elemental sulfur    Review of Systems  Gastrointestinal:  Positive for abdominal pain.    Updated Vital Signs BP (!) 174/92 (BP Location: Left Arm)   Pulse 92   Temp 98.8 F (37.1 C) (Oral)   Resp 20   Ht 5' 4 (1.626 m)   Wt 83 kg   SpO2 93%   BMI 31.41 kg/m   Physical Exam Vitals and nursing note reviewed.  Constitutional:      General: She is not in acute distress.    Appearance: She is well-developed.  HENT:     Head: Normocephalic and atraumatic.  Eyes:     Conjunctiva/sclera: Conjunctivae normal.  Cardiovascular:     Rate and Rhythm: Normal rate and regular rhythm.     Heart sounds: No murmur heard. Pulmonary:     Effort: Pulmonary effort is normal. No respiratory distress.     Breath sounds: Normal breath sounds.  Abdominal:     Palpations: Abdomen is soft.     Tenderness: There is abdominal tenderness in the right lower quadrant and left lower quadrant. There is no right CVA tenderness, left CVA tenderness, guarding or rebound. Negative  signs include Murphy's sign and Rovsing's sign.  Musculoskeletal:        General: No swelling.     Cervical back: Neck supple.  Skin:    General: Skin is warm and dry.     Capillary Refill: Capillary refill takes less than 2 seconds.  Neurological:     General: No focal deficit present.     Mental Status: She is alert and oriented to person, place, and time.  Psychiatric:        Mood and Affect: Mood normal.     (all labs ordered are listed, but only abnormal results are displayed) Labs Reviewed  CBC - Abnormal; Notable for the following components:      Result Value   WBC 13.4 (*)    All other components within normal limits  URINALYSIS, ROUTINE W REFLEX MICROSCOPIC - Abnormal; Notable for the following components:   Color, Urine ORANGE (*)    Hgb urine dipstick  MODERATE (*)    Protein, ur 30 (*)    Nitrite POSITIVE (*)    Leukocytes,Ua MODERATE (*)    Bacteria, UA FEW (*)    All other components within normal limits  URINE CULTURE  LIPASE, BLOOD  COMPREHENSIVE METABOLIC PANEL WITH GFR    EKG: None  Radiology: No results found.   Procedures   Medications Ordered in the ED - No data to display                                  Medical Decision Making This patient presents to the ED for concern of sharp lower abdominal pain, this involves an extensive number of treatment options, and is a complaint that carries with it a high risk of complications and morbidity.  The differential diagnosis includes cystitis, pyelonephritis, ureterolithiasis, diverticulitis, colitis, other   Co morbidities that complicate the patient evaluation :   Hypothyroidism   Additional history obtained:  Additional history obtained from EMR External records from outside source obtained and reviewed including urgent care note and urine dipstick from today's visit for urinary symptoms, prior labs   Lab Tests:  I Ordered, and personally interpreted labs.  The pertinent results include: CBC with white blood cell count 13.4, normal hemoglobin, lipase normal, CMP normal, UA with positive nitrate, moderate leukocytes, greater than 50 red blood cells per high-power field, greater than 50 white blood cells per high-power field and few bacteria   Imaging Studies ordered:  I ordered imaging studies including CT abdomen pelvis which shows no acute findings I independently visualized and interpreted imaging within scope of identifying emergent findings  I agree with the radiologist interpretation     Problem List / ED Course / Critical interventions / Medication management  Sharp bilateral lower abdominal pain with also right flank pain that lasted for several hours and is now improved but not fully resolved, she does have lower abdominal tenderness, no CVA  tenderness.  She is being treated for UTI from urgent care today.  Given her age and persistent tenderness will obtain CT to rule out intra-abdominal process.  UA is negative show diverticulosis without diverticulitis.  Discussed with patient her CT findings and she verbalized understanding, discussed that her labs show UTI with mild leukocytosis but otherwise no acute abnormalities.  Blood pressure was somewhat elevated today but she did not have any chest pain, shortness of breath, numbness ting or weakness, severe headache or other acute symptoms, advised  on follow-up with PCP.  Instructed to finish her course of cephalexin  as prescribed today she has already taken a dose so no further antibiotics given in the ED today.  She does not meet sepsis criteria.  She is advised on close outpatient follow-up and strict return precautions.   I ordered medication including Toradol for pain Reevaluation of the patient after these medicines showed that the patient improved I have reviewed the patients home medicines and have made adjustments as needed       Amount and/or Complexity of Data Reviewed Labs: ordered. Radiology: ordered.  Risk Prescription drug management.        Final diagnoses:  None    ED Discharge Orders     None          Suellen Sherran DELENA DEVONNA 02/04/24 2258    Towana Ozell BROCKS, MD 02/05/24 1007

## 2024-02-06 LAB — URINE CULTURE: Culture: NO GROWTH

## 2024-02-17 ENCOUNTER — Ambulatory Visit: Admitting: Physician Assistant

## 2024-02-27 DIAGNOSIS — F4323 Adjustment disorder with mixed anxiety and depressed mood: Secondary | ICD-10-CM | POA: Diagnosis not present

## 2024-04-15 ENCOUNTER — Ambulatory Visit: Admitting: Nurse Practitioner

## 2024-04-15 ENCOUNTER — Encounter: Payer: Self-pay | Admitting: Nurse Practitioner

## 2024-04-15 ENCOUNTER — Ambulatory Visit: Payer: Self-pay

## 2024-04-15 DIAGNOSIS — R300 Dysuria: Secondary | ICD-10-CM | POA: Diagnosis not present

## 2024-04-15 LAB — URINALYSIS, ROUTINE W REFLEX MICROSCOPIC
Bilirubin, UA: NEGATIVE
Glucose, UA: NEGATIVE
Ketones, UA: NEGATIVE
Leukocytes,UA: NEGATIVE
Nitrite, UA: NEGATIVE
Protein,UA: NEGATIVE
RBC, UA: NEGATIVE
Urobilinogen, Ur: 0.2 mg/dL (ref 0.2–1.0)
pH, UA: 5.5 (ref 5.0–7.5)

## 2024-04-15 NOTE — Telephone Encounter (Signed)
 Apt scheduled.

## 2024-04-15 NOTE — Patient Instructions (Signed)
 Amber Rivas

## 2024-04-15 NOTE — Progress Notes (Signed)
 "                                                                                                         Subjective:  Patient ID: Amber Rivas, female    DOB: 10/21/1945, 79 y.o.   MRN: [id number]  Patient Care Team: Jolinda Norene HERO, DO as PCP - General (Family Medicine) Debera Jayson MATSU, MD as PCP - Cardiology (Cardiology) Dr [facility], OD (Optometry) Octavia, Charlie Hamilton, MD as Consulting Physician (Ophthalmology)   Chief Complaint:  Dysuria (Right shoulder ache)   HPI: Amber Rivas is a 79 y.o. female presenting on 04/15/2024 for Dysuria (Right shoulder ache)   Discussed the use of AI scribe software for clinical note transcription with the patient, who gave verbal consent to proceed.  History of Present Illness Amber Rivas is a 79 year old female who presents with symptoms suggestive of a urinary tract infection and shoulder pain.  She has been experiencing symptoms suggestive of a urinary tract infection for the past two days, including frequent urination both during the day and night, and irritation in the lower urinary tract area. A urine sample has been provided for analysis.  She has acute shoulder pain that began yesterday. The pain is significant, localized to the shoulder, and radiates down her arm, affecting her entire side. She has been managing the pain with Advil and applying heat, while trying to rest the shoulder. She attributes the pain possibly to stress, as she has been under significant emotional strain following the recent deaths of her brother-in-law and nephew, which involved extensive travel and caregiving responsibilities.  She feels tired, sleepy, and generally unwell, which she associates with the recent stress and bereavement.      Relevant past medical, surgical, family, and social history reviewed and updated as indicated.   Allergies and medications reviewed and updated. Data reviewed: Chart in Epic.   Past Medical History:  Diagnosis Date   Arthritis    Cancer (HCC)    Thyroid    History of hiatal hernia    History of pneumonia    Hyperthyroidism    Migraine     Past Surgical History:  Procedure Laterality Date   ANKLE SURGERY Right    BREAST EXCISIONAL BIOPSY Right    over 20 years ago   CATARACT EXTRACTION, BILATERAL     CHOLECYSTECTOMY  2005   COLONOSCOPY N/A 04/28/2015   Procedure: COLONOSCOPY;  Surgeon: Claudis RAYMOND Rivet, MD;  Location: [facility];  Service: Endoscopy;  Laterality: N/A;  730   MOUTH SURGERY     THYROIDECTOMY Left 02/07/2017   Procedure: TOTAL THYROIDECTOMY;  Surgeon: Sheldon Standing, MD;  Location: [facility];  Service: General;  Laterality: Left;  GENERAL AND LOCAL     Social History   Socioeconomic History   Marital status: Married    Spouse name: Carlin   Number of children: 4   Years of education: Bachelors Degree   Highest education level: Bachelor's degree (e.g., BA, AB, BS)  Occupational History   Occupation: Retired  Employer: [employer]  Tobacco Use   Smoking status: Never   Smokeless tobacco: Never  Vaping Use   Vaping status: Never Used  Substance and Sexual Activity   Alcohol use: Not Currently    Comment: rare   Drug use: No   Sexual activity: Yes    Birth control/protection: Post-menopausal  Other Topics Concern   Not on file  Social History Narrative   Son living with them right now while going through his divorce - 04/2021   Social Drivers of Health   Tobacco Use: Low Risk (04/15/2024)   Patient History    Smoking Tobacco Use: Never    Smokeless Tobacco Use: Never    Passive Exposure: Not on file  Financial Resource Strain: Low Risk (05/13/2023)   Overall Financial Resource Strain (CARDIA)    Difficulty of Paying Living Expenses: Not hard at all  Food Insecurity: No Food Insecurity (02/04/2024)   Received from [facility]   Epic    Within the past 12 months, you worried that your food would run out before you got the money to buy more.: Never true    Within the past 12 months, the food you bought just didn't last and you didn't have money to get more.: Never true  Transportation Needs: No Transportation Needs (02/04/2024)   Received from [facility]   PRAPARE - Transportation    Lack of Transportation (Medical): No    Lack of Transportation (Non-Medical): No  Physical Activity: Inactive (05/13/2023)   Exercise Vital Sign    Days of Exercise per Week: 0 days    Minutes of Exercise per Session: 0 min  Stress: No Stress Concern Present (05/13/2023)   [employer] of Occupational Health - Occupational Stress Questionnaire    Feeling of Stress : Not at all  Social Connections: Unknown (05/13/2023)   Social Connection and Isolation Panel    Frequency of Communication with Friends and Family: More than three times a week    Frequency of Social Gatherings with Friends and Family: Patient declined    Attends Religious Services: Patient declined    Database Administrator or Organizations: Patient declined    Attends Banker Meetings: Patient declined    Marital Status: Married  Catering Manager Violence: Not At Risk (05/13/2023)   Humiliation, Afraid, Rape, and Kick questionnaire    Fear of Current or Ex-Partner: No    Emotionally Abused: No    Physically Abused: No    Sexually Abused: No  Depression (PHQ2-9): Medium Risk (04/15/2024)   Depression (PHQ2-9)    PHQ-2 Score: 5  Alcohol Screen: Low Risk (05/13/2023)   Alcohol Screen    Last Alcohol Screening Score (AUDIT): 0  Housing: Unknown (05/13/2023)   Housing Stability Vital Sign    Unable to Pay for Housing in the Last Year: No    Number of Times Moved in the Last Year: Not on file    Homeless in the Last Year: No  Utilities: Low Risk (02/04/2024)   Received from [facility]   Utilities    Within the past 12 months, have  you been unable to get utilities(heat, electricity) when it was really needed?: No  Health Literacy: Adequate Health Literacy (05/13/2023)   B1300 Health Literacy    Frequency of need for help with medical instructions: Never    Outpatient Encounter Medications as of 04/15/2024  Medication Sig   aspirin  EC 81 MG tablet Take 81 mg by mouth daily  as needed for mild pain or moderate pain.    B-COMPLEX-C PO Take 1 tablet by mouth daily.   buPROPion  (WELLBUTRIN ) 75 MG tablet Take 1 tablet (75 mg total) by mouth 2 (two) times daily.   doxycycline  (VIBRA -TABS) 100 MG tablet Take 1 tablet (100 mg total) by mouth 2 (two) times daily.   EPINEPHrine  0.3 mg/0.3 mL IJ SOAJ injection Inject 0.3 mg into the muscle as needed for anaphylaxis.   levothyroxine  (SYNTHROID ) 100 MCG tablet TAKE 1 TABLET BY MOUTH DAILY   Multiple Vitamin (MULTIVITAMIN WITH MINERALS) TABS tablet Take 1 tablet by mouth daily.   Omega-3 Fatty Acids (OMEGA 3 PO) Take 1 capsule by mouth daily.    No facility-administered encounter medications on file as of 04/15/2024.    Allergies[1]  Pertinent ROS per HPI, otherwise unremarkable      Objective:  BP 138/80   Pulse 75   Temp 98.2 F (36.8 C)   Ht 5' 4 (1.626 m)   Wt 186 lb 14.4 oz (84.8 kg)   SpO2 96%   BMI 32.08 kg/m    Wt Readings from Last 3 Encounters:  04/15/24 186 lb 14.4 oz (84.8 kg)  02/04/24 183 lb (83 kg)  05/13/23 171 lb (77.6 kg)   BP Readings from Last 3 Encounters:  04/15/24 138/80  02/04/24 (!) 174/92  04/10/23 (!) 146/81     Physical Exam Vitals and nursing note reviewed.  Constitutional:      General: She is not in acute distress. HENT:     Head: Normocephalic and atraumatic.     Nose: Nose normal.     Mouth/Throat:     Mouth: Mucous membranes are moist.  Eyes:     Extraocular Movements: Extraocular movements intact.     Conjunctiva/sclera: Conjunctivae normal.     Pupils: Pupils are equal, round, and reactive to light.  Cardiovascular:      Heart sounds: Normal heart sounds.  Pulmonary:     Effort: Pulmonary effort is normal.     Breath sounds: Normal breath sounds.  Abdominal:     General: Bowel sounds are normal.     Palpations: Abdomen is soft.     Tenderness: There is no right CVA tenderness or left CVA tenderness.  Musculoskeletal:        General: Normal range of motion.     Left lower leg: No edema.  Skin:    General: Skin is warm and dry.     Findings: No rash.  Neurological:     Mental Status: She is alert.  Psychiatric:        Mood and Affect: Mood normal.        Behavior: Behavior normal.        Thought Content: Thought content normal.    Physical Exam MEASUREMENTS: Weight- 186.   Urine dipstick shows negative for all components.  Micro exam: not done.   Results for orders placed or performed during the hospital encounter of 02/04/24  Urine Culture   Collection Time: 02/04/24  4:53 PM   Specimen: Urine, Clean Catch  Result Value Ref Range   Specimen Description      URINE, CLEAN CATCH Performed at [facility], [address] [place], [place] [address]    Special Requests      NONE Performed at [facility], [address], [place], [place] [address]    Culture      NO GROWTH Performed at [facility], [address] [place], [place]  [address]    Report Status 02/06/2024 FINAL   Urinalysis, Routine w reflex microscopic -Urine, Clean Catch   Collection Time: 02/04/24  4:53 PM  Result Value Ref Range   Color, Urine ORANGE (A) YELLOW   APPearance CLEAR CLEAR   Specific Gravity, Urine 1.005 1.005 - 1.030   pH 7.0 5.0 - 8.0   Glucose, UA NEGATIVE NEGATIVE mg/dL   Hgb urine dipstick MODERATE (A) NEGATIVE   Bilirubin Urine NEGATIVE NEGATIVE   Ketones, ur NEGATIVE NEGATIVE mg/dL   Protein, ur 30 (A) NEGATIVE mg/dL   Nitrite POSITIVE (A) NEGATIVE   Leukocytes,Ua MODERATE (A) NEGATIVE   RBC / HPF >50 0 - 5 RBC/hpf   WBC, UA >50 0 - 5 WBC/hpf   Bacteria, UA FEW (A) NONE SEEN    Squamous Epithelial / HPF 0-5 0 - 5 /HPF  Lipase, blood   Collection Time: 02/04/24  5:27 PM  Result Value Ref Range   Lipase 27 11 - 51 U/L  Comprehensive metabolic panel   Collection Time: 02/04/24  5:27 PM  Result Value Ref Range   Sodium 138 135 - 145 mmol/L   Potassium 4.2 3.5 - 5.1 mmol/L   Chloride 101 98 - 111 mmol/L   CO2 26 22 - 32 mmol/L   Glucose, Bld 99 70 - 99 mg/dL   BUN 12 8 - 23 mg/dL   Creatinine, Ser 9.42 0.44 - 1.00 mg/dL   Calcium 9.4 8.9 - 89.6 mg/dL   Total Protein 7.4 6.5 - 8.1 g/dL   Albumin 4.5 3.5 - 5.0 g/dL   AST 19 15 - 41 U/L   ALT 15 0 - 44 U/L   Alkaline Phosphatase 94 38 - 126 U/L   Total Bilirubin 0.3 0.0 - 1.2 mg/dL   GFR, Estimated >39 >39 mL/min   Anion gap 11 5 - 15  CBC   Collection Time: 02/04/24  5:27 PM  Result Value Ref Range   WBC 13.4 (H) 4.0 - 10.5 K/uL   RBC 4.70 3.87 - 5.11 MIL/uL   Hemoglobin 13.8 12.0 - 15.0 g/dL   HCT 58.1 63.9 - 53.9 %   MCV 88.9 80.0 - 100.0 fL   MCH 29.4 26.0 - 34.0 pg   MCHC 33.0 30.0 - 36.0 g/dL   RDW 86.3 88.4 - 84.4 %   Platelets 331 150 - 400 K/uL   nRBC 0.0 0.0 - 0.2 %       Pertinent labs & imaging results that were available during my care of the patient were reviewed by me and considered in my medical decision making.  Assessment & Plan:  Amber Rivas was seen today for dysuria.  Diagnoses and all orders for this visit:  Dysuria -     Urinalysis, Routine w reflex microscopic -     Urine Culture     Assessment and Plan Amber Rivas is a 79 year old Caucasian female seen today for dysuria, no acute distress Assessment & Plan Urinary tract infection Urinalysis negative for infection. - Ordered urine culture. - Okay to take over-the-counter AZO if the pressure continue  Right shoulder pain Acute pain possibly stress-related, radiating down the arm. - Continue ibuprofen as needed. - Apply heat to shoulder. - Rest shoulder. - Follow up with PCP if pain persists.      Continue all other  maintenance medications.  Follow up plan: Return if symptoms worsen or fail to improve.   Continue healthy lifestyle choices, including diet (rich in fruits, vegetables, and lean proteins, and  low in salt and simple carbohydrates) and exercise (at least 30 minutes of moderate physical activity daily).  Educational handout given for   Dysuria Dysuria is pain or discomfort when you pee. The pain may be felt in your urethra, which is the part of your body that drains pee (urine) from your bladder. The pain may also be felt near your genitals, groin, or in your lower belly or back. You may have to pee often or have the sudden feeling that you need to pee. This condition can affect anyone, but it's more common in females. It can be caused by: A urinary tract infection (UTI). Kidney stones or bladder stones. Some sexually transmitted infections (STIs). Dehydration. This is when there's not enough water  in your body. Irritation and swelling in the vagina. The use of some medicines. The use of some soaps or products with a scent. Follow these instructions at home: Medicines  Take your medicines only as told. Take your antibiotics as told. Do not stop taking them even if you start to feel better. Eating and drinking Drink enough fluid to keep your pee pale yellow. Certain drinks can make the pain worse. Avoid: Drinks with caffeine  in them. Tea. Alcohol. In males, alcohol may irritate the prostate. General instructions Watch your condition for any changes, such as color changes in your pee. Pee often. Do not hold your pee for a long time. If you're female, wipe from front to back after you pee or poop. Use each tissue only once when you wipe. Pee after you have sex. If you've had any tests done, it's up to you to get your test results. Ask your health care provider, or the department doing the test, when your results will be ready. Contact a health care provider if: You have a fever or  chills. You have pain in your back or sides. You throw up or feel like you may throw up. You have blood in your pee. You're not peeing as often as normal. You feel very weak. Get help right away if: You have very bad pain that doesn't get better with medicine. You're confused. You have a fast heartbeat while resting. This information is not intended to replace advice given to you by your health care provider. Make sure you discuss any questions you have with your health care provider. Document Revised: 07/17/2022 Document Reviewed: 07/17/2022 Elsevier Patient Education  2024 Elsevier Inc.    The above assessment and management plan was discussed with the patient. The patient verbalized understanding of and has agreed to the management plan. Patient is aware to call the clinic if they develop any new symptoms or if symptoms persist or worsen. Patient is aware when to return to the clinic for a follow-up visit. Patient educated on when it is appropriate to go to the emergency department.   Rmani Kapusta [place] Thompson, DNP [facility] [address] [place], [place] [address] [phone]      [1]  Allergies Allergen Reactions   Elemental Sulfur Anaphylaxis   "

## 2024-04-15 NOTE — Telephone Encounter (Signed)
" °  FYI Only or Action Required?: Action required by provider: request for appointment.  Patient was last seen in primary care on 05/07/2023 by Lavell Bari LABOR, FNP.  Called Nurse Triage reporting Urinary symptoms.  Symptoms began several days ago.  Interventions attempted: Nothing.  Symptoms are: gradually worsening.Pt. Has urinary frequency, pain. Also having right shoulder pain x yesterday.  Triage Disposition: See Physician Within 24 Hours  Patient/caregiver understands and will follow disposition?: Yes     Reason for Triage: Patient has a possible UTI & right shoulder pain. States she's having burning with urination and frequency. No fever. Pain in the right shoulder at the shoulder blade. Started yesterday when she woke up.    Reason for Disposition  Urinating more frequently than usual (i.e., frequency) OR new-onset of the feeling of an urgent need to urinate (i.e., urgency)  Answer Assessment - Initial Assessment Questions 1. SYMPTOM: What's the main symptom you're concerned about? (e.g., frequency, incontinence)     Frequency, pain 2. ONSET: When did the    start?     2 days 3. PAIN: Is there any pain? If Yes, ask: How bad is it? (Scale: 1-10; mild, moderate, severe)     moderate 4. CAUSE: What do you think is causing the symptoms?     UTI 5. OTHER SYMPTOMS: Do you have any other symptoms? (e.g., blood in urine, fever, flank pain, pain with urination)     NO 6. PREGNANCY: Is there any chance you are pregnant? When was your last menstrual period?     NO  Protocols used: Urinary Symptoms-A-AH  "

## 2024-04-17 ENCOUNTER — Ambulatory Visit: Payer: Self-pay | Admitting: Nurse Practitioner

## 2024-04-17 LAB — URINE CULTURE

## 2024-04-23 ENCOUNTER — Telehealth: Payer: Self-pay

## 2024-04-23 NOTE — Telephone Encounter (Signed)
 Copied from CRM #[id number]. Topic: Clinical - Lab/Test Results >> Apr 23, 2024 11:50 AM Emylou G wrote: Reason for CRM: Patient calling interested in getting labs done before her 3/10 appt

## 2024-04-24 ENCOUNTER — Other Ambulatory Visit: Payer: Self-pay | Admitting: Family Medicine

## 2024-04-24 DIAGNOSIS — E890 Postprocedural hypothyroidism: Secondary | ICD-10-CM

## 2024-04-24 MED ORDER — LEVOTHYROXINE SODIUM 100 MCG PO TABS: 100.0000 ug | ORAL_TABLET | Freq: Every day | ORAL | 0 refills | Status: AC

## 2024-04-24 NOTE — Telephone Encounter (Signed)
 Left detailed message making patient aware of this.

## 2024-04-24 NOTE — Telephone Encounter (Signed)
 Yes, rx sent to last through appt.

## 2024-04-24 NOTE — Telephone Encounter (Signed)
 Spoke with patient and lab appt has been scheduled. She says she will be out of her thyroid  medicine before her appt in march. Wants to know if an additional refill can be sent in to last her until her appt?

## 2024-04-26 ENCOUNTER — Other Ambulatory Visit: Payer: Self-pay | Admitting: Family Medicine

## 2024-04-26 DIAGNOSIS — E890 Postprocedural hypothyroidism: Secondary | ICD-10-CM

## 2024-05-29 ENCOUNTER — Other Ambulatory Visit

## 2024-06-02 ENCOUNTER — Ambulatory Visit: Admitting: Family Medicine
